# Patient Record
Sex: Female | Born: 1985 | Hispanic: Yes | Marital: Married | Smoking: Never smoker
Health system: Southern US, Community
[De-identification: ages and names within clinical notes are randomized; demographics above are authoritative.]

## PROBLEM LIST (undated history)

## (undated) DIAGNOSIS — R011 Cardiac murmur, unspecified: Secondary | ICD-10-CM

## (undated) DIAGNOSIS — N159 Renal tubulo-interstitial disease, unspecified: Secondary | ICD-10-CM

## (undated) DIAGNOSIS — A048 Other specified bacterial intestinal infections: Secondary | ICD-10-CM

## (undated) DIAGNOSIS — I10 Essential (primary) hypertension: Secondary | ICD-10-CM

## (undated) HISTORY — DX: Other specified bacterial intestinal infections: A04.8

## (undated) HISTORY — PX: APPENDECTOMY: SHX54

---

## 2013-06-04 ENCOUNTER — Emergency Department (HOSPITAL_COMMUNITY)
Admission: EM | Admit: 2013-06-04 | Discharge: 2013-06-04 | Disposition: A | Discharge status: Home / Self Care | Payer: Self-pay | Attending: Emergency Medicine | Admitting: Emergency Medicine

## 2013-06-04 ENCOUNTER — Encounter (HOSPITAL_COMMUNITY): Payer: Self-pay | Admitting: Emergency Medicine

## 2013-06-04 DIAGNOSIS — R51 Headache: Secondary | ICD-10-CM | POA: Insufficient documentation

## 2013-06-04 DIAGNOSIS — R197 Diarrhea, unspecified: Secondary | ICD-10-CM | POA: Insufficient documentation

## 2013-06-04 DIAGNOSIS — Z8742 Personal history of other diseases of the female genital tract: Secondary | ICD-10-CM | POA: Insufficient documentation

## 2013-06-04 DIAGNOSIS — R5383 Other fatigue: Secondary | ICD-10-CM

## 2013-06-04 DIAGNOSIS — Z880 Allergy status to penicillin: Secondary | ICD-10-CM | POA: Insufficient documentation

## 2013-06-04 DIAGNOSIS — IMO0001 Reserved for inherently not codable concepts without codable children: Secondary | ICD-10-CM | POA: Insufficient documentation

## 2013-06-04 DIAGNOSIS — R509 Fever, unspecified: Secondary | ICD-10-CM | POA: Insufficient documentation

## 2013-06-04 DIAGNOSIS — R5381 Other malaise: Secondary | ICD-10-CM | POA: Insufficient documentation

## 2013-06-04 DIAGNOSIS — R011 Cardiac murmur, unspecified: Secondary | ICD-10-CM | POA: Insufficient documentation

## 2013-06-04 DIAGNOSIS — J069 Acute upper respiratory infection, unspecified: Secondary | ICD-10-CM | POA: Insufficient documentation

## 2013-06-04 HISTORY — DX: Renal tubulo-interstitial disease, unspecified: N15.9

## 2013-06-04 HISTORY — DX: Cardiac murmur, unspecified: R01.1

## 2013-06-04 MED ORDER — LORATADINE-PSEUDOEPHEDRINE ER 5-120 MG PO TB12: 1.0000 | ORAL_TABLET | Freq: Two times a day (BID) | ORAL | Status: DC

## 2013-06-04 MED ORDER — HYDROCOD POLST-CHLORPHEN POLST 10-8 MG/5ML PO LQCR
5.0000 mL | Freq: Once | ORAL | Status: AC
Start: 2013-06-04 — End: 2013-06-04
  Administered 2013-06-04: 5 mL via ORAL
  Filled 2013-06-04: qty 5

## 2013-06-04 MED ORDER — IBUPROFEN 800 MG PO TABS
800.0000 mg | ORAL_TABLET | Freq: Once | ORAL | Status: AC
  Administered 2013-06-04: 800 mg via ORAL
  Filled 2013-06-04: qty 1

## 2013-06-04 MED ORDER — PROMETHAZINE-CODEINE 6.25-10 MG/5ML PO SYRP: 5.0000 mL | ORAL_SOLUTION | ORAL | Status: DC | PRN

## 2013-06-04 NOTE — ED Notes (Signed)
Flu like sx x 3 days with fever, cough, body aches, sore throat.

## 2013-06-04 NOTE — ED Provider Notes (Signed)
CSN: [id number]     Arrival date & time 06/04/13  1001 History   First MD Initiated Contact with Patient 06/04/13 1107     Chief Complaint  Patient presents with  . flu like sx    (Consider location/radiation/quality/duration/timing/severity/associated sxs/prior Treatment) Patient is a 28 y.o. female presenting with flu symptoms. The history is provided by the patient.  Influenza Presenting symptoms: cough, diarrhea, fatigue, fever, headache, myalgias and sore throat   Presenting symptoms: no shortness of breath   Severity:  Moderate Onset quality:  Gradual Duration:  3 days Progression:  Worsening Chronicity:  New Relieved by:  Nothing Ineffective treatments:  OTC medications Associated symptoms: chills, decreased appetite and nasal congestion   Risk factors: sick contacts     Past Medical History  Diagnosis Date  . Kidney infection   . Heart murmur     childhood   Past Surgical History  Procedure Laterality Date  . Appendectomy     No family history on file. History  Substance Use Topics  . Smoking status: Never Smoker   . Smokeless tobacco: Not on file  . Alcohol Use: No   OB History   Grav Para Term Preterm Abortions TAB SAB Ect Mult Living                 Review of Systems  Constitutional: Positive for fever, chills, fatigue and decreased appetite. Negative for activity change.       All ROS Neg except as noted in HPI  HENT: Positive for congestion and sore throat. Negative for nosebleeds.   Eyes: Negative for photophobia and discharge.  Respiratory: Positive for cough. Negative for shortness of breath and wheezing.   Cardiovascular: Negative for chest pain and palpitations.  Gastrointestinal: Positive for diarrhea. Negative for abdominal pain and blood in stool.  Genitourinary: Negative for dysuria, frequency and hematuria.  Musculoskeletal: Positive for myalgias. Negative for arthralgias, back pain and neck pain.  Skin: Negative.   Neurological: Positive  for headaches. Negative for dizziness, seizures and speech difficulty.  Psychiatric/Behavioral: Negative for hallucinations and confusion.    Allergies  Penicillins  Home Medications   Current Outpatient Rx  Name  Route  Sig  Dispense  Refill  . Pseudoeph-Doxylamine-DM-APAP (NYQUIL PO)   Oral   Take by mouth at bedtime as needed (cold/fever).          BP 120/57  Pulse 105  Temp(Src) 98 F (36.7 C) (Oral)  Resp 16  SpO2 100%  LMP 05/09/2013 Physical Exam  Nursing note and vitals reviewed. Constitutional: She is oriented to person, place, and time. She appears well-developed and well-nourished.  Non-toxic appearance.  HENT:  Head: Normocephalic.  Right Ear: Tympanic membrane and external ear normal.  Left Ear: Tympanic membrane and external ear normal.  Nasal congestion present  Uvula slightly enlarged. Airway patent  Eyes: EOM and lids are normal. Pupils are equal, round, and reactive to light.  Neck: Normal range of motion. Neck supple. Carotid bruit is not present.  Cardiovascular: Normal rate, regular rhythm, normal heart sounds, intact distal pulses and normal pulses.   Pulmonary/Chest: Breath sounds normal. No respiratory distress. She has no wheezes. She has no rales.  cpurse breath sounds. Pt speaks in complete sentences.  Abdominal: Soft. Bowel sounds are normal. There is no tenderness. There is no guarding.  Musculoskeletal: Normal range of motion.  Lymphadenopathy:       Head (right side): No submandibular adenopathy present.       Head (left side):  No submandibular adenopathy present.    She has no cervical adenopathy.  Neurological: She is alert and oriented to person, place, and time. She has normal strength. No cranial nerve deficit or sensory deficit.  Skin: Skin is warm and dry.  Psychiatric: She has a normal mood and affect. Her speech is normal.    ED Course  Procedures (including critical care time) Labs Review Labs Reviewed - No data to  display Imaging Review No results found.  EKG Interpretation   None       MDM  No diagnosis found. *I have reviewed nursing notes, vital signs, and all appropriate lab and imaging results for this patient.**  Vital signs stable. Suspect URI.Rx for claritin D and promethazine cough medication given. Pt will use tylenol or ibuprofen for fever and aching. She is to return if any changes or problem.  Kathie Dike, PA-C 06/06/13 1517

## 2013-06-04 NOTE — Discharge Instructions (Signed)
Infección de las vías aéreas superiores en los adultos  (Upper Respiratory Infection, Adult)   La infección respiratoria de las vías aéreas superiores se conoce también como resfrío común. Las vías aéreas superiores incluyen los senos nasales, la garganta, la tráquea, y los bronquios. Los bronquios son las vías aéreas que conducen el aire a los pulmones. La mayor parte de las personas mejora luego de una semana, pero los síntomas pueden durar hasta dos semanas. La tos residual puede durar más.  CAUSAS  Varios tipos de virus pueden causar la infección de los tejidos que cubren las vías aéreas superiores. Los tejidos se irritan y se inflaman y se originan secreciones. También es frecuente la producción de moco. El resfrío es contagioso. El virus se disemina fácilmente a otras personas por contacto oral. Aquí se incluyen los besos, el compartir un vaso y el toser o estornudar. También puede diseminarse tocándose la boca o la nariz y luego tocando una superficie que luego tocan otras personas.   SÍNTOMAS  Los síntomas se desarrollan entre uno y tres días luego de entrar en contacto con el virus. Pueden variar de una persona a otra. Incluyen:  · Secreción nasal.  · Estornudos  · Congestión nasal.  · Irritación de los senos nasales.  · Dolor de garganta.  · Pérdida de la voz (laringitis).  · Tos.  · Fatiga.  · Dolores musculares.  · Pérdida del apetito.  · Dolor de cabeza.  · Fiebre no muy elevada.  DIAGNÓSTICO  Puede diagnosticarse a sí mismo la infección respiratoria, según los síntomas habituales, ya que la mayor parte de las personas se resfría dos o tres veces al año. El profesional puede confirmarlo basándose en el examen físico. Lo más importante es que el profesional verifique que los síntomas no se deben a otra enfermedad como anginas, sinusitis, neumonía, asma o epiglotitis. Para diagnosticar el resfrio común, no es necesario que haga análisis de sangre, pruebas en la garganta o radiografías, pero en algunos  casos puede ser de utilidad para excluir otros problemas más graves. El médico decidirá si necesita otras pruebas.  RIESGOS Y COMPLICACIONES  Tendrá mayor riesgo de sufrir un resfrío grave si consume cigarrillos, sufre una enfermedad cardíaca (como insuficiencia cardíaca) o pulmonar crónica (como asma) o si tiene un debilitamiento del sistema inmunológico. Las personas muy jóvenes o muy mayores tienen riesgo de sufrir infecciones más graves. La sinusitis bacteriana, las infecciones del oído medio y la neumonía bacteriana pueden complicar el resfrío común. El resfrío puede exacerbar el asma y la enfermedad pulmonar obstructiva crónica. En algunos casos estas complicaciones requieren la atención en un servicio de emergencias y pueden poner en peligro la vida.  PREVENCIÓN  La mejor manera de protegerse para no contraer un resfrío es mantener una buena higiene. Evite el contacto bucal o de las manos con personas con síntomas de resfrío. Si se produce el contacto, lávese las manos con frecuencia. No hay pruebas firmes que indiquen que la vitamina C, la vitamina E, la equinácea o la actividad física reduzcan las posibilidades de tener una infección. Sin embargo, siempre se recomienda descansar mucho y tener una buena nutrición.  TRATAMIENTO  El tratamiento está dirigido a aliviar los síntomas. Esta enfermedad no tiene cura. Los antibióticos no son eficaces, ya que esta infección la causa un virus y no una bacteria. El tratamiento incluye:  · Aumente la ingesta de líquidos. Consumo de bebidas deportivas, que proporcionan electrolitos,azúcares e hidratación.  · Inhale vapor caliente (de un vaporizador o de   la ducha).  · Tomar sopa de pollo u otros líquidos claros, y mantener una buena nutrición.  · Descanse lo suficiente.  · Haga gárgaras o coma pastillas para aliviar las molestias.  · Control de la fiebre con ibuprofeno o acetaminofen, según las indicaciones del médico.  · Aumento del uso del inhalador, si sufre asma.  Las  pastillas y los geles de zinc durante las primeras 24 horas de iniciado el resfrío común, pueden disminuir la duración y aliviar la gravedad de los síntomas. Los medicamentos para el dolor pueden disminuir la fiebre, aliviar los dolores musculares y el dolor de garganta. Se dispone de una gran variedad de medicamentos de venta libre para tratar la congestión y la secreción nasal. El profesional podrá recomendarle inhalantes para los otros síntomas.  INSTRUCCIONES PARA EL CUIDADO DOMICILIARIO  · Utilice los medicamentos de venta libre o de prescripción para el dolor, el malestar o la fiebre, según se lo indique el profesional que lo asiste.  · Utilice un vaporizador caliente o inhale vapor, haciendo salir agua de la ducha para aumentar la humedad ambiente. Esto mantendrá las secreciones húmedas y le resultará más fácil respirar.  · Beba gran cantidad de líquido para mantener la orina de tono claro o color amarillo pálido.  · Descanse todo lo que pueda.  · Regrese a su trabajo cuando la temperatura se haya normalizado, o cuando el profesional que lo asiste se lo indique. Quizás sea necesario que permanezca en su casa durante un tiempo más prolongado para evitar infectar a otras personas. También puede utilizar un barbijo y ser cuidadoso con el lavado de manos para evitar la diseminación del virus.  SOLICITE ATENCIÓN MÉDICA SI:  · Luego de los primeros días siente que empeora en vez de mejorar.  · Necesita que el profesional le brinde más información relacionada con los medicamentos para controlar los síntomas.  · Siente escalofríos, le falta el aire o escupe moco de color marrón o rojo. Estos pueden ser síntomas de neumonía.  · Tiene una secreción nasal de color amarillo o marrón, o siente dolor en el rostro, especialmente cuando se inclina hacia adelante. Estos pueden ser síntomas de sinusitis.  · Tiene fiebre, siente el cuello hinchado, tiene dolor al tragar u observa manchas blancas en el fondo de la garganta.  Estos pueden ser síntomas de angina por estreptococo.  SOLICITE ATENCIÓN MÉDICA DE INMEDIATO SI:  · Tiene fiebre.  · Comienza a sentir un dolor de cabeza intenso o persistente, dolor de oídos, en el seno nasal o en el pecho.  · Tiene tos y esta se prolonga demasiado, tose y escupe sangre, la mucosidad habitual se modifica (si tiene una enfermedad pulmonar crónica) o respira con dificultad.  · Siente rigidez en el cuello o dolor de cabeza intenso.  Document Released: 02/02/2005 Document Revised: 07/18/2011  ExitCare® Patient Information ©2014 [employer].

## 2013-06-06 NOTE — ED Provider Notes (Signed)
Medical screening examination/treatment/procedure(s) were performed by non-physician practitioner and as supervising physician I was immediately available for consultation/collaboration.  EKG Interpretation   None         Laray AngerKathleen M Zuri Bradway, DO 06/06/13 2132

## 2014-12-20 ENCOUNTER — Inpatient Hospital Stay (HOSPITAL_COMMUNITY)
Admission: AD | Admit: 2014-12-20 | Discharge: 2014-12-20 | Disposition: A | Discharge status: Home / Self Care | Payer: Self-pay | Source: Ambulatory Visit | Attending: Obstetrics & Gynecology | Admitting: Obstetrics & Gynecology

## 2014-12-20 DIAGNOSIS — G43009 Migraine without aura, not intractable, without status migrainosus: Secondary | ICD-10-CM

## 2014-12-20 DIAGNOSIS — Z880 Allergy status to penicillin: Secondary | ICD-10-CM | POA: Insufficient documentation

## 2014-12-20 LAB — URINALYSIS, ROUTINE W REFLEX MICROSCOPIC
BILIRUBIN URINE: NEGATIVE
GLUCOSE, UA: NEGATIVE mg/dL
HGB URINE DIPSTICK: NEGATIVE
Ketones, ur: NEGATIVE mg/dL
NITRITE: NEGATIVE
Protein, ur: NEGATIVE mg/dL
UROBILINOGEN UA: 0.2 mg/dL (ref 0.0–1.0)
pH: 6 (ref 5.0–8.0)

## 2014-12-20 LAB — URINE MICROSCOPIC-ADD ON

## 2014-12-20 LAB — CBC
HEMATOCRIT: 38.9 % (ref 36.0–46.0)
HEMOGLOBIN: 13.1 g/dL (ref 12.0–15.0)
MCH: 32.4 pg (ref 26.0–34.0)
MCHC: 33.7 g/dL (ref 30.0–36.0)
MCV: 96.3 fL (ref 78.0–100.0)
Platelets: 250 10*3/uL (ref 150–400)
RBC: 4.04 MIL/uL (ref 3.87–5.11)
RDW: 12.9 % (ref 11.5–15.5)
WBC: 6.6 10*3/uL (ref 4.0–10.5)

## 2014-12-20 LAB — POCT PREGNANCY, URINE: PREG TEST UR: NEGATIVE

## 2014-12-20 MED ORDER — IBUPROFEN 600 MG PO TABS: 600.0000 mg | ORAL_TABLET | Freq: Four times a day (QID) | ORAL | Status: DC | PRN

## 2014-12-20 MED ORDER — PROMETHAZINE HCL 25 MG/ML IJ SOLN
25.0000 mg | INTRAMUSCULAR | Status: AC
  Administered 2014-12-20: 25 mg via INTRAMUSCULAR
  Filled 2014-12-20: qty 1

## 2014-12-20 MED ORDER — KETOROLAC TROMETHAMINE 60 MG/2ML IM SOLN
60.0000 mg | Freq: Once | INTRAMUSCULAR | Status: AC
  Administered 2014-12-20: 60 mg via INTRAMUSCULAR
  Filled 2014-12-20: qty 2

## 2014-12-20 NOTE — MAU Note (Signed)
Pt has had a headache since yesterday, fainted once yesterday and fell twice.  Headache still present and feels faint and dizzy.  Rates pain 8/10.  Denies and vaginal complaints and states she does not think she is pregnant.  Does not have a history of headaches.

## 2014-12-20 NOTE — Discharge Instructions (Signed)
Cefalea migrañosa °(Migraine Headache) °Una cefalea migrañosa es un dolor muy intenso y punzante en uno o ambos lados de la cabeza. Hable con su médico sobre los factores que pueden causar (desencadenar) las cefaleas migrañosas. °CUIDADOS EN EL HOGAR °· Tome solo los medicamentos según le haya indicado el médico. °· Cuando tenga la migraña, acuéstese en un cuarto oscuro y tranquilo °· Lleve un registro diario para averiguar si hay ciertas cosas que le provocan la cefalea migrañosa. Por ejemplo, escriba: °¨ Lo que usted come y bebe. °¨ Cuánto tiempo duerme. °¨ Algún cambio en su dieta o en los medicamentos. °· Beba menos alcohol. °· Si fuma, deje de hacerlo. °· Duerma lo suficiente. °· Disminuya todo tipo de estrés de la vida diaria. °· Mantenga las luces tenues si le molestan las luces brillantes o hacen que la migraña empeore. °SOLICITE AYUDA DE INMEDIATO SI:  °· La migraña empeora. °· Tiene fiebre. °· Presenta rigidez en el cuello. °· Tiene dificultad para ver. °· Sus músculos están débiles, o pierde el control muscular. °· Pierde el equilibrio o tiene problemas para caminar. °· Siente que se desvanece (debilidad) o se desmaya. °· Tiene malos síntomas que son diferentes a los primeros síntomas. °ASEGÚRESE DE QUE:  °· Comprende estas instrucciones. °· Controlará su afección. °· Recibirá ayuda de inmediato si no mejora o si empeora. °Document Released: 07/22/2008 Document Revised: 04/30/2013 °ExitCare® Patient Information ©2015 [employer]. This information is not intended to replace advice given to you by your health care provider. Make sure you discuss any questions you have with your health care provider. ° °

## 2014-12-20 NOTE — MAU Provider Note (Signed)
  History     CSN: [id number]  Arrival date and time: 12/20/14 0865   First Provider Initiated Contact with Patient 12/20/14 1012      Chief Complaint  Patient presents with  . Headache   HPI Alicia Gilbert 29 y.o. presents to MAU with headache and passing out.  The HA has been ongoing x 3 days.  Pain is bilat back of head, severe, with pulsations, worse with movement, associated with photophobia and phonophobia.    She passed out one time yesterday and stumbled to fall 2 times since.  She is unsure how long she was unconscious.  Someone caught her so she did not hurt herself.   She was feeling dizzy and with blurry vision prior to passing out and very severe pain in head.   She has been eating.  She has nausea and vomiting.  She is drinking well.   OB History    No data available      Past Medical History  Diagnosis Date  . Kidney infection   . Heart murmur     childhood    Past Surgical History  Procedure Laterality Date  . Appendectomy      No family history on file.  Social History  Substance Use Topics  . Smoking status: Never Smoker   . Smokeless tobacco: Not on file  . Alcohol Use: No    Allergies:  Allergies  Allergen Reactions  . Penicillins Hives and Rash    Leg pains    Prescriptions prior to admission  Medication Sig Dispense Refill Last Dose  . loratadine-pseudoephedrine (CLARITIN-D 12 HOUR) 5-120 MG per tablet Take 1 tablet by mouth 2 (two) times daily. (Patient not taking: Reported on 12/20/2014) 20 tablet 0   . promethazine-codeine (PHENERGAN WITH CODEINE) 6.25-10 MG/5ML syrup Take 5 mLs by mouth every 4 (four) hours as needed for cough. (Patient not taking: Reported on 12/20/2014) 150 mL 0     ROS Pertinent ROS in HPI.  All other systems are negative.   Physical Exam   Blood pressure 111/62, pulse 65, temperature 97.8 F (36.6 C), temperature source Oral, resp. rate 18.  Physical Exam  Constitutional: She is oriented to person, place,  and time. She appears well-developed and well-nourished. No distress.  HENT:  Head: Normocephalic and atraumatic.  Eyes: EOM are normal.  Neck: Normal range of motion.  Cardiovascular: Normal rate and normal heart sounds.   Respiratory: Effort normal and breath sounds normal. No respiratory distress.  Musculoskeletal: Normal range of motion.  Neurological: She is alert and oriented to person, place, and time. No cranial nerve deficit. She exhibits normal muscle tone. Coordination normal.  Skin: Skin is warm and dry.  Psychiatric: She has a normal mood and affect.    MAU Course  Procedures  MDM Description is classic for migraine.  Will treat with IM phenergan and IM Toradol.   Pt indicates 99% improvement in symptoms and requests discharge.  EKG essentially normal.    Assessment and Plan  A: 1. Migraine without aura and without status migrainosus, not intractable    P: Discharge to home Encourage good po hydration Ibuprofen for HA See PCP Patient may return to MAU as needed or if her condition were to change or worsen   Bertram Denver 12/20/2014, 10:13 AM

## 2015-04-16 ENCOUNTER — Encounter: Payer: Self-pay | Admitting: Family Medicine

## 2015-04-16 ENCOUNTER — Ambulatory Visit: Payer: Self-pay | Admitting: Family Medicine

## 2015-04-16 VITALS — BP 115/81 | HR 66 | Temp 96.9°F

## 2015-04-16 DIAGNOSIS — R55 Syncope and collapse: Secondary | ICD-10-CM

## 2015-04-16 DIAGNOSIS — H7293 Unspecified perforation of tympanic membrane, bilateral: Secondary | ICD-10-CM

## 2015-04-16 DIAGNOSIS — H663X3 Other chronic suppurative otitis media, bilateral: Secondary | ICD-10-CM

## 2015-04-16 LAB — GLUCOSE, POCT (MANUAL RESULT ENTRY): POC Glucose: 105 mg/dl — AB (ref 70–99)

## 2015-04-16 LAB — POCT HEMOGLOBIN: HEMOGLOBIN: 12.7 g/dL (ref 12.2–16.2)

## 2015-04-16 MED ORDER — OFLOXACIN 0.3 % OP SOLN: OPHTHALMIC | Status: DC

## 2015-04-16 MED ORDER — AZITHROMYCIN 250 MG PO TABS: ORAL_TABLET | ORAL | Status: DC

## 2015-04-16 NOTE — Progress Notes (Signed)
BP 115/81 mmHg  Pulse 66  Temp(Src) 96.9 F (36.1 C) (Oral)   Subjective:    Patient ID: Alicia Gilbert, female    DOB: 03/11/1986, 29 y.o.   MRN: [id number]  HPI: Alicia ConferCristina Deyarmin is a 29 y.o. female presenting on 04/16/2015 for Loss of Consciousness   HPI Syncope/headaches/ear pain Patient has been having daily headaches that last about an hour and happen multiple times a day. She also had left ear pain associated with it. 3 times including today in our office over the past year the headaches have gotten so bad that she gets weak and passes out from them. She denies any fevers or chills. She does admit to having ear pain that's worse on the left than the right. She thinks she may have had ear infections or ear aches about a year ago and that happened before all of this started. She denies any chest pain or shortness of breath. She does think she may of had some ear drainage or pus coming out of her ear about a year ago. She does not have insurance so she never saw Dr. then or now until today. She still has some small amounts of ear drainage but not as much as she had before. The headaches are bilateral temporal and feel like a sharp stabbing and then pulsating. She denies any vision issues or numbness or weakness on one side of her body more than the other.  Relevant past medical, surgical, family and social history reviewed and updated as indicated. Interim medical history since our last visit reviewed. Allergies and medications reviewed and updated.  Review of Systems  Constitutional: Negative for fever and chills.  HENT: Positive for ear discharge and ear pain. Negative for congestion, postnasal drip, rhinorrhea and sinus pressure.   Eyes: Negative for redness and visual disturbance.  Respiratory: Negative for chest tightness and shortness of breath.   Cardiovascular: Negative for chest pain and leg swelling.  Genitourinary: Negative for dysuria and difficulty urinating.    Musculoskeletal: Negative for back pain and gait problem.  Skin: Negative for rash.  Neurological: Positive for dizziness, syncope, weakness (Gen. weakness occurs occasionally but does not persist, no focal weakness) and headaches. Negative for speech difficulty, light-headedness and numbness.  Psychiatric/Behavioral: Negative for behavioral problems and agitation.  All other systems reviewed and are negative.   Per HPI unless specifically indicated above     Medication List       This list is accurate as of: 04/16/15  1:02 PM.  Always use your most recent med list.               azithromycin 250 MG tablet  Commonly known as:  ZITHROMAX  Take 2 the first day and then one each day after.     ibuprofen 600 MG tablet  Commonly known as:  ADVIL,MOTRIN  Take 1 tablet (600 mg total) by mouth every 6 (six) hours as needed.     ofloxacin 0.3 % ophthalmic solution  Commonly known as:  OCUFLOX  1 gota en cada oido 4 veces cada dia para 7 dias     paliperidone 3 MG 24 hr tablet  Commonly known as:  INVEGA  Take 3 mg by mouth daily.           Objective:    BP 115/81 mmHg  Pulse 66  Temp(Src) 96.9 F (36.1 C) (Oral)  Wt Readings from Last 3 Encounters:  [facility] for [facility]    Physical Exam  Constitutional: She is oriented to person, place, and time. She appears well-developed and well-nourished. No distress.  HENT:  Right Ear: Ear canal normal. There is drainage and tenderness. Tympanic membrane is injected, scarred, perforated and erythematous. A middle ear effusion is present. No decreased hearing is noted.  Left Ear: Ear canal normal. There is drainage and tenderness. Tympanic membrane is injected, scarred, perforated and erythematous. A middle ear effusion is present. No decreased hearing is noted.  Nose: Nose normal. Right sinus exhibits no maxillary sinus tenderness and no frontal sinus tenderness. Left sinus exhibits no maxillary sinus tenderness and no frontal sinus  tenderness.  Mouth/Throat: Uvula is midline, oropharynx is clear and moist and mucous membranes are normal.  Eyes: Conjunctivae and EOM are normal. Pupils are equal, round, and reactive to light.  Neck: Neck supple. No thyromegaly present.  Cardiovascular: Normal rate, regular rhythm, normal heart sounds and intact distal pulses.   No murmur heard. Pulmonary/Chest: Effort normal and breath sounds normal. No respiratory distress. She has no wheezes. She has no rales.  Musculoskeletal: Normal range of motion. She exhibits no edema or tenderness.  Lymphadenopathy:    She has no cervical adenopathy.  Neurological: She is alert and oriented to person, place, and time. She has normal strength. No cranial nerve deficit or sensory deficit. Coordination normal.  Skin: Skin is warm and dry. No rash noted. She is not diaphoretic.  Psychiatric: She has a normal mood and affect. Her behavior is normal.  Nursing note and vitals reviewed.   Results for orders placed or performed in visit on 04/16/15  POCT hemoglobin  Result Value Ref Range   Hemoglobin 12.7 12.2 - 16.2 g/dL  POCT glucose (manual entry)  Result Value Ref Range   POC Glucose 105 (A) 70 - 99 mg/dl      Assessment & Plan:   Problem List Items Addressed This Visit    None    Visit Diagnoses    Syncope and collapse    -  Primary    She gets really bad headaches that cause her to feel weak and lightheaded and then she passes out. 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 Treat with antibiotics and drops, return if not  improved    Relevant Medications    ofloxacin (OCUFLOX) 0.3 % ophthalmic solution    azithromycin (ZITHROMAX) 250 MG tablet        Follow up plan: Return if symptoms worsen or fail to improve.  Counseling provided for all of the vaccine components Orders Placed This Encounter  Procedures  . POCT hemoglobin  . POCT glucose (manual entry)    Arville Care, MD [facility] 04/16/2015, 1:02 PM

## 2015-04-20 ENCOUNTER — Telehealth: Payer: Self-pay | Admitting: Family Medicine

## 2015-04-20 NOTE — Telephone Encounter (Signed)
Contacted [facility], they stated they had not called us, [facility] [address] [facility] [place], phone number [phone].  [facility]ed [facility] [place] and they state they no longer need us, patient took the hard copy of the script and did not fill it there.

## 2015-07-09 ENCOUNTER — Ambulatory Visit: Payer: Self-pay | Admitting: Family Medicine

## 2015-07-10 ENCOUNTER — Encounter: Payer: Self-pay | Admitting: Family Medicine

## 2015-07-17 ENCOUNTER — Other Ambulatory Visit: Payer: Self-pay | Admitting: Family

## 2016-03-15 ENCOUNTER — Telehealth: Payer: Self-pay | Admitting: Family Medicine

## 2016-03-15 DIAGNOSIS — H663X3 Other chronic suppurative otitis media, bilateral: Secondary | ICD-10-CM

## 2016-03-15 DIAGNOSIS — H7293 Unspecified perforation of tympanic membrane, bilateral: Secondary | ICD-10-CM

## 2016-03-15 MED ORDER — AZITHROMYCIN 250 MG PO TABS: ORAL_TABLET | ORAL | 0 refills | Status: DC

## 2016-03-15 NOTE — Telephone Encounter (Signed)
Reading for strep pharyngitis.  Her husband is in clinic currently, her 2 children have strep pharyngitis. Her husband is being treated with amoxicillin. She has penicillin allergy.  Patient tolerated azithromycin once previously, there is interaction for QT prolongation with paliperidone.  Murtis SinkSam Bradshaw, MD [facility] 03/15/2016, 1:24 PM

## 2017-02-17 ENCOUNTER — Ambulatory Visit (INDEPENDENT_AMBULATORY_CARE_PROVIDER_SITE_OTHER): Payer: Medicaid Other | Admitting: Family Medicine

## 2017-02-17 ENCOUNTER — Encounter: Payer: Self-pay | Admitting: Family Medicine

## 2017-02-17 VITALS — BP 97/60 | HR 74 | Temp 97.6°F | Ht 63.0 in | Wt 173.6 lb

## 2017-02-17 DIAGNOSIS — H7292 Unspecified perforation of tympanic membrane, left ear: Secondary | ICD-10-CM

## 2017-02-17 DIAGNOSIS — H66001 Acute suppurative otitis media without spontaneous rupture of ear drum, right ear: Secondary | ICD-10-CM

## 2017-02-17 MED ORDER — AZITHROMYCIN 250 MG PO TABS: ORAL_TABLET | ORAL | 0 refills | Status: DC

## 2017-02-17 NOTE — Patient Instructions (Signed)
Great to see you!   Otitis media - Adultos (Otitis Media, Adult) La otitis media es la irritacin, dolor e inflamacin (hinchazn) en el espacio que se encuentra detrs del tmpano (odo medio). La causa puede ser Vella Raring o una infeccin. Generalmente aparece junto con un resfro. CUIDADOS EN EL HOGAR  Tome los medicamentos segn las indicaciones. Finalice la prescripcin completa, aunque se sienta mejor.  Solo tome medicamentos de venta libre o recetados para Chief Technology Officer, Dentist o fiebre, como le indique el mdico.  [place] a las consultas de control con el mdico, segn las indicaciones.  SOLICITE AYUDA SI:  Tiene otitis media slo en un odo o sangra por la nariz, o ambas cosas.  Advierte un bulto en el cuello.  No mejora luego de 3-5 das.  Empeora en lugar de mejorar.  SOLICITE AYUDA DE INMEDIATO SI:  Siente un dolor intenso y no lo [employer].  Tiene irritacin, hinchazn o dolor en el odo.  Presenta rigidez en el cuello.  No puede mover una parte de su rostro (parlisis).  Nota que el hueso que se encuentra detrs de su oreja le duele al tocarlo.  ASEGRESE DE QUE:  Comprende estas instrucciones.  Controlar su afeccin.  Recibir ayuda de inmediato si no mejora o si empeora.  Esta informacin no tiene Theme park manager el consejo del mdico. Asegrese de hacerle al mdico cualquier pregunta que tenga. Document Released: 05/28/2010 Document Revised: 05/16/2014 Document Reviewed: 11/20/2012 Elsevier Interactive Patient Education  2017 [employer].

## 2017-02-17 NOTE — Progress Notes (Signed)
   HPI  Patient presents today with right ear pain.  Pt was offered translator but declined, speaks broken english, her children helped   Patient states that she does have chronic intermittent ear pain. She has documentation of previously chronically ruptured TMs, her right TM appears intact to me today. She would like to see an ENT for further treatment of her left TM.  She describes 2 days of right ear pain, left ear itching. She has had some fever subjectively, also chills. She is 4 months pregnant.  She has a penicillin allergy.    PMH: Smoking status noted ROS: Per HPI  Objective: BP 97/60   Pulse 74   Temp 97.6 F (36.4 C) (Oral)   Ht  (1.6 m)   Wt 173 lb 9.6 oz (78.7 kg)   LMP  (LMP Unknown) Comment: Patient states she is 4 months  BMI 30.75 kg/m  Gen: NAD, alert, cooperative with exam HEENT: NCAT, ruptured left TM, right TM erythematous with loss of landmarks, no clear rupture CV: RRR, good S1/S2, no murmur Resp: CTABL, no wheezes, non-labored Ext: No edema, warm Neuro: Alert and oriented, No gross deficits  Assessment and plan:  # Acute suppurative otitis media, right-sided Treat with azithromycin Penicillin allergy. 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 Referral placed.     Orders Placed This Encounter  Procedures  . Ambulatory referral to ENT    Referral Priority:   Routine    Referral Type:   Consultation    Referral Reason:   Specialty Services Required    Referred to Provider:   Newman Pies, MD    Requested Specialty:   Otolaryngology    Number of Visits Requested:   1    Meds ordered this encounter  Medications  . azithromycin (ZITHROMAX) 250 MG tablet    Sig: Take 2 the first day and then one each day after.    Dispense:  6 tablet    Refill:  0    Murtis Sink, MD [place] [facility] 02/17/2017, 1:44 PM

## 2017-03-09 ENCOUNTER — Ambulatory Visit (INDEPENDENT_AMBULATORY_CARE_PROVIDER_SITE_OTHER): Payer: Self-pay | Admitting: Otolaryngology

## 2017-03-12 ENCOUNTER — Encounter (HOSPITAL_COMMUNITY): Payer: Self-pay | Admitting: *Deleted

## 2017-03-12 ENCOUNTER — Emergency Department (HOSPITAL_COMMUNITY)
Admission: EM | Admit: 2017-03-12 | Discharge: 2017-03-12 | Disposition: A | Discharge status: Home / Self Care | Payer: Medicaid Other | Attending: Emergency Medicine | Admitting: Emergency Medicine

## 2017-03-12 DIAGNOSIS — Z79899 Other long term (current) drug therapy: Secondary | ICD-10-CM | POA: Diagnosis not present

## 2017-03-12 DIAGNOSIS — O26899 Other specified pregnancy related conditions, unspecified trimester: Secondary | ICD-10-CM | POA: Diagnosis present

## 2017-03-12 DIAGNOSIS — Z3A21 21 weeks gestation of pregnancy: Secondary | ICD-10-CM | POA: Diagnosis not present

## 2017-03-12 DIAGNOSIS — H6691 Otitis media, unspecified, right ear: Secondary | ICD-10-CM | POA: Insufficient documentation

## 2017-03-12 DIAGNOSIS — O2342 Unspecified infection of urinary tract in pregnancy, second trimester: Secondary | ICD-10-CM | POA: Insufficient documentation

## 2017-03-12 DIAGNOSIS — N390 Urinary tract infection, site not specified: Secondary | ICD-10-CM

## 2017-03-12 DIAGNOSIS — R109 Unspecified abdominal pain: Secondary | ICD-10-CM | POA: Diagnosis not present

## 2017-03-12 DIAGNOSIS — Z3492 Encounter for supervision of normal pregnancy, unspecified, second trimester: Secondary | ICD-10-CM

## 2017-03-12 LAB — URINALYSIS, ROUTINE W REFLEX MICROSCOPIC
BILIRUBIN URINE: NEGATIVE
Glucose, UA: NEGATIVE mg/dL
HGB URINE DIPSTICK: NEGATIVE
KETONES UR: 5 mg/dL — AB
Nitrite: POSITIVE — AB
Protein, ur: 30 mg/dL — AB
SPECIFIC GRAVITY, URINE: 1.015 (ref 1.005–1.030)
pH: 5 (ref 5.0–8.0)

## 2017-03-12 MED ORDER — CEPHALEXIN 500 MG PO CAPS: 500.0000 mg | ORAL_CAPSULE | Freq: Four times a day (QID) | ORAL | 0 refills | Status: DC

## 2017-03-12 MED ORDER — CEPHALEXIN 500 MG PO CAPS
500.0000 mg | ORAL_CAPSULE | Freq: Once | ORAL | Status: AC
  Administered 2017-03-12: 500 mg via ORAL

## 2017-03-12 MED ORDER — CEPHALEXIN 500 MG PO CAPS
ORAL_CAPSULE | ORAL | Status: AC
  Filled 2017-03-12: qty 1

## 2017-03-12 MED ORDER — CIPROFLOXACIN HCL 250 MG PO TABS
ORAL_TABLET | ORAL | Status: AC
  Filled 2017-03-12: qty 2

## 2017-03-12 NOTE — ED Triage Notes (Signed)
Pt c/o right flank pain since yesterday and right ear pain; pt has been running a fever and took motrin last at 2130; pt has been on an antibiotic; pt has some urinary frequency; pt states she is [redacted] weeks pregnant and has some leaking of fluid when she vomits or coughs

## 2017-03-12 NOTE — ED Provider Notes (Addendum)
[facility] EMERGENCY DEPARTMENT Provider Note   CSN: [id number] Arrival date & time: 03/12/17  0108     History   Chief Complaint Chief Complaint  Patient presents with  . Flank Pain    HPI Alicia Gilbert is a 31 y.o. female.  The history is provided by the patient. A language interpreter was used.  Flank Pain   She complains of subjective fever and chills since this afternoon.  She has noted some urinary frequency and some stress incontinence.  If she coughs or sneezes, she wets herself a little bit.  There has been some nausea but no vomiting.  She denies dysuria.  She has also complaining of pain in her right ear.  She had been on antibiotics for an ear infection 2 weeks ago and it had been better until today.  Of note, she is pregnant at [redacted] weeks and is receiving prenatal care.  She denies any sick contacts.  Past Medical History:  Diagnosis Date  . Heart murmur    childhood  . Kidney infection     There are no active problems to display for this patient.   Past Surgical History:  Procedure Laterality Date  . APPENDECTOMY      OB History    Gravida Para Term Preterm AB Living   1             SAB TAB Ectopic Multiple Live Births                   Home Medications    Prior to Admission medications   Medication Sig Start Date End Date Taking? Authorizing Provider  azithromycin (ZITHROMAX) 250 MG tablet Take 2 the first day and then one each day after. 02/17/17   Elenora Gamma, MD  ibuprofen (ADVIL,MOTRIN) 600 MG tablet Take 1 tablet (600 mg total) by mouth every 6 (six) hours as needed. 12/20/14   Bertram Denver, PA-C    Family History History reviewed. No pertinent family history.  Social History Social History   Tobacco Use  . Smoking status: Never Smoker  . Smokeless tobacco: Never Used  Substance Use Topics  . Alcohol use: No  . Drug use: No     Allergies   Penicillins   Review of Systems Review of Systems    Genitourinary: Positive for flank pain.  All other systems reviewed and are negative.    Physical Exam Updated Vital Signs BP 116/64 (BP Location: Right Arm)   Pulse (!) 119   Temp 99.7 F (37.6 C) (Oral)   Resp 20   Ht 5\' 5"  (1.651 m)   Wt 77.6 kg (171 lb)   LMP  (LMP Unknown) Comment: Patient states she is 4 months  SpO2 97%   BMI 28.46 kg/m   Physical Exam  Nursing note and vitals reviewed.  32 year old female, resting comfortably and in no acute distress. Vital signs are significant for mild tachycardia which is actually normal for this stage of pregnancy. Oxygen saturation is 97%, which is normal. Head is normocephalic and atraumatic. PERRLA, EOMI. Oropharynx is clear.  Left tympanic membrane is normal.  Right tympanic membrane is faintly erythematous with slight bulging. Neck is nontender and supple without adenopathy or JVD. Back is nontender in the midline.  There is questionable right CVA tenderness. Lungs are clear without rales, wheezes, or rhonchi. Chest is nontender. Heart has regular rate and rhythm without murmur. Abdomen is gravid uterus with fundus at the umbilicus.  This is consistent with her known stage of pregnancy.  There are no other masses or hepatosplenomegaly and peristalsis is normoactive. Extremities have no cyanosis or edema, full range of motion is present. Skin is warm and dry without rash. Neurologic: Mental status is normal, cranial nerves are intact, there are no motor or sensory deficits.  ED Treatments / Results  Labs (all labs ordered are listed, but only abnormal results are displayed) Labs Reviewed  URINALYSIS, ROUTINE W REFLEX MICROSCOPIC - Abnormal; Notable for the following components:      Result Value   APPearance CLOUDY (*)    Ketones, ur 5 (*)    Protein, ur 30 (*)    Nitrite POSITIVE (*)    Leukocytes, UA LARGE (*)    Bacteria, UA FEW (*)    Squamous Epithelial / LPF 6-30 (*)    All other components within normal limits   URINE CULTURE    Procedures Procedures (including critical care time)  Medications Ordered in ED Medications  cephALEXin (KEFLEX) capsule 500 mg (500 mg Oral Given 03/12/17 0300)     Initial Impression / Assessment and Plan / ED Course  I have reviewed the triage vital signs and the nursing notes.  Pertinent lab results that were available during my care of the patient were reviewed by me and considered in my medical decision making (see chart for details).  Subjective fever without any fever actually being documented at triage.  Patient is nontoxic-appearing.  Mild tachycardia is present but within an acceptable range for second trimester pregnancy.  Urinalysis is obtained and does show positive nitrite.  Specimen is sent for culture and patient is started on cephalexin.  Also, she seems to have recurrence or inadequate treatment of otitis media.  She is referred to ENT for follow-up of her persistent otitis.  She is discharged with prescription for cephalexin.  Follow-up with her OB/GYN in 3 days, make an appointment for follow-up with ENT.  Final Clinical Impressions(s) / ED Diagnoses   Final diagnoses:  Urinary tract infection without hematuria, site unspecified  Right otitis media, unspecified otitis media type  Second trimester pregnancy    New Prescriptions This SmartLink is deprecated. Use AVSMEDLIST instead to display the medication list for a patient.   Dione BoozeGlick, Hollis Oh, MD 03/12/17 [id number]    Dione BoozeGlick, Linette Gunderson, MD 03/12/17 [id number]

## 2017-03-12 NOTE — ED Notes (Signed)
Patient was discharge during downtime, signed paper discharge summary.

## 2017-03-14 LAB — URINE CULTURE: Culture: >=100000 — AB

## 2017-03-15 ENCOUNTER — Telehealth: Payer: Self-pay | Admitting: *Deleted

## 2017-03-15 NOTE — Telephone Encounter (Signed)
Post ED Visit - Positive Culture Follow-up  Culture report reviewed by antimicrobial stewardship pharmacist:  []  Alicia Gilbert, Pharm.D. []  Alicia Gilbert, Pharm.D., BCPS AQ-ID []  Alicia Gilbert, Pharm.D., BCPS []  Alicia Gilbert, Pharm.D., BCPS []  Alicia Gilbert, [address] BoulevardPharm.D., BCPS, AAHIVP []  Alicia Gilbert, Pharm.D., BCPS, AAHIVP []  Alicia Gilbert, PharmD, BCPS []  Alicia Gilbert, PharmD, BCPS []  Alicia Gilbert, PharmD, BCPS Alicia Gilbert, PharmD  Positive urine culture Treated with Cephalexin, organism sensitive to the same and no further patient follow-up is required at this time.  Alicia Gilbert, Alicia Gilbert7/2018, 10:22 AM

## 2017-04-03 ENCOUNTER — Ambulatory Visit (INDEPENDENT_AMBULATORY_CARE_PROVIDER_SITE_OTHER): Payer: Medicaid Other | Admitting: Otolaryngology

## 2017-05-16 ENCOUNTER — Ambulatory Visit (INDEPENDENT_AMBULATORY_CARE_PROVIDER_SITE_OTHER): Payer: Medicaid Other | Admitting: *Deleted

## 2017-05-16 DIAGNOSIS — Z23 Encounter for immunization: Secondary | ICD-10-CM

## 2017-11-03 ENCOUNTER — Encounter: Payer: Self-pay | Admitting: Nurse Practitioner

## 2017-11-03 ENCOUNTER — Ambulatory Visit (INDEPENDENT_AMBULATORY_CARE_PROVIDER_SITE_OTHER): Payer: Self-pay | Admitting: Nurse Practitioner

## 2017-11-03 VITALS — BP 120/73 | HR 55 | Temp 97.2°F | Ht 65.0 in | Wt 182.0 lb

## 2017-11-03 DIAGNOSIS — W57XXXA Bitten or stung by nonvenomous insect and other nonvenomous arthropods, initial encounter: Secondary | ICD-10-CM

## 2017-11-03 DIAGNOSIS — S30861A Insect bite (nonvenomous) of abdominal wall, initial encounter: Secondary | ICD-10-CM

## 2017-11-03 MED ORDER — DOXYCYCLINE HYCLATE 100 MG PO TABS: 100.0000 mg | ORAL_TABLET | Freq: Two times a day (BID) | ORAL | 0 refills | Status: DC

## 2017-11-03 NOTE — Patient Instructions (Signed)
Informacin sobre la picadura de Civil engineer, contracting, en adultos Tick Bite Information, Adult Las garrapatas son insectos que Engineer, manufacturing. La mayora vive en arbustos y en zonas de pastos. Se trepan a las personas y [employer] pasan por all. Luego, pican. Algunas garrapatas portan grmenes que pueden enfermarlo. Cmo puedo evitar las picaduras de garrapatas?  Use un repelente de insectos que contenga un 20 % o ms de los ingredientes DEET, picaridina o [id number]. Aplique este repelente de insectos en: ? La piel. ? La parte superior de sus botas. ? Las piernas de sus [place]. ? Los extremos de [employer].  Si [place] un repelente de insectos que contiene el ingrediente permetrina, asegrese de seguir las instrucciones que se encuentran en el frasco. Aplquelo en: ? Vestimenta. ? Suministros. ? Botas. ? Tiendas de campaa o toldos.  Use mangas largas, pantalones largos y colores claros.  Coloque las piernas de sus pantalones dentro de los calcetines.  Permanezca en el medio del sendero.  Trate de no caminar por pastos altos.  Antes de entrar en su casa, verifique si tiene garrapatas en la ropa, el cabello y la piel. Asegrese de [employer] cabeza, el cuello, las [place], la cintura, la ingle y las articulaciones.  Verifique la presencia de [employer].  Una vez adentro: ? Lave la ropa de inmediato. ? Dchese de inmediato. ? Seque la ropa en un secador a alta temperatura durante 60 minutos o ms. Cul es el modo correcto de retirar una garrapata? Retire las garrapatas de la piel lo ms pronto posible.  Para retirar Neomia Dear garrapata que est trepndose por la piel: ? Leda Roys y retire la garrapata con un cepillo. ? Use una cinta adhesiva o un rodillo quita pelusas.  Para retirar una garrapata que est picando: ? Lvese las manos. ? Si tiene guantes de ltex, pngaselos. ? Use una pinza de cejas, una pinza curva o una herramienta para retirar garrapatas a fin de Best boy. Sujete la garrapata lo ms cerca posible de la piel y lo ms cerca posible de la cabeza de la garrapata. ? Jale suavemente hasta que la garrapata se desprenda.  Trate de [employer] cabeza de la garrapata unida al cuerpo.  No la retuerza ni la sacuda.  No la apriete ni la aplaste.  No intente quitar una garrapata con calor, alcohol, vaselina ni esmalte de uas. Cmo me deshago de una garrapata? A continuacin se detallan algunas maneras de deshacerse de una garrapata viva:  Coloque la garrapata en alcohol isoproplico.  Coloque la garrapata en una bolsa o un recipiente que pueda cerrar hermticamente.  Envuelva la garrapata en una cinta adhesiva bien apretada.  Tire la garrapata por el inodoro.  Comunquese con un mdico si:  Tiene sntomas de una enfermedad, por ejemplo: ? Dolor muscular, articular u seo. ? Tiene dificultad para caminar o mover las piernas. ? Presenta entumecimiento en las piernas. ? Incapacidad para moverse (parlisis). ? Presenta una erupcin cutnea roja que forma un crculo (erupcin ojo de buey). ? Presenta enrojecimiento e hinchazn en el lugar donde lo pic la garrapata. ? Fiebre. ? Devuelve (vomita) una y Laverda Page. ? Diarrea. ? Prdida de peso. ? Ganglios linfticos inflamados y sensibles al tacto. ? Falta de aire. ? Tos. ? Dolor en el vientre (dolor abdominal). ? Dolor de [place]. ? Presenta un cansancio fsico mayor de lo habitual. ? Un cambio en cun alerta (consciente) est. ? Confusin. Solicite ayuda de inmediato si:  No puede  retirar Neomia Dearuna garrapata.  Parte de [place] garrapata se rompe y [place]queda atascada en la piel.  Se siente peor. Resumen  Es posible que las garrapatas porten grmenes que pueden enfermarlo.  Para evitar picaduras de garrapatas, use mangas largas, pantalones largos y colores claros. Use repelente para insectos. Siga las instrucciones que se encuentran en el frasco.  Si la garrapata lo est picando, no  intente retirarla con calor, alcohol, vaselina ni esmalte de uas.  Use una pinza de cejas, una pinza curva o una herramienta para retirar garrapatas a fin de Risk managersujetar la garrapata. Jale suavemente hasta que la garrapata se desprenda. No la retuerza ni la sacuda. No la apriete ni la aplaste.  Si tiene sntomas, comunquese con un mdico. Esta informacin no tiene Theme park managercomo fin reemplazar el consejo del mdico. Asegrese de hacerle al mdico cualquier pregunta que tenga. Document Released: 01/18/2012 Document Revised: 09/07/2016 Document Reviewed: 09/07/2016 Elsevier Interactive Patient Education  [employer]2018 Elsevier Inc.

## 2017-11-03 NOTE — Progress Notes (Signed)
   Subjective:    Patient ID: Alicia Gilbert, female    DOB: 02/09/1986, 32 y.o.   MRN: [id number]   Chief Complaint: Tick Removal (itching all over, HA, bones ache)   HPI Patient comes in saying that she has had a tick on her side since Sunday. Se cannot get itt out. Area is red and sore. Patient is c/o body aches and headache    Review of Systems  Constitutional: Positive for fatigue and fever.  Respiratory: Negative.   Genitourinary: Negative.   Skin:       Tick bite right flank  Neurological: Negative.   Psychiatric/Behavioral: Negative.   All other systems reviewed and are negative.      Objective:   Physical Exam  Constitutional: She is oriented to person, place, and time. She appears well-developed and well-nourished. No distress.  Cardiovascular: Normal rate.  Pulmonary/Chest: Effort normal.  Neurological: She is alert and oriented to person, place, and time.  Skin: Skin is warm.  6cm annular area surrounding tick bite Head of tick removed  Psychiatric: She has a normal mood and affect. Her behavior is normal. Thought content normal.   BP 120/73 (BP Location: Left Arm)   Pulse (!) 55   Temp (!) 97.2 F (36.2 C) (Oral)   Ht 5\' 5"  (1.651 m)   Wt 182 lb (82.6 kg)   LMP  (LMP Unknown) Comment: Patient states she is 4 months  Breastfeeding? Unknown   BMI 30.29 kg/m         Assessment & Plan:  .

## 2018-10-21 ENCOUNTER — Emergency Department (HOSPITAL_COMMUNITY)
Admission: EM | Admit: 2018-10-21 | Discharge: 2018-10-21 | Disposition: A | Discharge status: Home / Self Care | Payer: Medicaid Other | Attending: Emergency Medicine | Admitting: Emergency Medicine

## 2018-10-21 ENCOUNTER — Encounter (HOSPITAL_COMMUNITY): Payer: Self-pay | Admitting: Emergency Medicine

## 2018-10-21 ENCOUNTER — Other Ambulatory Visit: Payer: Self-pay

## 2018-10-21 ENCOUNTER — Emergency Department (HOSPITAL_COMMUNITY): Payer: Medicaid Other

## 2018-10-21 DIAGNOSIS — N390 Urinary tract infection, site not specified: Secondary | ICD-10-CM | POA: Insufficient documentation

## 2018-10-21 DIAGNOSIS — I10 Essential (primary) hypertension: Secondary | ICD-10-CM | POA: Insufficient documentation

## 2018-10-21 HISTORY — DX: Essential (primary) hypertension: I10

## 2018-10-21 LAB — CBC WITH DIFFERENTIAL/PLATELET
Abs Immature Granulocytes: 0.01 10*3/uL (ref 0.00–0.07)
Basophils Absolute: 0 10*3/uL (ref 0.0–0.1)
Basophils Relative: 1 %
Eosinophils Absolute: 0.1 10*3/uL (ref 0.0–0.5)
Eosinophils Relative: 2 %
HCT: 39.8 % (ref 36.0–46.0)
Hemoglobin: 13.1 g/dL (ref 12.0–15.0)
Immature Granulocytes: 0 %
Lymphocytes Relative: 41 %
Lymphs Abs: 2.1 10*3/uL (ref 0.7–4.0)
MCH: 31.6 pg (ref 26.0–34.0)
MCHC: 32.9 g/dL (ref 30.0–36.0)
MCV: 96.1 fL (ref 80.0–100.0)
Monocytes Absolute: 0.3 10*3/uL (ref 0.1–1.0)
Monocytes Relative: 6 %
Neutro Abs: 2.7 10*3/uL (ref 1.7–7.7)
Neutrophils Relative %: 50 %
Platelets: 280 10*3/uL (ref 150–400)
RBC: 4.14 MIL/uL (ref 3.87–5.11)
RDW: 12.3 % (ref 11.5–15.5)
WBC: 5.3 10*3/uL (ref 4.0–10.5)
nRBC: 0 % (ref 0.0–0.2)

## 2018-10-21 LAB — URINALYSIS, ROUTINE W REFLEX MICROSCOPIC
Bilirubin Urine: NEGATIVE
Glucose, UA: NEGATIVE mg/dL
Ketones, ur: NEGATIVE mg/dL
Nitrite: NEGATIVE
Protein, ur: NEGATIVE mg/dL
Specific Gravity, Urine: 1.018 (ref 1.005–1.030)
pH: 6 (ref 5.0–8.0)

## 2018-10-21 LAB — COMPREHENSIVE METABOLIC PANEL
ALT: 21 U/L (ref 0–44)
AST: 20 U/L (ref 15–41)
Albumin: 3.9 g/dL (ref 3.5–5.0)
Alkaline Phosphatase: 50 U/L (ref 38–126)
Anion gap: 10 (ref 5–15)
BUN: 17 mg/dL (ref 6–20)
CO2: 25 mmol/L (ref 22–32)
Calcium: 8.7 mg/dL — ABNORMAL LOW (ref 8.9–10.3)
Chloride: 103 mmol/L (ref 98–111)
Creatinine, Ser: 0.59 mg/dL (ref 0.44–1.00)
GFR calc Af Amer: >60 mL/min (ref >60)
GFR calc non Af Amer: >60 mL/min (ref >60)
Glucose, Bld: 115 mg/dL — ABNORMAL HIGH (ref 70–99)
Potassium: 3.5 mmol/L (ref 3.5–5.1)
Sodium: 138 mmol/L (ref 135–145)
Total Bilirubin: 0.5 mg/dL (ref 0.3–1.2)
Total Protein: 7.4 g/dL (ref 6.5–8.1)

## 2018-10-21 LAB — POC URINE PREG, ED: Preg Test, Ur: NEGATIVE

## 2018-10-21 LAB — TROPONIN I: Troponin I: <0.03 ng/mL (ref <0.03)

## 2018-10-21 MED ORDER — SULFAMETHOXAZOLE-TRIMETHOPRIM 800-160 MG PO TABS: 1.0000 | ORAL_TABLET | Freq: Two times a day (BID) | ORAL | 0 refills | Status: AC

## 2018-10-21 MED ORDER — SULFAMETHOXAZOLE-TRIMETHOPRIM 800-160 MG PO TABS
1.0000 | ORAL_TABLET | Freq: Once | ORAL | Status: AC
  Administered 2018-10-21: 1 via ORAL
  Filled 2018-10-21: qty 1

## 2018-10-21 MED ORDER — METOCLOPRAMIDE HCL 5 MG/ML IJ SOLN
10.0000 mg | Freq: Once | INTRAMUSCULAR | Status: AC
  Administered 2018-10-21: 19:00:00 10 mg via INTRAMUSCULAR
  Filled 2018-10-21: qty 2

## 2018-10-21 MED ORDER — SODIUM CHLORIDE 0.9 % IV BOLUS
1000.0000 mL | Freq: Once | INTRAVENOUS | Status: AC
  Administered 2018-10-21: 1000 mL via INTRAVENOUS

## 2018-10-21 MED ORDER — KETOROLAC TROMETHAMINE 30 MG/ML IJ SOLN
30.0000 mg | Freq: Once | INTRAMUSCULAR | Status: AC
  Administered 2018-10-21: 30 mg via INTRAVENOUS
  Filled 2018-10-21: qty 1

## 2018-10-21 MED ORDER — DIPHENHYDRAMINE HCL 25 MG PO CAPS
25.0000 mg | ORAL_CAPSULE | Freq: Once | ORAL | Status: AC
  Administered 2018-10-21: 25 mg via ORAL
  Filled 2018-10-21: qty 1

## 2018-10-21 NOTE — ED Provider Notes (Signed)
[facility] EMERGENCY DEPARTMENT Provider Note   CSN: [id number] Arrival date & time: 10/21/18  1625     History   Chief Complaint Chief Complaint  Patient presents with   Headache    HPI Alicia Gilbert is a 33 y.o. female.     The history is provided by the patient. The history is limited by a language barrier. A language interpreter was used.  Headache Associated symptoms: dizziness, nausea, neck pain and vomiting   Associated symptoms: no abdominal pain, no cough, no diarrhea, no eye pain, no fever, no neck stiffness, no numbness, no photophobia, no sore throat and no weakness      Alicia Gilbert is a 33 y.o. female who presents to the Emergency Department complaining of right side and left frontal headache, intermittent for 3 days.  She describes the headache as throbbing and "comes and goes" and it is associated with positional dizziness, nausea, fatigue, and pain to her neck and across the top of her shoulders.  Vision is somewhat blurred while headache is present.  She endorses vomiting today and one episode of squeezing chest pain of short duration.  No shortness of breath.  She admits to hx of headaches, but states this headache has felt "different" that previous.  She has not taken any medication for her symptoms, she denies neck stiffness, syncope and abdominal pain.  No prescribed medications or drug use.      Past Medical History:  Diagnosis Date   Heart murmur    childhood   Hypertension    Kidney infection     There are no active problems to display for this patient.   Past Surgical History:  Procedure Laterality Date   APPENDECTOMY       OB History    Gravida  4   Para  3   Term  3   Preterm      AB  1   Living  3     SAB  1   TAB      Ectopic      Multiple      Live Births               Home Medications    Prior to Admission medications   Medication Sig Start Date End Date Taking? Authorizing Provider    doxycycline (VIBRA-TABS) 100 MG tablet Take 1 tablet (100 mg total) by mouth 2 (two) times daily. 1 po bid 11/03/17   Chevis Pretty, FNP    Family History History reviewed. No pertinent family history.  Social History Social History   Tobacco Use   Smoking status: Never Smoker   Smokeless tobacco: Never Used  Substance Use Topics   Alcohol use: No   Drug use: No     Allergies   Penicillins   Review of Systems Review of Systems  Constitutional: Negative for activity change, appetite change and fever.  HENT: Negative for facial swelling, sore throat and trouble swallowing.   Eyes: Positive for visual disturbance. Negative for photophobia and pain.  Respiratory: Negative for cough, chest tightness, shortness of breath and wheezing.   Cardiovascular: Positive for chest pain.  Gastrointestinal: Positive for nausea and vomiting. Negative for abdominal pain and diarrhea.  Endocrine: Negative for polydipsia and polyuria.  Genitourinary: Negative for difficulty urinating, dysuria and frequency.  Musculoskeletal: Positive for neck pain. Negative for gait problem and neck stiffness.  Skin: Negative for rash and wound.  Neurological: Positive for dizziness and headaches. Negative for syncope, facial  asymmetry, speech difficulty, weakness and numbness.  Psychiatric/Behavioral: Negative for confusion and decreased concentration. The patient is not nervous/anxious.      Physical Exam Updated Vital Signs BP 122/79 (BP Location: Right Arm)    Pulse 61    Temp 98.4 F (36.9 C) (Oral)    Resp 14    Ht 5\' 6"  (1.676 m)    Wt 79.4 kg    LMP 10/21/2018    SpO2 100%    BMI 28.26 kg/m   Physical Exam Vitals signs and nursing note reviewed.  Constitutional:      General: She is not in acute distress.    Appearance: She is well-developed. She is not ill-appearing.  HENT:     Head: Normocephalic.     Right Ear: Tympanic membrane and ear canal normal.     Left Ear: Tympanic  membrane and ear canal normal.     Mouth/Throat:     Mouth: Mucous membranes are moist.  Eyes:     Extraocular Movements: Extraocular movements intact.     Conjunctiva/sclera: Conjunctivae normal.     Pupils: Pupils are equal, round, and reactive to light.  Neck:     Musculoskeletal: Normal range of motion and neck supple. No neck rigidity, spinous process tenderness or muscular tenderness.     Trachea: Phonation normal.     Meningeal: Kernig's sign absent.  Cardiovascular:     Rate and Rhythm: Normal rate and regular rhythm.     Pulses: Normal pulses.  Pulmonary:     Effort: Pulmonary effort is normal. No respiratory distress.     Breath sounds: Normal breath sounds.  Abdominal:     General: There is no distension.     Palpations: Abdomen is soft.     Tenderness: There is no abdominal tenderness.  Musculoskeletal: Normal range of motion.  Skin:    General: Skin is warm.     Capillary Refill: Capillary refill takes less than 2 seconds.     Findings: No rash.  Neurological:     General: No focal deficit present.     Mental Status: She is alert.     GCS: GCS eye subscore is 4. GCS verbal subscore is 5. GCS motor subscore is 6.     Cranial Nerves: No cranial nerve deficit.     Sensory: Sensation is intact. No sensory deficit.     Motor: Motor function is intact. No weakness, abnormal muscle tone or pronator drift.     Coordination: Coordination is intact. Coordination normal.     Gait: Gait normal.     Deep Tendon Reflexes:     Reflex Scores:      Tricep reflexes are 2+ on the right side and 2+ on the left side.      Bicep reflexes are 2+ on the right side and 2+ on the left side.    Comments: CN III-XII  Intact.  Speech clear. nml finger nose and heel-shin testing  Psychiatric:        Thought Content: Thought content normal.      ED Treatments / Results  Labs (all labs ordered are listed, but only abnormal results are displayed) Labs Reviewed  COMPREHENSIVE METABOLIC  PANEL - Abnormal; Notable for the following components:      Result Value   Glucose, Bld 115 (*)    Calcium 8.7 (*)    All other components within normal limits  URINALYSIS, ROUTINE W REFLEX MICROSCOPIC - Abnormal; Notable for the following components:   APPearance  CLOUDY (*)    Hgb urine dipstick LARGE (*)    Leukocytes,Ua TRACE (*)    Bacteria, UA MANY (*)    All other components within normal limits  URINE CULTURE  CBC WITH DIFFERENTIAL/PLATELET  TROPONIN I  POC URINE PREG, ED    EKG    Radiology Dg Chest 2 View  Result Date: 10/21/2018 CLINICAL DATA:  Per ED note. Patient c/o headache x3 days. Per patient she developed redness right eye, dizziness, neck pain, with nausea and vomiting. Denies any sensitivity to light or sound. Denies any slurred speech, weakness on a certain side, or facial drooping. Patient states came today because she was so dizzy that she was unable to get out of bed. Per patient dizziness has improved but states generalized weakness. Hx of HTN EXAM: CHEST - 2 VIEW COMPARISON:  None. FINDINGS: The heart size and mediastinal contours are within normal limits. Both lungs are clear. No pleural effusion or pneumothorax. The visualized skeletal structures are unremarkable. IMPRESSION: Normal chest radiographs. Electronically Signed   By: Amie Portlandavid  Ormond M.D.   On: 10/21/2018 19:10   Ct Head Wo Contrast  Result Date: 10/21/2018 CLINICAL DATA:  Per ED note. Patient c/o headache x3 days. Per patient she developed redness right eye, dizziness, neck pain, with nausea and vomiting. Denies any sensitivity to light or sound. Denies any slurred speech, weakness on a certain side, or facial drooping. Patient states came today because she was so dizzy that she was unable to get out of bed. Per patient dizziness has improved but states generalized weakness. Hx of HTN EXAM: CT HEAD WITHOUT CONTRAST TECHNIQUE: Contiguous axial images were obtained from the base of the skull through the  vertex without intravenous contrast. COMPARISON:  None. FINDINGS: Brain: No evidence of acute infarction, hemorrhage, hydrocephalus, extra-axial collection or mass lesion/mass effect. Vascular: No hyperdense vessel or unexpected calcification. Skull: Normal. Negative for fracture or focal lesion. Sinuses/Orbits: Visualized globes and orbits are unremarkable. The visualized sinuses and mastoid air cells are clear. Other: None. IMPRESSION: Normal unenhanced CT scan of the brain. Electronically Signed   By: Amie Portlandavid  Ormond M.D.   On: 10/21/2018 19:09    Procedures Procedures (including critical care time)  Medications Ordered in ED Medications  sodium chloride 0.9 % bolus 1,000 mL (has no administration in time range)     Initial Impression / Assessment and Plan / ED Course  I have reviewed the triage vital signs and the nursing notes.  Pertinent labs & imaging results that were available during my care of the patient were reviewed by me and considered in my medical decision making (see chart for details).        Pt with multiple complaints including intermittent headache, vertigo, and nausea.  On further hx, she does admits to some urinary frequency and U/A shows likely cystitis.  Remaining work up reassuring.  No nuchal rigidity.  She reports feeling better after medications and vertiginous sx's resolved after IVF's.    I will tx with abx for UTI.  Urine culture is pending.  Pt requesting d/c home.  rx written for keflex.  Results, tx plan and return precautions discussed through spanish interpreter.      Final Clinical Impressions(s) / ED Diagnoses   Final diagnoses:  Urinary tract infection in female    ED Discharge Orders    None       Rosey Bathriplett, Randell Detter, PA-C 10/21/18 2029    Raeford RazorKohut, Stephen, MD 10/22/18 1614

## 2018-10-21 NOTE — Discharge Instructions (Addendum)
Take the antibiotic as directed until its finished.  Drink plenty of water.  Follow-up with your primary doctor for recheck.

## 2018-10-21 NOTE — ED Provider Notes (Signed)
EKG:  Rhythm: normal sinus Rate: 65 PR: 139 ms QRS: 98 ms QTc: 425 ms ST segments: normal    Alicia Manifold, MD 10/21/18 2030

## 2018-10-21 NOTE — ED Triage Notes (Addendum)
Patient c/o headache x3 days. Per patient she developed redness right eye, dizziness, neck pain, with nausea and vomiting. Denies any sensitivity to light or sound. Denies any slurred speech, weakness on a certain side, or facial drooping. Patient states came today because she was so dizzy that she was unable to get out of bed. Per patient dizziness has improved but states generalized weakness. Hx of HTN   Patient Hispanic, does not speak [place], Optometrist used. Verdis Frederickson [phone])

## 2018-10-24 LAB — URINE CULTURE: Culture: >=100000 — AB

## 2018-10-25 ENCOUNTER — Telehealth: Payer: Self-pay | Admitting: Emergency Medicine

## 2018-10-25 NOTE — Progress Notes (Signed)
ED Antimicrobial Stewardship Positive Culture Follow Up   Alicia Gilbert is an 33 y.o. female who presented to [facility] on 10/21/2018 with a chief complaint of HA x3d and urinary symptoms.   Chief Complaint  Patient presents with  . Headache    Recent Results (from the past 720 hour(s))  Urine culture     Status: Abnormal   Collection Time: 10/21/18  7:14 PM   Specimen: Urine, Clean Catch  Result Value Ref Range Status   Specimen Description   Final    URINE, CLEAN CATCH Performed at [facility], [address] [place], [place] [address]    Special Requests   Final    NONE Performed at [facility], [address] [place], [place] [address]    Culture (A)  Final    >=100,000 COLONIES/mL ESCHERICHIA COLI 50,000 COLONIES/mL GROUP B STREP(S.AGALACTIAE)ISOLATED TESTING AGAINST S. AGALACTIAE NOT ROUTINELY PERFORMED DUE TO PREDICTABILITY OF AMP/PEN/VAN SUSCEPTIBILITY. Performed at [facility], [place] [address], [place], [place] [address]    Report Status 10/24/2018 FINAL  Final   Organism ID, Bacteria ESCHERICHIA COLI (A)  Final      Susceptibility   Escherichia coli - MIC*    AMPICILLIN >=32 RESISTANT Resistant     CEFAZOLIN <=4 SENSITIVE Sensitive     CEFTRIAXONE <=1 SENSITIVE Sensitive     CIPROFLOXACIN <=0.25 SENSITIVE Sensitive     GENTAMICIN <=1 SENSITIVE Sensitive     IMIPENEM <=0.25 SENSITIVE Sensitive     NITROFURANTOIN <=16 SENSITIVE Sensitive     TRIMETH/SULFA >=320 RESISTANT Resistant     AMPICILLIN/SULBACTAM >=32 RESISTANT Resistant     PIP/TAZO <=4 SENSITIVE Sensitive     Extended ESBL NEGATIVE Sensitive     * >=100,000 COLONIES/mL ESCHERICHIA COLI    [x]  Treated with Bactrim, organism resistant to prescribed antimicrobial  New antibiotic prescription: Keflex 500mg  PO TID x 5d  ED Provider: Eugenia Mcalpine 10/25/2018, 9:30 AM Clinical Pharmacist Monday - Friday phone -  [phone] Saturday - Sunday phone -  [phone]

## 2018-10-25 NOTE — Telephone Encounter (Signed)
Post ED Visit - Positive Culture Follow-up: Successful Patient Follow-Up  Culture assessed and recommendations reviewed by:  []  Elenor Quinones, Pharm.D. []  Heide Guile, Pharm.D., BCPS AQ-ID []  Parks Neptune, Pharm.D., BCPS []  Alycia Rossetti, Pharm.D., BCPS []  [place], Pharm.D., BCPS, AAHIVP []  Legrand Como, Pharm.D., BCPS, AAHIVP []  Salome Arnt, PharmD, BCPS []  Johnnette Gourd, PharmD, BCPS []  Hughes Better, PharmD, BCPS []  Leeroy Cha, PharmD Bertis Ruddy PharmD  Positive urine culture  []  Patient discharged without antimicrobial prescription and treatment is now indicated [x]  Organism is resistant to prescribed ED discharge antimicrobial []  Patient with positive blood cultures  Changes discussed with ED provider: Fara Boros PA New antibiotic prescription d/c bactrim , Keflex 500mg  po tid x 5 days  Attempting to contact patient    Hazle Nordmann 10/25/2018, 12:31 PM

## 2019-01-09 ENCOUNTER — Encounter (HOSPITAL_COMMUNITY): Payer: Self-pay | Admitting: Emergency Medicine

## 2019-01-09 ENCOUNTER — Emergency Department (HOSPITAL_COMMUNITY)
Admission: EM | Admit: 2019-01-09 | Discharge: 2019-01-09 | Disposition: A | Discharge status: Home / Self Care | Payer: Self-pay | Attending: Emergency Medicine | Admitting: Emergency Medicine

## 2019-01-09 ENCOUNTER — Emergency Department (HOSPITAL_COMMUNITY): Payer: Self-pay

## 2019-01-09 DIAGNOSIS — R112 Nausea with vomiting, unspecified: Secondary | ICD-10-CM | POA: Insufficient documentation

## 2019-01-09 DIAGNOSIS — I10 Essential (primary) hypertension: Secondary | ICD-10-CM | POA: Insufficient documentation

## 2019-01-09 DIAGNOSIS — R1011 Right upper quadrant pain: Secondary | ICD-10-CM

## 2019-01-09 DIAGNOSIS — N3090 Cystitis, unspecified without hematuria: Secondary | ICD-10-CM | POA: Insufficient documentation

## 2019-01-09 LAB — URINALYSIS, ROUTINE W REFLEX MICROSCOPIC
Bilirubin Urine: NEGATIVE
Glucose, UA: NEGATIVE mg/dL
Hgb urine dipstick: NEGATIVE
Ketones, ur: NEGATIVE mg/dL
Nitrite: POSITIVE — AB
Protein, ur: NEGATIVE mg/dL
Specific Gravity, Urine: 1.014 (ref 1.005–1.030)
pH: 6 (ref 5.0–8.0)

## 2019-01-09 LAB — COMPREHENSIVE METABOLIC PANEL
ALT: 25 U/L (ref 0–44)
AST: 26 U/L (ref 15–41)
Albumin: 3.5 g/dL (ref 3.5–5.0)
Alkaline Phosphatase: 54 U/L (ref 38–126)
Anion gap: 12 (ref 5–15)
BUN: 8 mg/dL (ref 6–20)
CO2: 22 mmol/L (ref 22–32)
Calcium: 8.9 mg/dL (ref 8.9–10.3)
Chloride: 104 mmol/L (ref 98–111)
Creatinine, Ser: 0.75 mg/dL (ref 0.44–1.00)
GFR calc Af Amer: >60 mL/min (ref >60)
GFR calc non Af Amer: >60 mL/min (ref >60)
Glucose, Bld: 113 mg/dL — ABNORMAL HIGH (ref 70–99)
Potassium: 3.6 mmol/L (ref 3.5–5.1)
Sodium: 138 mmol/L (ref 135–145)
Total Bilirubin: 0.2 mg/dL — ABNORMAL LOW (ref 0.3–1.2)
Total Protein: 7 g/dL (ref 6.5–8.1)

## 2019-01-09 LAB — CBC
HCT: 36.6 % (ref 36.0–46.0)
Hemoglobin: 12.2 g/dL (ref 12.0–15.0)
MCH: 31.9 pg (ref 26.0–34.0)
MCHC: 33.3 g/dL (ref 30.0–36.0)
MCV: 95.6 fL (ref 80.0–100.0)
Platelets: 215 10*3/uL (ref 150–400)
RBC: 3.83 MIL/uL — ABNORMAL LOW (ref 3.87–5.11)
RDW: 13 % (ref 11.5–15.5)
WBC: 3.3 10*3/uL — ABNORMAL LOW (ref 4.0–10.5)
nRBC: 0 % (ref 0.0–0.2)

## 2019-01-09 LAB — I-STAT BETA HCG BLOOD, ED (MC, WL, AP ONLY): I-stat hCG, quantitative: <5 m[IU]/mL (ref <5)

## 2019-01-09 LAB — LIPASE, BLOOD: Lipase: 24 U/L (ref 11–51)

## 2019-01-09 MED ORDER — IBUPROFEN 600 MG PO TABS: 600.0000 mg | ORAL_TABLET | Freq: Four times a day (QID) | ORAL | 0 refills | Status: DC | PRN

## 2019-01-09 MED ORDER — SULFAMETHOXAZOLE-TRIMETHOPRIM 800-160 MG PO TABS
1.0000 | ORAL_TABLET | Freq: Once | ORAL | Status: AC
  Administered 2019-01-09: 11:00:00 1 via ORAL
  Filled 2019-01-09: qty 1

## 2019-01-09 MED ORDER — SODIUM CHLORIDE 0.9% FLUSH
3.0000 mL | Freq: Once | INTRAVENOUS | Status: AC
  Administered 2019-01-09: 3 mL via INTRAVENOUS

## 2019-01-09 MED ORDER — KETOROLAC TROMETHAMINE 15 MG/ML IJ SOLN
15.0000 mg | Freq: Once | INTRAMUSCULAR | Status: AC
  Administered 2019-01-09: 10:00:00 15 mg via INTRAVENOUS
  Filled 2019-01-09: qty 1

## 2019-01-09 MED ORDER — SULFAMETHOXAZOLE-TRIMETHOPRIM 800-160 MG PO TABS: 1.0000 | ORAL_TABLET | Freq: Two times a day (BID) | ORAL | 0 refills | Status: AC

## 2019-01-09 NOTE — ED Notes (Signed)
Pt went to [facility]  

## 2019-01-09 NOTE — ED Provider Notes (Signed)
[facility]ENC[facility] Provider Note   CSN: [id number] Arrival date & time: 01/09/19  0825     History   Chief Complaint Chief Complaint  Patient presents with  . Abdominal Pain   Spanish translator was used for this conversation.  HPI Alicia Gilbert is a 33 y.o. female with no significant past medical history presenting to the emergency department with right upper quadrant abdominal pain.  She reports acute onset of pain approximately 3 days ago.  She describes it as sharp and radiating towards her right flank.  She has never had pain like this before.  She is the pain has been constant but at times intensifies, but never goes away.  She reports a few episodes of vomiting but is able to tolerate p.o. fluids.  She reports she took milk of magnesium 2 days ago because she thought her pain was due to constipation, and now is had several episodes of diarrhea, which are nonbloody.  Nothing has improved her pain.  She denies fevers or chills.  She denies any history of kidney stones.  She denies any hematuria or dysuria.  She denies any vaginal discharge or new sexual partners.  She reports a family history of cholecystitis and cholecystectomy and her mother and father.  She reports allergies to penicillins.  She reports one prior UTI for which she was treated with doxycycline in June of this year.  She does not feel like she is having the same urinary symptoms.  She reports a surgical history of appendectomy only.    HPI  Past Medical History:  Diagnosis Date  . Heart murmur    childhood  . Hypertension   . Kidney infection     There are no active problems to display for this patient.   Past Surgical History:  Procedure Laterality Date  . APPENDECTOMY       OB History    Gravida  4   Para  3   Term  3   Preterm      AB  1   Living  3     SAB  1   TAB      Ectopic      Multiple      Live Births                Home Medications    Prior to Admission medications   Medication Sig Start Date End Date Taking? Authorizing Provider  doxycycline (VIBRA-TABS) 100 MG tablet Take 1 tablet (100 mg total) by mouth 2 (two) times daily. 1 po bid 11/03/17   Daphine DeutscherMartin, Mary-Margaret, FNP  ibuprofen (ADVIL) 600 MG tablet Take 1 tablet (600 mg total) by mouth every 6 (six) hours as needed. Take with food 01/09/19   Terald Sleeperrifan, Oneta Sigman J, MD  sulfamethoxazole-trimethoprim (BACTRIM DS) 800-160 MG tablet Take 1 tablet by mouth 2 (two) times daily for 3 days. 01/09/19 01/12/19  Terald Sleeperrifan, Jayant Kriz J, MD    Family History No family history on file.  Social History Social History   Tobacco Use  . Smoking status: Never Smoker  . Smokeless tobacco: Never Used  Substance Use Topics  . Alcohol use: No  . Drug use: No     Allergies   Penicillins   Review of Systems Review of Systems  Constitutional: Negative for chills and fever.  Eyes: Negative for pain and visual disturbance.  Respiratory: Negative for cough and shortness of breath.   Cardiovascular: Negative for chest pain and palpitations.  Gastrointestinal: Positive for abdominal pain, diarrhea, nausea and vomiting. Negative for abdominal distention, blood in stool and constipation.  Genitourinary: Positive for flank pain. Negative for difficulty urinating, dysuria, frequency, hematuria, vaginal bleeding, vaginal discharge and vaginal pain.  Musculoskeletal: Negative for arthralgias and myalgias.  Skin: Negative for color change and rash.  Neurological: Negative for seizures and syncope.  Psychiatric/Behavioral: Negative for agitation and confusion.  All other systems reviewed and are negative.    Physical Exam Updated Vital Signs BP 117/81   Pulse (!) 47   Temp 98 F (36.7 C) (Oral)   Resp 18   SpO2 100%   Physical Exam Vitals signs and nursing note reviewed.  Constitutional:      General: She is not in acute distress.    Appearance: She is well-developed.   HENT:     Head: Normocephalic and atraumatic.  Eyes:     Conjunctiva/sclera: Conjunctivae normal.  Neck:     Musculoskeletal: Neck supple.  Cardiovascular:     Rate and Rhythm: Normal rate and regular rhythm.     Heart sounds: No murmur.  Pulmonary:     Effort: Pulmonary effort is normal. No respiratory distress.     Breath sounds: Normal breath sounds.  Abdominal:     Palpations: Abdomen is soft.     Tenderness: There is abdominal tenderness. There is no right CVA tenderness, left CVA tenderness, guarding or rebound. Positive signs include Murphy's sign. Negative signs include McBurney's sign.  Skin:    General: Skin is warm and dry.  Neurological:     Mental Status: She is alert.  Psychiatric:        Mood and Affect: Mood normal.        Behavior: Behavior normal.      ED Treatments / Results  Labs (all labs ordered are listed, but only abnormal results are displayed) Labs Reviewed  COMPREHENSIVE METABOLIC PANEL - Abnormal; Notable for the following components:      Result Value   Glucose, Bld 113 (*)    Total Bilirubin 0.2 (*)    All other components within normal limits  CBC - Abnormal; Notable for the following components:   WBC 3.3 (*)    RBC 3.83 (*)    All other components within normal limits  URINALYSIS, ROUTINE W REFLEX MICROSCOPIC - Abnormal; Notable for the following components:   APPearance HAZY (*)    Nitrite POSITIVE (*)    Leukocytes,Ua TRACE (*)    Bacteria, UA MANY (*)    All other components within normal limits  LIPASE, BLOOD  I-STAT BETA HCG BLOOD, ED ([facility], [facility], [facility] ONLY)    EKG None  Radiology US Abdomen Limited Ruq  Result Date: 01/09/2019 CLINICAL DATA:  33 year old female with right upper quadrant abdominal pain x3 days. EXAM: ULTRASOUND ABDOMEN LIMITED RIGHT UPPER QUADRANT COMPARISON:  None. FINDINGS: Gallbladder: No gallstones or wall thickening visualized. No sonographic Murphy sign noted by sonographer. Common bile duct: Diameter: 5 mm  Liver: No focal lesion identified. Within normal limits in parenchymal echogenicity. Portal vein is patent on color Doppler imaging with normal direction of blood flow towards the liver. Other: None. IMPRESSION: Unremarkable right upper quadrant ultrasound. Electronically Signed   By: Anner Crete M.D.   On: 01/09/2019 10:22    Procedures Procedures (including critical care time)  Medications Ordered in ED Medications  sodium chloride flush (NS) 0.9 % injection 3 mL (3 mLs Intravenous Given 01/09/19 1031)  ketorolac (TORADOL) 15 MG/ML injection 15 mg (15  mg Intravenous Given 01/09/19 1028)  sulfamethoxazole-trimethoprim (BACTRIM DS) 800-160 MG per tablet 1 tablet (1 tablet Oral Given 01/09/19 1125)     Initial Impression / Assessment and Plan / ED Course  I have reviewed the triage vital signs and the nursing notes.  Pertinent labs & imaging results that were available during my care of the patient were reviewed by me and considered in my medical decision making (see chart for details).   Clinical Course as of Jan 09 1731  Wed Jan 09, 2019  0906 Nitrite(!): POSITIVE [MT]  1027 I spoke to the patient's wife Rayfield CitizenCaroline who confirms that the patient has been chronically SOB, she believes he is putting on fluid as he has put on some weight recently.  She agrees with a plan to work him up for infection and potential discharge home on PO antibiotics. 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   [MT]  1102 Unremarkable U/S, pain improved after toradol   [MT]  1118 Performed GU exam with chaperone  Exam performed with chaperone present. External: Normal external female genitalia. No lesions, rashes, drainage, or suspicious lymph nodes. Internal: No CMT. Cervix closed and without erythema. Minimal physiologic lochia. No adnexal tenderness, swelling, or masses. No lacerations.    [MT]  1127 With benign  exam and improvement of symptoms after toradol, okay to discharge home with tx for cystitis at this time.  Clinically she is well appearing and does not show signs of pyelonephritis or other intra-abdominal infection.  Will tx with bactrim x 3 days for simple cystitis.  I advised the patient (with spanish interpreter) to return to the ED if she begins having fevers, intractable vomiting, or worsening flank/abdominal pain, as these may be signs that her treatment isn't working.  She verbalized understanding.   [MT]    Clinical Course User Index [MT] Symphany Fleissner, Kermit BaloMatthew J, MD   33 year old female presenting to the emergency department with right upper quadrant abdominal pain for 3 days.  She has a strong family history of biliary disease and demonstrates a positive Murphy sign on exam.  I suspect her symptoms are related to her gallbladder.  I will order an ultrasound.  Her intake labs are still pending.  We also check a UA for signs of blood or infection.  She does not have any significant flank tenderness or fever to suggest pyelonephritis. She does not appear septic at this time.  She has no lower abdominal pain and no vaginal discharge or reported new sexual partners.  I have a very low suspicion for PID or ovarian torsion.  Differential also includes reflux versus pancreatitis versus kidney stone versus other  I have low suspicion for sepsis or bowel perforation or acute intra-abdominal surgical emergency at this time.  She appears quite comfortable in the exam.  However I will reevaluate her after her test come back.     Final Clinical Impressions(s) / ED Diagnoses   Final diagnoses:  Right upper quadrant abdominal pain  Cystitis    ED Discharge Orders         Ordered    ibuprofen (ADVIL) 600 MG tablet  Every 6 hours PRN     01/09/19 1124    sulfamethoxazole-trimethoprim (BACTRIM DS) 800-160 MG tablet  2 times daily     01/09/19 1124           Jaedah Lords,  Kermit Balo, MD 01/09/19 [phone]

## 2019-01-09 NOTE — ED Triage Notes (Signed)
Pt arrives to ED with c/o of right lower abd pain radiates into left flank. LMP June. Also N/v

## 2019-04-19 ENCOUNTER — Emergency Department (HOSPITAL_COMMUNITY): Payer: Self-pay

## 2019-04-19 ENCOUNTER — Other Ambulatory Visit: Payer: Self-pay

## 2019-04-19 ENCOUNTER — Emergency Department (HOSPITAL_COMMUNITY)
Admission: EM | Admit: 2019-04-19 | Discharge: 2019-04-19 | Disposition: A | Discharge status: Home / Self Care | Payer: Self-pay | Attending: Emergency Medicine | Admitting: Emergency Medicine

## 2019-04-19 ENCOUNTER — Encounter (HOSPITAL_COMMUNITY): Payer: Self-pay | Admitting: Emergency Medicine

## 2019-04-19 DIAGNOSIS — O10011 Pre-existing essential hypertension complicating pregnancy, first trimester: Secondary | ICD-10-CM | POA: Insufficient documentation

## 2019-04-19 DIAGNOSIS — O418X11 Other specified disorders of amniotic fluid and membranes, first trimester, fetus 1: Secondary | ICD-10-CM | POA: Insufficient documentation

## 2019-04-19 DIAGNOSIS — O209 Hemorrhage in early pregnancy, unspecified: Secondary | ICD-10-CM | POA: Insufficient documentation

## 2019-04-19 DIAGNOSIS — Z3A01 Less than 8 weeks gestation of pregnancy: Secondary | ICD-10-CM | POA: Insufficient documentation

## 2019-04-19 DIAGNOSIS — O2341 Unspecified infection of urinary tract in pregnancy, first trimester: Secondary | ICD-10-CM | POA: Insufficient documentation

## 2019-04-19 DIAGNOSIS — O458X1 Other premature separation of placenta, first trimester: Secondary | ICD-10-CM | POA: Insufficient documentation

## 2019-04-19 DIAGNOSIS — O418X10 Other specified disorders of amniotic fluid and membranes, first trimester, not applicable or unspecified: Secondary | ICD-10-CM

## 2019-04-19 DIAGNOSIS — Z3491 Encounter for supervision of normal pregnancy, unspecified, first trimester: Secondary | ICD-10-CM

## 2019-04-19 DIAGNOSIS — N3000 Acute cystitis without hematuria: Secondary | ICD-10-CM

## 2019-04-19 LAB — COMPREHENSIVE METABOLIC PANEL
ALT: 21 U/L (ref 0–44)
AST: 18 U/L (ref 15–41)
Albumin: 3.7 g/dL (ref 3.5–5.0)
Alkaline Phosphatase: 46 U/L (ref 38–126)
Anion gap: 7 (ref 5–15)
BUN: 9 mg/dL (ref 6–20)
CO2: 24 mmol/L (ref 22–32)
Calcium: 8.7 mg/dL — ABNORMAL LOW (ref 8.9–10.3)
Chloride: 105 mmol/L (ref 98–111)
Creatinine, Ser: 0.56 mg/dL (ref 0.44–1.00)
GFR calc Af Amer: >60 mL/min (ref >60)
GFR calc non Af Amer: >60 mL/min (ref >60)
Glucose, Bld: 106 mg/dL — ABNORMAL HIGH (ref 70–99)
Potassium: 3.9 mmol/L (ref 3.5–5.1)
Sodium: 136 mmol/L (ref 135–145)
Total Bilirubin: 0.6 mg/dL (ref 0.3–1.2)
Total Protein: 6.8 g/dL (ref 6.5–8.1)

## 2019-04-19 LAB — URINALYSIS, ROUTINE W REFLEX MICROSCOPIC
Bilirubin Urine: NEGATIVE
Glucose, UA: NEGATIVE mg/dL
Hgb urine dipstick: NEGATIVE
Ketones, ur: NEGATIVE mg/dL
Nitrite: POSITIVE — AB
Protein, ur: NEGATIVE mg/dL
Specific Gravity, Urine: 1.015 (ref 1.005–1.030)
pH: 7 (ref 5.0–8.0)

## 2019-04-19 LAB — WET PREP, GENITAL
Clue Cells Wet Prep HPF POC: NONE SEEN
Sperm: NONE SEEN
Trich, Wet Prep: NONE SEEN
WBC, Wet Prep HPF POC: NONE SEEN
Yeast Wet Prep HPF POC: NONE SEEN

## 2019-04-19 LAB — CBC
HCT: 37 % (ref 36.0–46.0)
Hemoglobin: 12.3 g/dL (ref 12.0–15.0)
MCH: 32.3 pg (ref 26.0–34.0)
MCHC: 33.2 g/dL (ref 30.0–36.0)
MCV: 97.1 fL (ref 80.0–100.0)
Platelets: 231 10*3/uL (ref 150–400)
RBC: 3.81 MIL/uL — ABNORMAL LOW (ref 3.87–5.11)
RDW: 12.2 % (ref 11.5–15.5)
WBC: 6.9 10*3/uL (ref 4.0–10.5)
nRBC: 0 % (ref 0.0–0.2)

## 2019-04-19 LAB — HIV ANTIBODY (ROUTINE TESTING W REFLEX): HIV Screen 4th Generation wRfx: NONREACTIVE

## 2019-04-19 LAB — I-STAT BETA HCG BLOOD, ED (MC, WL, AP ONLY): I-stat hCG, quantitative: >2000 m[IU]/mL — ABNORMAL HIGH (ref <5)

## 2019-04-19 LAB — HCG, QUANTITATIVE, PREGNANCY: hCG, Beta Chain, Quant, S: 88179 m[IU]/mL — ABNORMAL HIGH (ref <5)

## 2019-04-19 LAB — LIPASE, BLOOD: Lipase: 26 U/L (ref 11–51)

## 2019-04-19 MED ORDER — AZITHROMYCIN 250 MG PO TABS
1000.0000 mg | ORAL_TABLET | Freq: Once | ORAL | Status: AC
  Administered 2019-04-19: 16:00:00 1000 mg via ORAL
  Filled 2019-04-19: qty 4

## 2019-04-19 MED ORDER — ACETAMINOPHEN 325 MG PO TABS
650.0000 mg | ORAL_TABLET | Freq: Once | ORAL | Status: AC
  Administered 2019-04-19: 650 mg via ORAL
  Filled 2019-04-19: qty 2

## 2019-04-19 MED ORDER — NITROFURANTOIN MONOHYD MACRO 100 MG PO CAPS: 100.0000 mg | ORAL_CAPSULE | Freq: Two times a day (BID) | ORAL | 0 refills | Status: AC

## 2019-04-19 MED ORDER — STERILE WATER FOR INJECTION IJ SOLN
INTRAMUSCULAR | Status: AC
  Administered 2019-04-19: 10 mL
  Filled 2019-04-19: qty 10

## 2019-04-19 MED ORDER — CEFTRIAXONE SODIUM 250 MG IJ SOLR
250.0000 mg | Freq: Once | INTRAMUSCULAR | Status: AC
  Administered 2019-04-19: 16:00:00 250 mg via INTRAMUSCULAR
  Filled 2019-04-19: qty 250

## 2019-04-19 NOTE — Discharge Instructions (Addendum)
You have been diagnosed today with vaginal bleeding in the first trimester, urinary tract infection, small subchorionic hemorrhage.  At this time there does not appear to be the presence of an emergent medical condition, however there is always the potential for conditions to change. Please read and follow the below instructions.  Please return to the Emergency Department immediately for any new or worsening symptoms. Please be sure to follow up with your Primary Care Provider within one week regarding your visit today; please call their office to schedule an appointment even if you are feeling better for a follow-up visit. Please call your OB/GYN's office today to schedule a follow-up appointment as soon as possible to discuss your pregnancy.  A subchorionic hemorrhage was noted, your OB/GYN needs to be aware of this so be sure to discuss it with them at your follow-up visit. It appears you are 6 weeks and 1 day pregnant as of today.  Inform both your OB/GYN and your primary care provider today of this. Your urine shows evidence of infection today, you have been sent a prescription called Macrobid to your pharmacy, take this as prescribed. Drink plenty of water and get plenty of rest. Avoid NSAID medications such as ibuprofen with pregnancy.  Get help right away if: You have very bad cramps in your back or belly (abdomen). You pass large clots or a lot of tissue from your vagina. Your bleeding gets worse. You feel light-headed. You feel weak. You pass out (faint). You have chills. Have a fever. Have nausea and vomiting. Have back or side pain. Feel contractions in your uterus. Have lower belly pain. Have blood in your urine. You are leaking fluid from your vagina. You have a gush of fluid from your vagina. You have any new/concerning or worsening of symptoms  Please read the additional information packets attached to your discharge summary.  Do not take your medicine if  develop an  itchy rash, swelling in your mouth or lips, or difficulty breathing; call 911 and seek immediate emergency medical attention if this occurs.  Note: Portions of this text may have been transcribed using voice recognition software. Every effort was made to ensure accuracy; however, inadvertent computerized transcription errors may still be present. ============ The following has been translated using Google translate.  Errors may be present.  Interpret with caution.   Lo siguiente se ha traducido con Microbiologist. Puede haber errores. Interprete con precaucin. ============ Hoy le han diagnosticado sangrado vaginal en el primer trimestre, infeccin del tracto urinario, pequea hemorragia subcorinica.  En este momento no parece haber la presencia de una afeccin mdica emergente, sin embargo, siempre existe la posibilidad de que las afecciones [place]. [place] y [place] instrucciones a continuacin.  1. Regrese al Departamento de Emergencias de inmediato si tiene sntomas nuevos o que [place]. 2. Asegrese de hacer un seguimiento con su Proveedor de atencin primaria dentro de una semana con respecto a su visita de hoy; por favor llame a su oficina para programar una cita incluso si se siente mejor para una visita de seguimiento. 3. Llame al consultorio de su obstetra / gineclogo hoy para programar una cita de seguimiento lo antes posible para hablar Theatre stage manager. Se not una hemorragia subcorinica, su obstetra / gineclogo debe estar al tanto de esto, as que asegrese de discutirlo con ellos en su visita de seguimiento. 4. Parece que tiene 6 semanas y [place] a Proofreader de [employer]. Informe a su obstetra / gineclogo  y a su proveedor de atencin primaria hoy mismo. 5. Su orina muestra evidencia de infeccin hoy, le han enviado una receta llamada Macrobid a su farmacia, tmela segn lo prescrito. 6. Birdie Hopes agua y descanse lo suficiente. 7. Evite los medicamentos AINE como el  ibuprofeno durante el [place].  Obtenga ayuda de inmediato si: ? Tiene calambres muy fuertes en la espalda o el abdomen (abdomen). ? Elimina grandes cogulos o mucho tejido de la vagina. ? Su sangrado empeora. ? Se siente mareado. ? Te sientes dbil. ? Te desmayas (desmayo). ? Tienes escalofros. ? Tiene fiebre. ? Tiene nuseas y vmitos. ? Tiene dolor de espalda o de costado. ? Sienta las contracciones en su tero. ? Tiene dolor en la parte baja del abdomen. ? Tiene sangre en la orina. ? Est perdiendo lquido por la vagina. ? Tiene un chorro de lquido de la vagina. ? Tiene sntomas nuevos / preocupantes o que empeoran  Lea los paquetes de informacin adicional adjuntos a su resumen de alta.  No tome su medicamento si desarrolla un sarpullido con picazn, hinchazn en su boca o labios, o dificultad para respirar; Llame al 911 y busque atencin mdica de emergencia inmediata si esto ocurre.  Nota: Es posible que algunas partes de este texto se hayan transcrito con un software de reconocimiento de voz. Se hizo todo lo posible para garantizar la precisin; sin embargo, an pueden estar presentes errores de transcripcin computarizados inadvertidos.

## 2019-04-19 NOTE — ED Provider Notes (Signed)
[facility]-EMERGENCY DEPT Provider Note   CSN: [id number] Arrival date & time: 04/19/19  1229     History Chief Complaint  Patient presents with  . Abdominal Pain    Alicia Gilbert is a 33 y.o. female with past medical history significant for hypertension presents to emergency department today with chief complaint of abdominal pain and headache x2 days.  She states pain is located in suprapubic area  She describes it as an aching sensation, she rates it 7 out of 10 in severity.  Pain does not radiate.  She is also reporting urinary frequency and vaginal bleeding.  She describes vaginal bleeding as seeing blood on the toilet tissue when wiping.  She states it has been minimal amount, denies passing any clots.. Her last LMP was in mid November.   She describes her headache as located on left side of her head. It fees like headaches she has had in the past. She states headache has progressively worsened since onset, denies sudden onset. She rates pain 2/10 in severity. She has not taken any medications for her symptoms prior to arrival.   She denies any pelvic pain, vaginal discharge, fever, syncope, head trauma, photophobia, phonophobia, UL throbbing, N/V, visual changes, stiff neck, neck pain, rash, or "thunderclap" onset, flank pain. She is not concerned for STIs.   Due to language barrier, a video interpreter was present during the history-taking and subsequent discussion (and for part of the physical exam) with this patient.  Past Medical History:  Diagnosis Date  . Heart murmur    childhood  . Hypertension   . Kidney infection     There are no problems to display for this patient.   Past Surgical History:  Procedure Laterality Date  . APPENDECTOMY       OB History    Gravida  4   Para  3   Term  3   Preterm      AB  1   Living  3     SAB  1   TAB      Ectopic      Multiple      Live Births              No family  history on file.  Social History   Tobacco Use  . Smoking status: Never Smoker  . Smokeless tobacco: Never Used  Substance Use Topics  . Alcohol use: No  . Drug use: No    Home Medications Prior to Admission medications   Medication Sig Start Date End Date Taking? Authorizing Provider  doxycycline (VIBRA-TABS) 100 MG tablet Take 1 tablet (100 mg total) by mouth 2 (two) times daily. 1 po bid 11/03/17   Daphine DeutscherMartin, Mary-Margaret, FNP  ibuprofen (ADVIL) 600 MG tablet Take 1 tablet (600 mg total) by mouth every 6 (six) hours as needed. Take with food 01/09/19   Terald Sleeperrifan, Matthew J, MD    Allergies    Penicillins  Review of Systems   Review of Systems All other systems are reviewed and are negative for acute change except as noted in the HPI.  Physical Exam Updated Vital Signs BP 123/69 (BP Location: Left Arm)   Pulse 74   Temp 98 F (36.7 C) (Oral)   Resp 18   LMP 03/29/2019   SpO2 100%   Physical Exam Vitals and nursing note reviewed.  Constitutional:      General: She is not in acute distress.    Appearance: She is  not ill-appearing.  HENT:     Head: Normocephalic and atraumatic.     Comments: No sinus or temporal tenderness.    Right Ear: Tympanic membrane and external ear normal.     Left Ear: Tympanic membrane and external ear normal.     Nose: Nose normal.     Mouth/Throat:     Mouth: Mucous membranes are moist.     Pharynx: Oropharynx is clear.  Eyes:     General: No scleral icterus.       Right eye: No discharge.        Left eye: No discharge.     Extraocular Movements: Extraocular movements intact.     Conjunctiva/sclera: Conjunctivae normal.     Pupils: Pupils are equal, round, and reactive to light.  Neck:     Vascular: No JVD.     Comments: No meningeal signs Cardiovascular:     Rate and Rhythm: Normal rate and regular rhythm.     Pulses: Normal pulses.          Radial pulses are 2+ on the right side and 2+ on the left side.     Heart sounds: Normal  heart sounds.  Pulmonary:     Comments: Lungs clear to auscultation in all fields. Symmetric chest rise. No wheezing, rales, or rhonchi. Abdominal:     Tenderness: There is no right CVA tenderness or left CVA tenderness.     Comments: Abdomen is soft, non-distended.  Suprapubic tenderness on exam.  No rigidity, no guarding. No peritoneal signs.  Genitourinary:    Comments: Normal external genitalia. No pain with speculum insertion. Closed cervical os with normal appearance - no rash or lesions. No significant discharge or bleeding noted from cervix or in vaginal vault. On bimanual examination no adnexal tenderness or cervical motion tenderness. Chaperone Melody present during exam.  Musculoskeletal:        General: Normal range of motion.     Cervical back: Normal range of motion.  Skin:    General: Skin is warm and dry.     Capillary Refill: Capillary refill takes less than 2 seconds.     Findings: No rash.  Neurological:     Mental Status: She is oriented to person, place, and time.     GCS: GCS eye subscore is 4. GCS verbal subscore is 5. GCS motor subscore is 6.     Comments: Speech is clear and goal oriented, follows commands CN III-XII intact, no facial droop Normal strength in upper and lower extremities bilaterally including dorsiflexion and plantar flexion, strong and equal grip strength Sensation normal to light and sharp touch Moves extremities without ataxia, coordination intact Normal finger to nose and rapid alternating movements Normal gait and balance   Psychiatric:        Behavior: Behavior normal.     ED Results / Procedures / Treatments   Labs (all labs ordered are listed, but only abnormal results are displayed) Labs Reviewed  COMPREHENSIVE METABOLIC PANEL - Abnormal; Notable for the following components:      Result Value   Glucose, Bld 106 (*)    Calcium 8.7 (*)    All other components within normal limits  CBC - Abnormal; Notable for the following  components:   RBC 3.81 (*)    All other components within normal limits  URINALYSIS, ROUTINE W REFLEX MICROSCOPIC - Abnormal; Notable for the following components:   APPearance HAZY (*)    Nitrite POSITIVE (*)    Leukocytes,Ua TRACE (*)  Bacteria, UA RARE (*)    All other components within normal limits  HCG, QUANTITATIVE, PREGNANCY - Abnormal; Notable for the following components:   hCG, Beta Chain, Quant, S 88,179 (*)    All other components within normal limits  I-STAT BETA HCG BLOOD, ED (MC, [facility], AP ONLY) - Abnormal; Notable for the following components:   I-stat hCG, quantitative >2,000.0 (*)    All other components within normal limits  WET PREP, GENITAL  URINE CULTURE  LIPASE, BLOOD  RPR  HIV ANTIBODY (ROUTINE TESTING W REFLEX)  ABO/RH  GC/CHLAMYDIA PROBE AMP ([place]) NOT AT [facility]    EKG None  Radiology No results found.  Procedures Procedures (including critical care time)  Medications Ordered in ED Medications - No data to display  ED Course  I have reviewed the triage vital signs and the nursing notes.  Pertinent labs & imaging results that were available during my care of the patient were reviewed by me and considered in my medical decision making (see chart for details).    MDM Rules/Calculators/A&P     CHA2DS2/VAS Stroke Risk Points      N/A >= 2 Points: High Risk  1 - 1.99 Points: Medium Risk  0 Points: Low Risk    A final score could not be computed because of missing components.: Last  Change: N/A   This score determines the patient's risk of having a stroke if the patient has atrial fibrillation. This score is not applicable to this patient. Components are not calculated.   Patient seen and examined.  She is afebrile with normal vital signs, no tachycardia, no hypoxia.  On my exam she has abdominal tenderness to suprapubic area without peritoneal signs.  No CVA tenderness.  Neuro exam normal without focal deficit.  No sinus tenderness as well.   Patient's labs ordered in triage.  I viewed these results.  Beta-hCG patient is pregnant.  UA with signs of infection including positive nitrites and trace leukocytes.  Urine culture sent.  No leukocytosis, no anemia, no severe electrolyte derangement, no renal insufficiency.  Lipase is within normal range.  hCG quant ordered and is 88,179. ABO/Rh pending.  Pelvic exam performed and there is no bleeding seen.  Cervical os is closed.  No cervical motion or adnexal tenderness.  GC culture sent as well as HIV and RPR blood test collected.  Wet prep is negative.  OB ultrasound ordered given pregnancy and vaginal bleeding to rule out ectopic.  Tylenol given for headache.  Discussed with pharmacist Larkin Ina who recommends macrobid for UTI and GC to be treated with ceftriaxone and azithromycin.  Patient has an allergy to penicillins with documentation of hives and rash.  Chart review shows she has had azithromycin and Keflex in the past.   Patient care transferred to B. Morelli PA-C at the end of my shift to follow up on labs and [place]. Patient presentation, ED course, and plan of care discussed with review of all pertinent labs and imaging. Please see his note for further details regarding further ED course and disposition.    Final Clinical Impression(s) / ED Diagnoses Final diagnoses:  Vaginal bleeding affecting early pregnancy    Rx / DC Orders ED Discharge Orders    None       Cherre Robins, PA-C 04/19/19 1607    Ezequiel Essex, MD 04/19/19 1726

## 2019-04-19 NOTE — ED Triage Notes (Addendum)
Patient reports abdominal pain with N/V/D and headache x2 days. Reports Covid diagnosis x2 months ago. Patient is spanish speaking.

## 2019-04-19 NOTE — ED Provider Notes (Signed)
Care handoff received from [facility] PA-C at shift change please see her note for full details.  In short 33 year old female presents today with abdominal pain and headache x2 days.  Reports urinary frequency and vaginal bleeding.  Patient found to be pregnant, also with urinary tract infection.  Pelvic exam performed by previous provider shows closed cervical os without cervical motion or adnexal tenderness, wet prep negative.  Patient was treated with Tylenol for headache.  She was treated with azithromycin and Rocephin for concern of STI.  She was given dose of Rocephin for UTI, pharmacist was consulted and plan of care was to treat patient with Macrobid for her UTI.  Plan of care at shift change is to await ultrasound and ABO/Rh results then discharge with antibiotic prescribed by previous team. Physical Exam  BP 123/69 (BP Location: Left Arm)   Pulse 74   Temp 98 F (36.7 C) (Oral)   Resp 18   LMP 03/29/2019   SpO2 100%   Physical Exam Constitutional:      General: She is not in acute distress.    Appearance: Normal appearance. She is well-developed. She is not ill-appearing or diaphoretic.  HENT:     Head: Normocephalic and atraumatic.     Right Ear: External ear normal.     Left Ear: External ear normal.     Nose: Nose normal.  Eyes:     General: Vision grossly intact. Gaze aligned appropriately.     Pupils: Pupils are equal, round, and reactive to light.  Neck:     Trachea: Trachea and phonation normal. No tracheal deviation.  Pulmonary:     Effort: Pulmonary effort is normal. No respiratory distress.  Abdominal:     General: There is no distension.     Palpations: Abdomen is soft.     Tenderness: There is no abdominal tenderness. There is no guarding or rebound.  Musculoskeletal:     Cervical back: Normal range of motion.  Skin:    General: Skin is warm and dry.  Neurological:     Mental Status: She is alert.     GCS: GCS eye subscore is 4. GCS verbal subscore  is 5. GCS motor subscore is 6.     Comments: Speech is clear and goal oriented, follows commands Major Cranial nerves without deficit, no facial droop Moves extremities without ataxia, coordination intact  Psychiatric:        Behavior: Behavior normal.     ED Course/Procedures     Procedures  MDM  ABO Rh: A+ Wet prep negative GC chlamydia and HIV/RPR pending Urine culture pending Quantitative hCG 88,179 Urinalysis consistent with UTI CMP nonacute Lipase within normal limit CBC nonacute Ultrasound OB:  IMPRESSION:  Single live intrauterine gestation of 6 weeks 1 day. Small  subchorionic hemorrhage is noted.   -------- Patient is Rh+ so RhoGam is not indicated.  She has been treated with Rocephin and azithromycin for coverage of STI, per previous providers exam no concern for PID at this time.  Quantitative hCG is consistent with gestational age on ultrasound.  Patient has an OB/GYN already that she can follow-up with.  Patient made aware of above findings today including subchorionic hemorrhage and that she needs to call her OB/GYN today to schedule a follow-up appointment.  She is aware that prescription has been sent to her pharmacy by previous team and to take as prescribed.  Advised to increase water intake and get rest.  On reassessment she is resting comfortably no  acute distress reports she is feeling well has no further questions or concerns.  At this time there does not appear to be any evidence of an acute emergency medical condition and the patient appears stable for discharge with appropriate outpatient follow up. Diagnosis was discussed with patient who verbalizes understanding of care plan and is agreeable to discharge. I have discussed return precautions with patient who verbalizes understanding of return precautions. Patient encouraged to follow-up with their PCP and OBGYN. All questions answered. Patient has been discharged in good condition.  Patient's case  discussed with Dr. Vivia Ewing who agrees with plan to discharge with follow-up.   [employer] video interpreter used throughout visit.  Note: Portions of this report may have been transcribed using voice recognition software. Every effort was made to ensure accuracy; however, inadvertent computerized transcription errors may still be present.   Elizabeth Palau 04/19/19 1742    Derwood Kaplan, MD 04/19/19 2012

## 2019-04-20 LAB — RPR: RPR Ser Ql: NONREACTIVE

## 2019-04-20 LAB — ABO/RH: ABO/RH(D): A POS

## 2019-04-21 LAB — URINE CULTURE: Culture: >=100000 — AB

## 2019-04-22 ENCOUNTER — Telehealth: Payer: Self-pay | Admitting: Emergency Medicine

## 2019-04-22 NOTE — Telephone Encounter (Signed)
Post ED Visit - Positive Culture Follow-up  Culture report reviewed by antimicrobial stewardship pharmacist: [place] Team []  Elenor Quinones, Pharm.D. []  Heide Guile, Pharm.D., BCPS AQ-ID []  Parks Neptune, Pharm.D., BCPS []  Alycia Rossetti, Pharm.D., BCPS []  [place], Pharm.D., BCPS, AAHIVP []  Legrand Como, Pharm.D., BCPS, AAHIVP []  Salome Arnt, PharmD, BCPS []  Johnnette Gourd, PharmD, BCPS []  Hughes Better, PharmD, BCPS []  Leeroy Cha, PharmD []  Laqueta Linden, PharmD, BCPS []  Albertina Parr, PharmD  [place] Team []  Leodis Sias, PharmD []  Lindell Spar, PharmD [x]  Royetta Asal, PharmD []  Graylin Shiver, Rph []  Rema Fendt) [place] Mac, PharmD []  Arlyn Dunning, PharmD []  Netta Cedars, PharmD []  Dia Sitter, PharmD []  Leone [place], PharmD []  Gretta Arab, PharmD []  Theodis Shove, PharmD []  Peggyann Juba, PharmD []  Reuel Boom, PharmD   Positive urine culture Treated with nitrofurantoin, organism sensitive to the same and no further patient follow-up is required at this time.  Hazle Nordmann 04/22/2019, 2:21 PM

## 2019-04-23 LAB — GC/CHLAMYDIA PROBE AMP (~~LOC~~) NOT AT ARMC
Chlamydia: NEGATIVE
Neisseria Gonorrhea: NEGATIVE

## 2019-05-27 ENCOUNTER — Inpatient Hospital Stay (HOSPITAL_COMMUNITY)
Admission: AD | Admit: 2019-05-27 | Discharge: 2019-05-27 | Disposition: A | Discharge status: Home / Self Care | Payer: Self-pay | Attending: Obstetrics and Gynecology | Admitting: Obstetrics and Gynecology

## 2019-05-27 ENCOUNTER — Encounter (HOSPITAL_COMMUNITY): Payer: Self-pay | Admitting: *Deleted

## 2019-05-27 DIAGNOSIS — O26899 Other specified pregnancy related conditions, unspecified trimester: Secondary | ICD-10-CM | POA: Diagnosis present

## 2019-05-27 DIAGNOSIS — Z880 Allergy status to penicillin: Secondary | ICD-10-CM | POA: Insufficient documentation

## 2019-05-27 DIAGNOSIS — Z3A13 13 weeks gestation of pregnancy: Secondary | ICD-10-CM

## 2019-05-27 DIAGNOSIS — O26891 Other specified pregnancy related conditions, first trimester: Secondary | ICD-10-CM

## 2019-05-27 DIAGNOSIS — R1030 Lower abdominal pain, unspecified: Secondary | ICD-10-CM | POA: Insufficient documentation

## 2019-05-27 DIAGNOSIS — Z3A00 Weeks of gestation of pregnancy not specified: Secondary | ICD-10-CM | POA: Insufficient documentation

## 2019-05-27 DIAGNOSIS — R109 Unspecified abdominal pain: Secondary | ICD-10-CM

## 2019-05-27 LAB — WET PREP, GENITAL
Clue Cells Wet Prep HPF POC: NONE SEEN
Sperm: NONE SEEN
Trich, Wet Prep: NONE SEEN
Yeast Wet Prep HPF POC: NONE SEEN

## 2019-05-27 LAB — URINALYSIS, ROUTINE W REFLEX MICROSCOPIC
Bilirubin Urine: NEGATIVE
Glucose, UA: NEGATIVE mg/dL
Hgb urine dipstick: NEGATIVE
Ketones, ur: NEGATIVE mg/dL
Leukocytes,Ua: NEGATIVE
Nitrite: NEGATIVE
Protein, ur: NEGATIVE mg/dL
Specific Gravity, Urine: 1.011 (ref 1.005–1.030)
pH: 6 (ref 5.0–8.0)

## 2019-05-27 MED ORDER — CYCLOBENZAPRINE HCL 10 MG PO TABS
10.0000 mg | ORAL_TABLET | Freq: Once | ORAL | Status: AC
  Administered 2019-05-27: 10 mg via ORAL
  Filled 2019-05-27: qty 1

## 2019-05-27 MED ORDER — ACETAMINOPHEN 500 MG PO TABS
1000.0000 mg | ORAL_TABLET | Freq: Once | ORAL | Status: AC
  Administered 2019-05-27: 1000 mg via ORAL
  Filled 2019-05-27: qty 2

## 2019-05-27 NOTE — Discharge Instructions (Signed)
Las medicinas seguras para tomar Academic librarian  Safe Medications in Pregnancy  Acn:  Benzoyl Peroxide (Perxido de benzolo)  Salicylic Acid (cido saliclico)  Dolor de espalda/Dolor de cabeza:  Tylenol: 2 pastillas de concentracin regular cada 4 horas O 2 pastillas de concentracin fuerte cada 6 horas  Resfriados/Tos/Alergias:  Benadryl (sin alcohol) 25 mg cada 6 horas segn lo necesite Breath Right strips (Tiras para respirar correctamente)  Claritin  Cepacol (pastillas de chupar para la garganta)  Chloraseptic (aerosol para la garganta)  Cold-Eeze- hasta tres veces por da  Cough drops (pastillas de chupar para la tos, sin alcohol)  Flonase (con receta mdica solamente)  Guaifenesin  Mucinex  Robitussin DM (simple solamente, sin alcohol)  Saline nasal spray/drops (Aerosol nasal salino/gotas) Sudafed (pseudoephedrine) y  Actifed * utilizar slo despus de 12 semanas de gestacin y si no tiene la presin arterial alta.  Tylenol Vicks  VapoRub  Zinc lozenges (pastillas para la garganta)  Zyrtec  Estreimiento:  Colace  Ducolax (supositorios)  Fleet enema (lavado intestinal rectal)  Glycerin (supositorios)  Metamucil  Milk of magnesia (leche de magnesia)  Miralax  Senokot  Smooth Move (t)  Diarrea:  Kaopectate Imodium A-D  *NO tome Pepto-Bismol  Hemorroides:  Anusol  Anusol HC  Preparation H  Tucks  Indigestin:  Tums  Maalox  Mylanta  Zantac  Pepcid  Insomnia:  Benadryl (sin alcohol) 25mg  cada 6 horas segn lo necesite  Tylenol PM  Unisom, no Gelcaps  Calambres en las piernas:  Tums  MagGel Nuseas/Vmitos:  Bonine  Dramamine  Emetrol  Ginger (extracto)  Sea-Bands  Meclizine  Medicina para las nuseas que puede tomar durante el embarazo: Unisom (doxylamine succinate, pastillas de 25 mg) Tome una pastilla al da al [place]. Si los sntomas no estn adecuadamente controlados, la dosis puede aumentarse hasta una dosis mxima recomendada de [place] al da (1/2 pastilla por la [place], 1/2 pastilla a media tarde y [place] pastilla al [place]). Pastillas de Vitamina B6 de 100mg . Tome [place] veces al da (hasta 200 mg por da).  Erupciones en la piel:  Productos de Aveeno  Benadryl cream (crema o una dosis de 25mg  cada 6 horas segn lo necesite)  Calamine Lotion (locin)  1% cortisone cream (crema de cortisona de 1%)  nfeccin vaginal por hongos (candidiasis):  Gyne-lotrimin 7  Monistat 7   **Si est tomando varias medicinas, por favor revise las etiquetas para los mismos ingredientes [place]. **Tome la medicina segn lo indicado en la etiqueta. **No tome ms de 400 mg de Tylenol en 24 horas. **No tome medicinas que contengan aspirina o ibuprofeno.     Center for [facility] Healthcare Prenatal Care Providers          Center for [facility] Healthcare locations:  Hours may vary. Please call for an appointment  Center for [facility] Healthcare @ [place]  [address] [place]  [phone]  Center for [facility] Healthcare @ [facility]   [address]  [phone]  Center For [facility] Healthcare @ [facility]       [address] [phone]            Center for [facility] Healthcare @ [place]     [phone]          Center for [facility] Healthcare @ [facility]   [address] [phone]  Center for [facility] Healthcare @ [place]  [address] [phone]  [phone]     Center for [place] @ [facility])  [place]   [phone]

## 2019-05-27 NOTE — MAU Note (Signed)
Pt states she started having lower abdominal pain that started 2 days ago and today got worse. She states it is constant but it was come and go up until now. She reports vaginal bleeding that started 2 days ago but has not seen it since then. She saw it only when wiping after using the bathroom. Also reports a watery discharge that does not have an odor and denies itching or burning. Last intercourse was 3 days ago.

## 2019-05-27 NOTE — MAU Provider Note (Addendum)
History     CSN: [id number]  Arrival date and time: 05/27/19 1447   First Provider Initiated Contact with Patient 05/27/19 1538      Chief Complaint  Patient presents with   Abdominal Pain   HPI  Patient is a 34 yo G5P3013 that presents with complaint of abdominal pain for 2 days. The pain would come and go but it is now constant. Locates the pain to the lower abdomen. Denies dysuria, discharge, constipation, diarrhea. Admits to vaginal bleeding two days ago, but denies bleeding since.   Patient speaks Spanish; utilized Runner, broadcasting/film/video to communicate.   OB History     Gravida  5   Para  3   Term  3   Preterm      AB  1   Living  3      SAB  1   TAB      Ectopic      Multiple      Live Births              Past Medical History:  Diagnosis Date   Heart murmur    childhood   Hypertension    Kidney infection     Past Surgical History:  Procedure Laterality Date   APPENDECTOMY      History reviewed. No pertinent family history.  Social History   Tobacco Use   Smoking status: Never Smoker   Smokeless tobacco: Never Used  Substance Use Topics   Alcohol use: No   Drug use: No    Allergies:  Allergies  Allergen Reactions   Penicillins Hives and Rash    Leg pains    Medications Prior to Admission  Medication Sig Dispense Refill Last Dose   doxycycline (VIBRA-TABS) 100 MG tablet Take 1 tablet (100 mg total) by mouth 2 (two) times daily. 1 po bid 56 tablet 0    ibuprofen (ADVIL) 600 MG tablet Take 1 tablet (600 mg total) by mouth every 6 (six) hours as needed. Take with food 24 tablet 0     Review of Systems  Gastrointestinal: Positive for abdominal pain. Negative for constipation, diarrhea and vomiting.  Genitourinary: Negative for difficulty urinating, dysuria and vaginal discharge.   Physical Exam   Blood pressure (!) 112/56, pulse 63, last menstrual period 03/29/2019, SpO2 100 %, unknown if currently breastfeeding.  Physical  Exam  Constitutional: She appears well-developed and well-nourished.  HENT:  Head: Normocephalic and atraumatic.  Eyes: Conjunctivae and EOM are normal.  Cardiovascular: Normal rate, regular rhythm and normal heart sounds.  Respiratory: Breath sounds normal. No respiratory distress.  GI: Soft. There is abdominal tenderness in the suprapubic area. There is no rebound and no guarding.  Mild diffuse tenderness in LLQ and RLQ.   Genitourinary: Uterus is tender. Cervix exhibits no motion tenderness.    Vaginal discharge (moderate, white) and tenderness present.  There is tenderness in the vagina.    Genitourinary Comments: Significant discomfort during speculum exam. Gc/ct and wet prep swabs collected. During bimanual examination, significant tenderness in the suprapubic region with diffuse tenderness across RLQ and LLQ.    Musculoskeletal:        General: Normal range of motion.     Cervical back: Normal range of motion.  Neurological: She is alert.  Skin: Skin is warm and dry.  Psychiatric: She has a normal mood and affect. Her behavior is normal.    MAU Course  Procedures  .Marland Kitchen Results for orders placed or performed during the hospital  encounter of 05/27/19 (from the past 24 hour(s))  Wet prep, genital     Status: Abnormal   Collection Time: 05/27/19  3:49 PM  Result Value Ref Range   Yeast Wet Prep HPF POC NONE SEEN NONE SEEN   Trich, Wet Prep NONE SEEN NONE SEEN   Clue Cells Wet Prep HPF POC NONE SEEN NONE SEEN   WBC, Wet Prep HPF POC MANY (A) NONE SEEN   Sperm NONE SEEN   Urinalysis, Routine w reflex microscopic     Status: Abnormal   Collection Time: 05/27/19  5:22 PM  Result Value Ref Range   Color, Urine YELLOW YELLOW   APPearance CLOUDY (A) CLEAR   Specific Gravity, Urine 1.011 1.005 - 1.030   pH 6.0 5.0 - 8.0   Glucose, UA NEGATIVE NEGATIVE mg/dL   Hgb urine dipstick NEGATIVE NEGATIVE   Bilirubin Urine NEGATIVE NEGATIVE   Ketones, ur NEGATIVE NEGATIVE mg/dL   Protein,  ur NEGATIVE NEGATIVE mg/dL   Nitrite NEGATIVE NEGATIVE   Leukocytes,Ua NEGATIVE NEGATIVE    MDM Patient is stable, in no acute distress. Physical exam indicated significant abdominal tenderness.  Urinalysis and wet prep (-)  Gc/ct results pending.  Bedside U/s indicated fetal movement within gestational sac.  Pain rating decreased with Flexeril and tylenol administration.   Assessment and Plan   1. Abdominal pain in first trimester - Tylenol prn for pain alleviation. Avoid use of NSAIDs.  - Resources to establish prenatal care given to patient.   Follow up for initial prenatal care.   Avelina Laine 05/27/2019, 6:18 PM   I confirm that I have verified the information documented in the physician assistant student's note and that I have also personally reperformed the history, physical exam and all medical decision making activities of this service and have verified that all service and findings are accurately documented in this student's note.   Additional HPI:  The patient presents to MAU reporting intermittent, lower abdominal pain for the past 3 days. She reports that this pain increased and is more constant today. She rates the pain a 7/10. She reports she had VB 2 days ago when she wiped in the BR, but none since then. She reports last SI was 3 days ago. She reports a watery vaginal d/c with no odor, itching or irritation. She travelled to [place] on 04/22/2019. She had "heavy" vaginal bleeding while there. She saw a MD who performed an ultrasound then and again the 2nd week she was there. She states he told her she had a "large blood clot there". She returned from [place] on 05/22/2019.   Procedure performed in MAU: Patient informed that the ultrasound is considered a limited OB ultrasound and is not intended to be a complete ultrasound exam.  Patient also informed that the ultrasound is not being completed with the intent of assessing for fetal or placental anomalies or any pelvic  abnormalities.  Explained that the purpose of today's ultrasound is to assess for viability.  Baby was found to be very active with a audible HR of  ~160 bpm. Picture was printed and given to patient. Reassurance that baby appears to be doing well. Patient expressed feeling better seeing that baby is ok. Patient acknowledges the purpose of the exam and the limitations of the study.  Prenatal OB Provider list given to patient and advised to start [facility] ASAP.   [employer] interpreter used for history taking & exam was Trinna Post [phone]. Interpreter for ultrasound & discharge  instructions was [place]ia [id number].      Laury Deep, CNM 05/27/2019 7:57 PM

## 2019-05-28 LAB — GC/CHLAMYDIA PROBE AMP (~~LOC~~) NOT AT ARMC
Chlamydia: NEGATIVE
Comment: NEGATIVE
Comment: NORMAL
Neisseria Gonorrhea: NEGATIVE

## 2019-06-05 ENCOUNTER — Encounter: Payer: Self-pay | Admitting: *Deleted

## 2019-06-07 ENCOUNTER — Ambulatory Visit (INDEPENDENT_AMBULATORY_CARE_PROVIDER_SITE_OTHER): Payer: Self-pay | Admitting: Certified Nurse Midwife

## 2019-06-07 ENCOUNTER — Encounter: Payer: Self-pay | Admitting: Certified Nurse Midwife

## 2019-06-07 ENCOUNTER — Other Ambulatory Visit: Payer: Self-pay

## 2019-06-07 ENCOUNTER — Encounter (HOSPITAL_COMMUNITY): Payer: Self-pay | Admitting: *Deleted

## 2019-06-07 VITALS — BP 93/58 | HR 66 | Temp 98.3°F | Wt 180.0 lb

## 2019-06-07 DIAGNOSIS — Z113 Encounter for screening for infections with a predominantly sexual mode of transmission: Secondary | ICD-10-CM

## 2019-06-07 DIAGNOSIS — Z3A13 13 weeks gestation of pregnancy: Secondary | ICD-10-CM

## 2019-06-07 DIAGNOSIS — O26891 Other specified pregnancy related conditions, first trimester: Secondary | ICD-10-CM

## 2019-06-07 DIAGNOSIS — Z3480 Encounter for supervision of other normal pregnancy, unspecified trimester: Secondary | ICD-10-CM | POA: Insufficient documentation

## 2019-06-07 DIAGNOSIS — B373 Candidiasis of vulva and vagina: Secondary | ICD-10-CM

## 2019-06-07 DIAGNOSIS — Z789 Other specified health status: Secondary | ICD-10-CM

## 2019-06-07 DIAGNOSIS — N898 Other specified noninflammatory disorders of vagina: Secondary | ICD-10-CM | POA: Insufficient documentation

## 2019-06-07 DIAGNOSIS — Z603 Acculturation difficulty: Secondary | ICD-10-CM

## 2019-06-07 DIAGNOSIS — Z1151 Encounter for screening for human papillomavirus (HPV): Secondary | ICD-10-CM

## 2019-06-07 DIAGNOSIS — Z124 Encounter for screening for malignant neoplasm of cervix: Secondary | ICD-10-CM

## 2019-06-07 NOTE — Progress Notes (Signed)
Pt had bleeding 2-3 weeks ago. Was seen at a hospital but, doesn't know name of hospital.

## 2019-06-07 NOTE — Patient Instructions (Signed)
Segundo trimestre de embarazo Second Trimester of Pregnancy  El segundo trimestre va desde la semana14 hasta la 27 (desde el mes 4 hasta el 6). Este suele ser el momento en el que mejor se siente. En general, las nuseas matutinas han disminuido o han desaparecido completamente. Tendr ms energa y podr aumentarle el apetito. El beb en gestacin se desarrolla rpidamente. Hacia el final del sexto mes, el beb mide aproximadamente 9 pulgadas (23 cm) y pesa alrededor de 1 libras (700 g). Es probable que sienta al beb moverse entre las 18 y 20 semanas del embarazo. Siga estas indicaciones en su casa: Medicamentos  Tome los medicamentos de venta libre y los recetados solamente como se lo haya indicado el mdico. Algunos medicamentos son seguros para tomar durante el embarazo y otros no lo son.  Tome vitaminas prenatales que contengan por lo menos 600microgramos (?g) de cido flico.  Si tiene dificultad para mover el intestino (estreimiento), tome un medicamento para ablandar las heces (laxante) si su mdico se lo autoriza. Comida y bebida   Ingiera alimentos saludables de manera regular.  No coma carne cruda ni quesos sin cocinar.  Si obtiene poca cantidad de calcio de los alimentos que ingiere, consulte a su mdico sobre la posibilidad de tomar un suplemento diario de calcio.  Evite el consumo de alimentos ricos en grasas y azcares, como los alimentos fritos y los dulces.  Si tiene malestar estomacal (nuseas) o devuelve (vomita): ? Ingiera 4 o 5comidas pequeas por da en lugar de 3abundantes. ? Intente comer algunas galletitas saladas. ? Beba lquidos entre las comidas, en lugar de hacerlo durante estas.  Para evitar el estreimiento: ? Consuma alimentos ricos en fibra, como frutas y verduras frescas, cereales integrales y frijoles. ? Beba suficiente lquido para mantener el pis (orina) claro o de color amarillo plido. Actividad  Haga ejercicios solamente como se lo haya  indicado el mdico. Interrumpa la actividad fsica si comienza a tener calambres.  No haga ejercicio si hace demasiado calor, hay demasiada humedad o se encuentra en un lugar de mucha altura (altitud alta).  Evite levantar pesos excesivos.  Use zapatos con tacones bajos. Mantenga una buena postura al sentarse y pararse.  Puede continuar teniendo relaciones sexuales, a menos que el mdico le indique lo contrario. Alivio del dolor y del malestar  Use un sostn que le brinde buen soporte si sus mamas estn sensibles.  Dese baos de asiento con agua tibia para aliviar el dolor o las molestias causadas por las hemorroides. Use una crema para las hemorroides si el mdico la autoriza.  Descanse con las piernas elevadas si tiene calambres o dolor de cintura.  Si desarrolla venas hinchadas y abultadas (vrices) en las piernas: ? Use medias de compresin o medias de descanso como se lo haya indicado el mdico. ? Levante (eleve) los pies durante 15minutos, 3 o 4veces por da. ? Limite el consumo de sal en sus alimentos. Cuidado prenatal  Escriba sus preguntas. Llvelas cuando concurra a las visitas prenatales.  Concurra a todas las visitas prenatales como se lo haya indicado el mdico. Esto es importante. Seguridad  Colquese el cinturn de seguridad cuando conduzca.  Haga una lista de los nmeros de telfono de emergencia, que incluya los nmeros de telfono de familiares, amigos, el hospital, as como los departamentos de polica y bomberos. Instrucciones generales  Consulte a su mdico sobre los alimentos que debe comer o pdale que la ayude a encontrar a quien pueda aconsejarla si necesita ese servicio.    Consulte a su mdico acerca de dnde se dictan clases prenatales cerca de donde vive. Comience las clases antes del mes 6 de embarazo.  No se d baos de inmersin en agua caliente, baos turcos ni saunas.  No se haga duchas vaginales ni use tampones o toallas higinicas perfumadas.   No mantenga las piernas cruzadas durante mucho tiempo.  Vaya al dentista si an no lo hizo. Use un cepillo de cerdas suaves para cepillarse los dientes. Psese el hilo dental suavemente.  No fume, no consuma hierbas ni beba alcohol. No tome frmacos que el mdico no haya autorizado.  No consuma ningn producto que contenga nicotina o tabaco, como cigarrillos y cigarrillos electrnicos. Si necesita ayuda para dejar de fumar, consulte al mdico.  Evite el contacto con las bandejas sanitarias de los gatos y la tierra que estos animales usan. Estos elementos contienen bacterias que pueden causar defectos congnitos al beb y la posible prdida del beb (aborto espontneo) o la muerte fetal. Comunquese con un mdico si:  Tiene clicos leves o siente presin en la parte baja del vientre.  Tiene dolor al hacer pis (orinar).  Advierte un lquido con olor ftido que proviene de la vagina.  Tiene malestar estomacal (nuseas), devuelve (vomita) o tiene deposiciones acuosas (diarrea).  Sufre un dolor persistente en el abdomen.  Siente mareos. Solicite ayuda de inmediato si:  Tiene fiebre.  Tiene una prdida de lquido por la vagina.  Tiene sangrado o pequeas prdidas vaginales.  Siente dolor intenso o clicos en el abdomen.  Sube o baja de peso rpidamente.  Tiene dificultades para recuperar el aliento y siente dolor en el pecho.  Sbitamente se le hinchan mucho el rostro, las manos, los tobillos, los pies o las piernas.  No ha sentido los movimientos del beb durante una hora.  Siente un dolor de cabeza intenso que no se alivia al tomar medicamentos.  Tiene dificultad para ver. Resumen  El segundo trimestre va desde la semana14 hasta la 27, desde el mes 4 hasta el 6. Este suele ser el momento en el que mejor se siente.  Para cuidarse y cuidar a su beb en gestacin, debe comer alimentos saludables, tomar medicamentos solamente si su mdico le indica que lo haga y hacer  actividades que sean seguras para usted y su beb.  Llame al mdico si se enferma o si nota algo inusual acerca de su embarazo. Tambin llame al mdico si necesita ayuda para saber qu alimentos debe comer o si quiere saber qu actividades puede realizar de forma segura. Esta informacin no tiene como fin reemplazar el consejo del mdico. Asegrese de hacerle al mdico cualquier pregunta que tenga. Document Revised: 01/18/2017 Document Reviewed: 01/18/2017 Elsevier Patient Education  2020 Elsevier Inc.  

## 2019-06-08 ENCOUNTER — Encounter: Payer: Self-pay | Admitting: Certified Nurse Midwife

## 2019-06-08 MED ORDER — PREPLUS 27-1 MG PO TABS: 1.0000 | ORAL_TABLET | Freq: Every day | ORAL | 13 refills | Status: DC

## 2019-06-08 NOTE — Progress Notes (Signed)
History:   Alicia Gilbert is a 34 y.o. G5P3010 at [redacted]w[redacted]d by early ultrasound being seen today for her first obstetrical visit.  Patient does intend to breast feed. Pregnancy history fully reviewed.  Patient reports vaginal discharge.     HISTORY: OB History  Gravida Para Term Preterm AB Living  5 3 3  0 1 0  SAB TAB Ectopic Multiple Live Births  1 0 0 0 0    # Outcome Date GA Lbr Len/2nd Weight Sex Delivery Anes PTL Lv  5 Current           4 SAB           3 Term           2 Term           1 Term              Past Medical History:  Diagnosis Date  . Heart murmur    childhood  . Hypertension   . Kidney infection    Past Surgical History:  Procedure Laterality Date  . APPENDECTOMY     Family History  Problem Relation Age of Onset  . Heart disease Mother   . Heart disease Father    Social History   Tobacco Use  . Smoking status: Never Smoker  . Smokeless tobacco: Never Used  Substance Use Topics  . Alcohol use: Never  . Drug use: Never   Allergies  Allergen Reactions  . Penicillins   . Penicillins Hives and Rash    Leg pains   Current Outpatient Medications on File Prior to Visit  Medication Sig Dispense Refill  . acetaminophen (TYLENOL) 325 MG tablet Take 650 mg by mouth every 6 (six) hours as needed.     No current facility-administered medications on file prior to visit.    Review of Systems Pertinent items noted in HPI and remainder of comprehensive ROS otherwise negative. Physical Exam:   Vitals:   06/07/19 0909  BP: (!) 93/58  Pulse: 66  Temp: 98.3 F (36.8 C)  Weight: 180 lb (81.6 kg)   Fetal Heart Rate (bpm): 141 Uterus:   13 week uterus palpated   Pelvic Exam: Perineum: no hemorrhoids, normal perineum   Vulva: normal external genitalia, no lesions   Vagina:  normal mucosa, normal discharge   Cervix: no lesions and normal, pap smear done.    Adnexa: normal adnexa and no mass, fullness, tenderness   Bony Pelvis: average    System: General: well-developed, well-nourished female in no acute distress   Breasts:  normal appearance, no masses or tenderness bilaterally   Skin: normal coloration and turgor, no rashes   Neurologic: oriented, normal, negative, normal mood   Extremities: normal strength, tone, and muscle mass, ROM of all joints is normal   HEENT PERRLA, extraocular movement intact and sclera clear   Mouth/Teeth mucous membranes moist, pharynx normal without lesions and dental hygiene good   Neck supple and no masses   Cardiovascular: regular rate and rhythm   Respiratory:  no respiratory distress, normal breath sounds   Abdomen: soft, non-tender; bowel sounds normal; no masses,  no organomegaly     Assessment:    Pregnancy: [id number] Patient Active Problem List   Diagnosis Date Noted  . Supervision of other normal pregnancy, antepartum 06/07/2019  . Vaginal discharge during pregnancy, antepartum 06/07/2019  . Language barrier 06/07/2019  . Abdominal pain affecting pregnancy 05/27/2019     Plan:    1. Supervision of  other normal pregnancy, antepartum - Welcomed to practice and introduced self to patient  - Anticipatory guidance on upcoming appointments  - Reviewed safety, visitor policy, reassurance about COVID-19 for pregnancy at this time. Discussed possible changes to visits, including televisits, that may occur due to COVID-19.  The office remains open if pt needs to be seen and MAU is open 24 hours/day for OB emergencies. - Dating changed based on [place] on 04/19/19 - Obstetric panel - HIV antibody (with reflex) - Genetic Screening - Culture, OB Urine - Cytology - PAP( [place]) - HgB A1c - [place] MFM OB COMP + 14 WK; Future  2. Vaginal discharge during pregnancy, antepartum - Patient reports thin white discharge, denies irritation, itching or odor  - Cervicovaginal ancillary only( [place])  3. Language barrier - Spanish interpreter at bedside    Initial labs drawn. Continue  prenatal vitamins.- new Rx sent to pharmacy of choice  Genetic Screening discussed, NIPS: ordered. Ultrasound discussed; fetal anatomic survey: ordered. Problem list reviewed and updated. The nature of  - [facility] with multiple MDs and other Advanced Practice Providers was explained to patient; also emphasized that residents, students are part of our team. Routine obstetric precautions reviewed. Return in about 4 weeks (around 07/05/2019) for ROB- in person/genetic screening.     Sharyon Cable, CNM [facility] [employer], [facility]

## 2019-06-09 LAB — URINE CULTURE, OB REFLEX

## 2019-06-09 LAB — CULTURE, OB URINE

## 2019-06-10 LAB — OBSTETRIC PANEL
Absolute Monocytes: 341 cells/uL (ref 200–950)
Antibody Screen: NOT DETECTED
Basophils Absolute: 31 cells/uL (ref 0–200)
Basophils Relative: 0.5 %
Eosinophils Absolute: 50 cells/uL (ref 15–500)
Eosinophils Relative: 0.8 %
HCT: 33.7 % — ABNORMAL LOW (ref 35.0–45.0)
Hemoglobin: 11.4 g/dL — ABNORMAL LOW (ref 11.7–15.5)
Hepatitis B Surface Ag: NONREACTIVE
Lymphs Abs: 1798 cells/uL (ref 850–3900)
MCH: 32.4 pg (ref 27.0–33.0)
MCHC: 33.8 g/dL (ref 32.0–36.0)
MCV: 95.7 fL (ref 80.0–100.0)
MPV: 9.7 fL (ref 7.5–12.5)
Monocytes Relative: 5.5 %
Neutro Abs: 3980 cells/uL (ref 1500–7800)
Neutrophils Relative %: 64.2 %
Platelets: 258 10*3/uL (ref 140–400)
RBC: 3.52 10*6/uL — ABNORMAL LOW (ref 3.80–5.10)
RDW: 12.4 % (ref 11.0–15.0)
RPR Ser Ql: NONREACTIVE
Rubella: 4.89 Index
Total Lymphocyte: 29 %
WBC: 6.2 10*3/uL (ref 3.8–10.8)

## 2019-06-10 LAB — CERVICOVAGINAL ANCILLARY ONLY
Bacterial Vaginitis (gardnerella): NEGATIVE
Candida Glabrata: NEGATIVE
Candida Vaginitis: POSITIVE — AB
Chlamydia: NEGATIVE
Comment: NEGATIVE
Comment: NEGATIVE
Comment: NEGATIVE
Comment: NEGATIVE
Comment: NEGATIVE
Comment: NORMAL
Neisseria Gonorrhea: NEGATIVE
Trichomonas: NEGATIVE

## 2019-06-10 LAB — CYTOLOGY - PAP
Comment: NEGATIVE
Diagnosis: NEGATIVE
High risk HPV: POSITIVE — AB

## 2019-06-10 LAB — HEMOGLOBIN A1C
Hgb A1c MFr Bld: 5.3 % of total Hgb (ref <5.7)
Mean Plasma Glucose: 105 (calc)
eAG (mmol/L): 5.8 (calc)

## 2019-06-10 LAB — HIV ANTIBODY (ROUTINE TESTING W REFLEX): HIV 1&2 Ab, 4th Generation: NONREACTIVE

## 2019-06-12 ENCOUNTER — Telehealth: Payer: Self-pay

## 2019-06-12 MED ORDER — TERCONAZOLE 0.4 % VA CREA: 1.0000 | TOPICAL_CREAM | Freq: Every day | VAGINAL | 0 refills | Status: DC

## 2019-06-12 NOTE — Addendum Note (Signed)
Addended by: Sharyon Cable on: 06/12/2019 08:24 AM   Modules accepted: Orders

## 2019-06-12 NOTE — Telephone Encounter (Signed)
Spoke with pt and her daughter who interprets for her and she is aware she has a yeast infection and that medication has been sent to her pharmacy.

## 2019-06-17 ENCOUNTER — Encounter: Payer: Self-pay | Admitting: *Deleted

## 2019-06-28 ENCOUNTER — Encounter (HOSPITAL_BASED_OUTPATIENT_CLINIC_OR_DEPARTMENT_OTHER): Payer: Self-pay | Admitting: *Deleted

## 2019-06-28 ENCOUNTER — Emergency Department (HOSPITAL_BASED_OUTPATIENT_CLINIC_OR_DEPARTMENT_OTHER)
Admission: EM | Admit: 2019-06-28 | Discharge: 2019-06-29 | Disposition: A | Discharge status: Home / Self Care | Payer: Medicaid Other | Attending: Emergency Medicine | Admitting: Emergency Medicine

## 2019-06-28 ENCOUNTER — Other Ambulatory Visit: Payer: Self-pay

## 2019-06-28 DIAGNOSIS — Z3A18 18 weeks gestation of pregnancy: Secondary | ICD-10-CM | POA: Diagnosis not present

## 2019-06-28 DIAGNOSIS — O218 Other vomiting complicating pregnancy: Secondary | ICD-10-CM | POA: Diagnosis not present

## 2019-06-28 DIAGNOSIS — Z79899 Other long term (current) drug therapy: Secondary | ICD-10-CM | POA: Diagnosis not present

## 2019-06-28 DIAGNOSIS — R1013 Epigastric pain: Secondary | ICD-10-CM | POA: Diagnosis not present

## 2019-06-28 DIAGNOSIS — O219 Vomiting of pregnancy, unspecified: Secondary | ICD-10-CM

## 2019-06-28 DIAGNOSIS — O10012 Pre-existing essential hypertension complicating pregnancy, second trimester: Secondary | ICD-10-CM | POA: Diagnosis not present

## 2019-06-28 DIAGNOSIS — Z880 Allergy status to penicillin: Secondary | ICD-10-CM | POA: Diagnosis not present

## 2019-06-28 DIAGNOSIS — E876 Hypokalemia: Secondary | ICD-10-CM | POA: Diagnosis not present

## 2019-06-28 DIAGNOSIS — O2332 Infections of other parts of urinary tract in pregnancy, second trimester: Secondary | ICD-10-CM | POA: Diagnosis not present

## 2019-06-28 DIAGNOSIS — N3000 Acute cystitis without hematuria: Secondary | ICD-10-CM

## 2019-06-28 DIAGNOSIS — O99891 Other specified diseases and conditions complicating pregnancy: Secondary | ICD-10-CM | POA: Diagnosis present

## 2019-06-28 LAB — URINALYSIS, ROUTINE W REFLEX MICROSCOPIC
Bilirubin Urine: NEGATIVE
Glucose, UA: NEGATIVE mg/dL
Hgb urine dipstick: NEGATIVE
Ketones, ur: NEGATIVE mg/dL
Nitrite: NEGATIVE
Protein, ur: NEGATIVE mg/dL
Specific Gravity, Urine: 1.01 (ref 1.005–1.030)
pH: 6.5 (ref 5.0–8.0)

## 2019-06-28 LAB — CBC WITH DIFFERENTIAL/PLATELET
Abs Immature Granulocytes: 0.03 10*3/uL (ref 0.00–0.07)
Basophils Absolute: 0 10*3/uL (ref 0.0–0.1)
Basophils Relative: 0 %
Eosinophils Absolute: 0.1 10*3/uL (ref 0.0–0.5)
Eosinophils Relative: 1 %
HCT: 33.7 % — ABNORMAL LOW (ref 36.0–46.0)
Hemoglobin: 11.3 g/dL — ABNORMAL LOW (ref 12.0–15.0)
Immature Granulocytes: 1 %
Lymphocytes Relative: 34 %
Lymphs Abs: 2.2 10*3/uL (ref 0.7–4.0)
MCH: 32.9 pg (ref 26.0–34.0)
MCHC: 33.5 g/dL (ref 30.0–36.0)
MCV: 98.3 fL (ref 80.0–100.0)
Monocytes Absolute: 0.5 10*3/uL (ref 0.1–1.0)
Monocytes Relative: 7 %
Neutro Abs: 3.8 10*3/uL (ref 1.7–7.7)
Neutrophils Relative %: 57 %
Platelets: 243 10*3/uL (ref 150–400)
RBC: 3.43 MIL/uL — ABNORMAL LOW (ref 3.87–5.11)
RDW: 13.5 % (ref 11.5–15.5)
WBC: 6.6 10*3/uL (ref 4.0–10.5)
nRBC: 0 % (ref 0.0–0.2)

## 2019-06-28 LAB — URINALYSIS, MICROSCOPIC (REFLEX)

## 2019-06-28 MED ORDER — ONDANSETRON HCL 4 MG/2ML IJ SOLN
4.0000 mg | Freq: Once | INTRAMUSCULAR | Status: AC
  Administered 2019-06-28: 4 mg via INTRAVENOUS
  Filled 2019-06-28: qty 2

## 2019-06-28 MED ORDER — SODIUM CHLORIDE 0.9 % IV SOLN
1.0000 g | Freq: Once | INTRAVENOUS | Status: AC
  Administered 2019-06-28: 1 g via INTRAVENOUS
  Filled 2019-06-28: qty 10

## 2019-06-28 MED ORDER — SODIUM CHLORIDE 0.9 % IV BOLUS
500.0000 mL | Freq: Once | INTRAVENOUS | Status: AC
  Administered 2019-06-28: 500 mL via INTRAVENOUS

## 2019-06-28 NOTE — ED Triage Notes (Signed)
[redacted] weeks pregnant. Epigastric pain with vomiting. Headache.

## 2019-06-28 NOTE — ED Notes (Signed)
  Fetal heart tones assessed and HR was consistently between 145-150 bpm.  Patient tolerated well.

## 2019-06-28 NOTE — ED Notes (Signed)
  Patient states she is [redacted] weeks pregnant and her feet/hands are swelling.  Patient states she also had placental abruption with 2 of her previous children but no bleeding at this time.    Patient has epigastric pain and has had several episodes of emesis since arrival.

## 2019-06-29 ENCOUNTER — Encounter (HOSPITAL_BASED_OUTPATIENT_CLINIC_OR_DEPARTMENT_OTHER): Payer: Self-pay | Admitting: Emergency Medicine

## 2019-06-29 LAB — BASIC METABOLIC PANEL
Anion gap: 8 (ref 5–15)
BUN: 8 mg/dL (ref 6–20)
CO2: 23 mmol/L (ref 22–32)
Calcium: 8.9 mg/dL (ref 8.9–10.3)
Chloride: 105 mmol/L (ref 98–111)
Creatinine, Ser: 0.44 mg/dL (ref 0.44–1.00)
GFR calc Af Amer: >60 mL/min (ref >60)
GFR calc non Af Amer: >60 mL/min (ref >60)
Glucose, Bld: 90 mg/dL (ref 70–99)
Potassium: 3.1 mmol/L — ABNORMAL LOW (ref 3.5–5.1)
Sodium: 136 mmol/L (ref 135–145)

## 2019-06-29 MED ORDER — ACETAMINOPHEN 500 MG PO TABS
1000.0000 mg | ORAL_TABLET | Freq: Once | ORAL | Status: AC
  Administered 2019-06-29: 02:00:00 1000 mg via ORAL
  Filled 2019-06-29: qty 2

## 2019-06-29 MED ORDER — POTASSIUM CHLORIDE CRYS ER 20 MEQ PO TBCR: 20.0000 meq | EXTENDED_RELEASE_TABLET | Freq: Every day | ORAL | 0 refills | Status: DC

## 2019-06-29 MED ORDER — NITROFURANTOIN MONOHYD MACRO 100 MG PO CAPS: 100.0000 mg | ORAL_CAPSULE | Freq: Two times a day (BID) | ORAL | 0 refills | Status: DC

## 2019-06-29 NOTE — ED Provider Notes (Signed)
[facility] EMERGENCY DEPARTMENT Provider Note   CSN: [id number] Arrival date & time: 06/28/19  2215     History Chief Complaint  Patient presents with  . Abdominal Pain    Alicia Gilbert is a 34 y.o. female.  The history is provided by the patient. The history is limited by a language barrier. A language interpreter was used.  Abdominal Pain Pain location:  Epigastric Pain quality: dull   Pain radiates to:  Does not radiate Pain severity:  Moderate Onset quality:  Gradual Duration:  1 day Timing:  Constant Progression:  Unchanged Chronicity:  Recurrent Context: not alcohol use and not eating   Relieved by:  Nothing Worsened by:  Nothing Ineffective treatments:  None tried Associated symptoms: nausea and vomiting   Associated symptoms: no anorexia, no belching, no chest pain, no chills, no constipation, no cough, no diarrhea, no dysuria, no fatigue, no fever, no flatus, no hematemesis, no hematochezia, no hematuria, no melena, no shortness of breath, no sore throat, no vaginal bleeding and no vaginal discharge   Risk factors: pregnancy   Patient is G3 P2 at 18 weeks with IUP.  With epigastric pain and vomiting x 5 today.  States she feels like she is swollen.  Her doctor told her she was ok but if vomiting continued to come to the ED.  Symptoms are not related to meals.  No f/cr.  No urinary symptoms no bleeding, no trauma.  No discharge.       Past Medical History:  Diagnosis Date  . Heart murmur    childhood  . Hypertension   . Kidney infection     Patient Active Problem List   Diagnosis Date Noted  . Supervision of other normal pregnancy, antepartum 06/07/2019  . Vaginal discharge during pregnancy, antepartum 06/07/2019  . Language barrier 06/07/2019  . Abdominal pain affecting pregnancy 05/27/2019    Past Surgical History:  Procedure Laterality Date  . APPENDECTOMY       OB History    Gravida  5   Para  3   Term  3   Preterm  0     AB  1   Living        SAB  1   TAB  0   Ectopic  0   Multiple      Live Births              Family History  Problem Relation Age of Onset  . Heart disease Mother   . Heart disease Father     Social History   Tobacco Use  . Smoking status: Never Smoker  . Smokeless tobacco: Never Used  Substance Use Topics  . Alcohol use: Never  . Drug use: Never    Home Medications Prior to Admission medications   Medication Sig Start Date End Date Taking? Authorizing Provider  acetaminophen (TYLENOL) 325 MG tablet Take 650 mg by mouth every 6 (six) hours as needed.   Yes [provider]  Prenatal Vit-Fe Fumarate-FA (PREPLUS) 27-1 MG TABS Take 1 tablet by mouth daily. 06/08/19  Yes Sharyon Cable, CNM  nitrofurantoin, macrocrystal-monohydrate, (MACROBID) 100 MG capsule Take 1 capsule (100 mg total) by mouth 2 (two) times daily. X 7 days 06/29/19   Devondre Guzzetta, MD  potassium chloride SA (KLOR-CON) 20 MEQ tablet Take 1 tablet (20 mEq total) by mouth daily. 06/29/19   Ryli Standlee, MD  terconazole (TERAZOL 7) 0.4 % vaginal cream Place 1 applicator vaginally at  bedtime. Use for seven days 06/12/19   Sharyon Cable, CNM    Allergies    Penicillins and Penicillins  Review of Systems   Review of Systems  Constitutional: Negative for chills, fatigue and fever.  HENT: Negative for sore throat.   Eyes: Negative for visual disturbance.  Respiratory: Negative for cough and shortness of breath.   Cardiovascular: Negative for chest pain.  Gastrointestinal: Positive for abdominal pain, nausea and vomiting. Negative for anorexia, constipation, diarrhea, flatus, hematemesis, hematochezia and melena.  Genitourinary: Negative for dysuria, flank pain, hematuria, vaginal bleeding and vaginal discharge.  Musculoskeletal: Negative for arthralgias.  Skin: Negative for color change.  Neurological: Negative for dizziness.  Psychiatric/Behavioral: Negative for agitation.  All  other systems reviewed and are negative.   Physical Exam Updated Vital Signs BP 92/60   Pulse 70   Temp 98.4 F (36.9 C) (Oral)   Resp 17   Ht 5\' 4"  (1.626 m)   LMP 02/21/2019   SpO2 100%   BMI 30.90 kg/m   Physical Exam Vitals and nursing note reviewed.  Constitutional:      Appearance: She is well-developed.  HENT:     Head: Normocephalic and atraumatic.     Mouth/Throat:     Mouth: Mucous membranes are moist.     Pharynx: Oropharynx is clear.  Eyes:     Extraocular Movements: Extraocular movements intact.     Pupils: Pupils are equal, round, and reactive to light.  Cardiovascular:     Rate and Rhythm: Normal rate and regular rhythm.  Pulmonary:     Effort: Pulmonary effort is normal.     Breath sounds: Normal breath sounds.  Abdominal:     General: Abdomen is flat. Bowel sounds are normal.     Tenderness: There is no abdominal tenderness. There is no guarding or rebound.  Musculoskeletal:        General: Normal range of motion.     Cervical back: Normal range of motion and neck supple.     Right lower leg: No edema.     Left lower leg: No edema.     Comments: No edema of the hands or feet   Skin:    General: Skin is warm and dry.     Capillary Refill: Capillary refill takes less than 2 seconds.  Neurological:     General: No focal deficit present.     Mental Status: She is alert and oriented to person, place, and time.     Deep Tendon Reflexes: Reflexes normal.  Psychiatric:        Mood and Affect: Mood normal.        Behavior: Behavior normal.     ED Results / Procedures / Treatments   Labs (all labs ordered are listed, but only abnormal results are displayed) Results for orders placed or performed during the hospital encounter of 06/28/19  Urinalysis, Routine w reflex microscopic  Result Value Ref Range   Color, Urine YELLOW YELLOW   APPearance HAZY (A) CLEAR   Specific Gravity, Urine 1.010 1.005 - 1.030   pH 6.5 5.0 - 8.0   Glucose, UA NEGATIVE  NEGATIVE mg/dL   Hgb urine dipstick NEGATIVE NEGATIVE   Bilirubin Urine NEGATIVE NEGATIVE   Ketones, ur NEGATIVE NEGATIVE mg/dL   Protein, ur NEGATIVE NEGATIVE mg/dL   Nitrite NEGATIVE NEGATIVE   Leukocytes,Ua TRACE (A) NEGATIVE  Urinalysis, Microscopic (reflex)  Result Value Ref Range   RBC / HPF 0-5 0 - 5 RBC/hpf   WBC,  UA 0-5 0 - 5 WBC/hpf   Bacteria, UA MANY (A) NONE SEEN   Squamous Epithelial / LPF 11-20 0 - 5  CBC with Differential/Platelet  Result Value Ref Range   WBC 6.6 4.0 - 10.5 K/uL   RBC 3.43 (L) 3.87 - 5.11 MIL/uL   Hemoglobin 11.3 (L) 12.0 - 15.0 g/dL   HCT 33.7 (L) 36.0 - 46.0 %   MCV 98.3 80.0 - 100.0 fL   MCH 32.9 26.0 - 34.0 pg   MCHC 33.5 30.0 - 36.0 g/dL   RDW 13.5 11.5 - 15.5 %   Platelets 243 150 - 400 K/uL   nRBC 0.0 0.0 - 0.2 %   Neutrophils Relative % 57 %   Neutro Abs 3.8 1.7 - 7.7 K/uL   Lymphocytes Relative 34 %   Lymphs Abs 2.2 0.7 - 4.0 K/uL   Monocytes Relative 7 %   Monocytes Absolute 0.5 0.1 - 1.0 K/uL   Eosinophils Relative 1 %   Eosinophils Absolute 0.1 0.0 - 0.5 K/uL   Basophils Relative 0 %   Basophils Absolute 0.0 0.0 - 0.1 K/uL   Immature Granulocytes 1 %   Abs Immature Granulocytes 0.03 0.00 - 0.07 K/uL  Basic metabolic panel  Result Value Ref Range   Sodium 136 135 - 145 mmol/L   Potassium 3.1 (L) 3.5 - 5.1 mmol/L   Chloride 105 98 - 111 mmol/L   CO2 23 22 - 32 mmol/L   Glucose, Bld 90 70 - 99 mg/dL   BUN 8 6 - 20 mg/dL   Creatinine, Ser 0.44 0.44 - 1.00 mg/dL   Calcium 8.9 8.9 - 10.3 mg/dL   GFR calc non Af Amer >60 >60 mL/min   GFR calc Af Amer >60 >60 mL/min   Anion gap 8 5 - 15   No results found.  EKG None  Radiology No results found.  Procedures Procedures (including critical care time)  Medications Ordered in ED Medications  ondansetron (ZOFRAN) injection 4 mg (4 mg Intravenous Given 06/28/19 2358)  sodium chloride 0.9 % bolus 500 mL ( Intravenous Stopped 06/29/19 0102)  cefTRIAXone (ROCEPHIN) 1 g in  sodium chloride 0.9 % 100 mL IVPB ( Intravenous Stopped 06/29/19 0029)  acetaminophen (TYLENOL) tablet 1,000 mg (1,000 mg Oral Given 06/29/19 0151)    ED Course  I have reviewed the triage vital signs and the nursing notes.  Pertinent labs & imaging results that were available during my care of the patient were reviewed by me and considered in my medical decision making (see chart for details).    Exam and vitals are benign and reassuring.  Patient with confirmed IUP.  Normal fetal heart tones.  Patient with UTI.  Treated in the ED.  Feels improved.  PO challenged successfully.  No indication for further imaging at this time.  No signs of sepsis.     Laray Rivkin was evaluated in Emergency Department on 06/29/2019 for the symptoms described in the history of present illness. She was evaluated in the context of the global COVID-19 pandemic, which necessitated consideration that the patient might be at risk for infection with the SARS-CoV-2 virus that causes COVID-19. Institutional protocols and algorithms that pertain to the evaluation of patients at risk for COVID-19 are in a state of rapid change based on information released by regulatory bodies including the CDC and federal and state organizations. These policies and algorithms were followed during the patient's care in the ED.  Final Clinical Impression(s) / ED  Diagnoses Final diagnoses:  Nausea and vomiting in pregnancy  Acute cystitis without hematuria  Hypokalemia   Return for weakness, numbness, changes in vision or speech, fevers >100.4 unrelieved by medication, shortness of breath, intractable vomiting, or diarrhea, abdominal pain, Inability to tolerate liquids or food, cough, altered mental status or any concerns. No signs of systemic illness or infection. The patient is nontoxic-appearing on exam and vital signs are within normal limits.   I have reviewed the triage vital signs and the nursing notes. Pertinent labs  &imaging results that were available during my care of the patient were reviewed by me and considered in my medical decision making (see chart for details).  After history, exam, and medical workup I feel the patient has been appropriately medically screened and is safe for discharge home. Pertinent diagnoses were discussed with the patient. Patient was given return precautions    Rx / DC Orders ED Discharge Orders         Ordered    nitrofurantoin, macrocrystal-monohydrate, (MACROBID) 100 MG capsule  2 times daily     06/29/19 0147    potassium chloride SA (KLOR-CON) 20 MEQ tablet  Daily     06/29/19 0147           Paislie Tessler, MD 06/29/19 8115

## 2019-07-04 ENCOUNTER — Encounter: Payer: Self-pay | Admitting: *Deleted

## 2019-07-04 DIAGNOSIS — Z3480 Encounter for supervision of other normal pregnancy, unspecified trimester: Secondary | ICD-10-CM

## 2019-07-05 ENCOUNTER — Encounter: Payer: Self-pay | Admitting: Certified Nurse Midwife

## 2019-07-18 ENCOUNTER — Ambulatory Visit (HOSPITAL_COMMUNITY): Payer: Self-pay

## 2019-08-06 ENCOUNTER — Encounter (HOSPITAL_COMMUNITY): Payer: Self-pay | Admitting: Family Medicine

## 2019-08-06 ENCOUNTER — Other Ambulatory Visit: Payer: Self-pay

## 2019-08-06 ENCOUNTER — Inpatient Hospital Stay (HOSPITAL_COMMUNITY)
Admission: AD | Admit: 2019-08-06 | Discharge: 2019-08-06 | Disposition: A | Discharge status: Home / Self Care | Payer: Medicaid Other | Attending: Family Medicine | Admitting: Family Medicine

## 2019-08-06 DIAGNOSIS — Y929 Unspecified place or not applicable: Secondary | ICD-10-CM | POA: Insufficient documentation

## 2019-08-06 DIAGNOSIS — O26899 Other specified pregnancy related conditions, unspecified trimester: Secondary | ICD-10-CM

## 2019-08-06 DIAGNOSIS — M542 Cervicalgia: Secondary | ICD-10-CM | POA: Insufficient documentation

## 2019-08-06 DIAGNOSIS — Z79899 Other long term (current) drug therapy: Secondary | ICD-10-CM | POA: Insufficient documentation

## 2019-08-06 DIAGNOSIS — Z3A21 21 weeks gestation of pregnancy: Secondary | ICD-10-CM | POA: Insufficient documentation

## 2019-08-06 DIAGNOSIS — O10012 Pre-existing essential hypertension complicating pregnancy, second trimester: Secondary | ICD-10-CM | POA: Diagnosis not present

## 2019-08-06 DIAGNOSIS — S134XXA Sprain of ligaments of cervical spine, initial encounter: Secondary | ICD-10-CM | POA: Insufficient documentation

## 2019-08-06 DIAGNOSIS — S39012A Strain of muscle, fascia and tendon of lower back, initial encounter: Secondary | ICD-10-CM | POA: Diagnosis not present

## 2019-08-06 DIAGNOSIS — O26892 Other specified pregnancy related conditions, second trimester: Secondary | ICD-10-CM | POA: Insufficient documentation

## 2019-08-06 DIAGNOSIS — R109 Unspecified abdominal pain: Secondary | ICD-10-CM | POA: Insufficient documentation

## 2019-08-06 DIAGNOSIS — Y939 Activity, unspecified: Secondary | ICD-10-CM | POA: Insufficient documentation

## 2019-08-06 DIAGNOSIS — R102 Pelvic and perineal pain: Secondary | ICD-10-CM | POA: Insufficient documentation

## 2019-08-06 DIAGNOSIS — T7431XA Adult psychological abuse, confirmed, initial encounter: Secondary | ICD-10-CM

## 2019-08-06 DIAGNOSIS — O9A212 Injury, poisoning and certain other consequences of external causes complicating pregnancy, second trimester: Secondary | ICD-10-CM

## 2019-08-06 LAB — URINALYSIS, ROUTINE W REFLEX MICROSCOPIC
Bilirubin Urine: NEGATIVE
Glucose, UA: NEGATIVE mg/dL
Hgb urine dipstick: NEGATIVE
Ketones, ur: NEGATIVE mg/dL
Leukocytes,Ua: NEGATIVE
Nitrite: NEGATIVE
Protein, ur: NEGATIVE mg/dL
Specific Gravity, Urine: 1.004 — ABNORMAL LOW (ref 1.005–1.030)
pH: 6 (ref 5.0–8.0)

## 2019-08-06 MED ORDER — CYCLOBENZAPRINE HCL 5 MG PO TABS: 5.0000 mg | ORAL_TABLET | Freq: Three times a day (TID) | ORAL | 0 refills | Status: DC | PRN

## 2019-08-06 MED ORDER — CYCLOBENZAPRINE HCL 5 MG PO TABS
10.0000 mg | ORAL_TABLET | Freq: Once | ORAL | Status: AC
  Administered 2019-08-06: 10 mg via ORAL
  Filled 2019-08-06: qty 2

## 2019-08-06 MED ORDER — IBUPROFEN 800 MG PO TABS
400.0000 mg | ORAL_TABLET | Freq: Once | ORAL | Status: AC
  Administered 2019-08-06: 23:00:00 400 mg via ORAL
  Filled 2019-08-06: qty 1
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
## 2019-10-11 ENCOUNTER — Other Ambulatory Visit: Payer: Self-pay

## 2019-10-11 ENCOUNTER — Inpatient Hospital Stay (HOSPITAL_COMMUNITY)
Admission: AD | Admit: 2019-10-11 | Discharge: 2019-10-12 | Disposition: A | Discharge status: Home / Self Care | Payer: Medicaid Other | Attending: Obstetrics and Gynecology | Admitting: Obstetrics and Gynecology

## 2019-10-11 ENCOUNTER — Encounter (HOSPITAL_COMMUNITY): Payer: Self-pay | Admitting: Obstetrics and Gynecology

## 2019-10-11 DIAGNOSIS — O99613 Diseases of the digestive system complicating pregnancy, third trimester: Secondary | ICD-10-CM | POA: Insufficient documentation

## 2019-10-11 DIAGNOSIS — R109 Unspecified abdominal pain: Secondary | ICD-10-CM

## 2019-10-11 DIAGNOSIS — O219 Vomiting of pregnancy, unspecified: Secondary | ICD-10-CM | POA: Insufficient documentation

## 2019-10-11 DIAGNOSIS — O99283 Endocrine, nutritional and metabolic diseases complicating pregnancy, third trimester: Secondary | ICD-10-CM | POA: Insufficient documentation

## 2019-10-11 DIAGNOSIS — R12 Heartburn: Secondary | ICD-10-CM | POA: Insufficient documentation

## 2019-10-11 DIAGNOSIS — R112 Nausea with vomiting, unspecified: Secondary | ICD-10-CM

## 2019-10-11 DIAGNOSIS — Z880 Allergy status to penicillin: Secondary | ICD-10-CM | POA: Insufficient documentation

## 2019-10-11 DIAGNOSIS — O26893 Other specified pregnancy related conditions, third trimester: Secondary | ICD-10-CM | POA: Insufficient documentation

## 2019-10-11 DIAGNOSIS — O99013 Anemia complicating pregnancy, third trimester: Secondary | ICD-10-CM | POA: Insufficient documentation

## 2019-10-11 DIAGNOSIS — R1013 Epigastric pain: Secondary | ICD-10-CM | POA: Insufficient documentation

## 2019-10-11 DIAGNOSIS — D649 Anemia, unspecified: Secondary | ICD-10-CM | POA: Insufficient documentation

## 2019-10-11 DIAGNOSIS — E876 Hypokalemia: Secondary | ICD-10-CM | POA: Insufficient documentation

## 2019-10-11 DIAGNOSIS — O10913 Unspecified pre-existing hypertension complicating pregnancy, third trimester: Secondary | ICD-10-CM | POA: Insufficient documentation

## 2019-10-11 DIAGNOSIS — Z79899 Other long term (current) drug therapy: Secondary | ICD-10-CM | POA: Insufficient documentation

## 2019-10-11 DIAGNOSIS — Z3A31 31 weeks gestation of pregnancy: Secondary | ICD-10-CM | POA: Insufficient documentation

## 2019-10-11 DIAGNOSIS — Z8249 Family history of ischemic heart disease and other diseases of the circulatory system: Secondary | ICD-10-CM | POA: Insufficient documentation

## 2019-10-11 DIAGNOSIS — O26899 Other specified pregnancy related conditions, unspecified trimester: Secondary | ICD-10-CM

## 2019-10-11 DIAGNOSIS — R1011 Right upper quadrant pain: Secondary | ICD-10-CM
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
## 2019-10-12 ENCOUNTER — Inpatient Hospital Stay (HOSPITAL_COMMUNITY): Payer: Medicaid Other

## 2019-10-12 DIAGNOSIS — O219 Vomiting of pregnancy, unspecified: Secondary | ICD-10-CM | POA: Diagnosis not present

## 2019-10-12 DIAGNOSIS — O99013 Anemia complicating pregnancy, third trimester: Secondary | ICD-10-CM | POA: Diagnosis not present

## 2019-10-12 DIAGNOSIS — Z3A31 31 weeks gestation of pregnancy: Secondary | ICD-10-CM | POA: Diagnosis not present

## 2019-10-12 DIAGNOSIS — Z8249 Family history of ischemic heart disease and other diseases of the circulatory system: Secondary | ICD-10-CM | POA: Diagnosis not present

## 2019-10-12 DIAGNOSIS — O99613 Diseases of the digestive system complicating pregnancy, third trimester: Secondary | ICD-10-CM | POA: Diagnosis not present

## 2019-10-12 DIAGNOSIS — O26893 Other specified pregnancy related conditions, third trimester: Secondary | ICD-10-CM | POA: Diagnosis present

## 2019-10-12 DIAGNOSIS — Z79899 Other long term (current) drug therapy: Secondary | ICD-10-CM | POA: Diagnosis not present

## 2019-10-12 DIAGNOSIS — R12 Heartburn: Secondary | ICD-10-CM

## 2019-10-12 DIAGNOSIS — R109 Unspecified abdominal pain: Secondary | ICD-10-CM

## 2019-10-12 DIAGNOSIS — O99283 Endocrine, nutritional and metabolic diseases complicating pregnancy, third trimester: Secondary | ICD-10-CM | POA: Diagnosis not present

## 2019-10-12 DIAGNOSIS — D649 Anemia, unspecified: Secondary | ICD-10-CM

## 2019-10-12 DIAGNOSIS — E876 Hypokalemia: Secondary | ICD-10-CM | POA: Diagnosis not present

## 2019-10-12 DIAGNOSIS — Z880 Allergy status to penicillin: Secondary | ICD-10-CM | POA: Diagnosis not present

## 2019-10-12 DIAGNOSIS — O10913 Unspecified pre-existing hypertension complicating pregnancy, third trimester: Secondary | ICD-10-CM | POA: Diagnosis not present

## 2019-10-12 DIAGNOSIS — R1011 Right upper quadrant pain: Secondary | ICD-10-CM

## 2019-10-12 DIAGNOSIS — R1013 Epigastric pain: Secondary | ICD-10-CM | POA: Diagnosis not present

## 2019-10-12 LAB — PROTEIN / CREATININE RATIO, URINE
Creatinine, Urine: 143.38 mg/dL
Protein Creatinine Ratio: 0.12 mg/mg{Cre} (ref 0.00–0.15)
Total Protein, Urine: 17 mg/dL

## 2019-10-12 LAB — URINALYSIS, ROUTINE W REFLEX MICROSCOPIC
Bilirubin Urine: NEGATIVE
Glucose, UA: 50 mg/dL — AB
Hgb urine dipstick: NEGATIVE
Ketones, ur: NEGATIVE mg/dL
Leukocytes,Ua: NEGATIVE
Nitrite: NEGATIVE
Protein, ur: NEGATIVE mg/dL
Specific Gravity, Urine: 1.021 (ref 1.005–1.030)
pH: 6 (ref 5.0–8.0)

## 2019-10-12 LAB — COMPREHENSIVE METABOLIC PANEL
ALT: 11 U/L (ref 0–44)
AST: 15 U/L (ref 15–41)
Albumin: 2.7 g/dL — ABNORMAL LOW (ref 3.5–5.0)
Alkaline Phosphatase: 65 U/L (ref 38–126)
Anion gap: 8 (ref 5–15)
BUN: 5 mg/dL — ABNORMAL LOW (ref 6–20)
CO2: 22 mmol/L (ref 22–32)
Calcium: 8.6 mg/dL — ABNORMAL LOW (ref 8.9–10.3)
Chloride: 108 mmol/L (ref 98–111)
Creatinine, Ser: 0.43 mg/dL — ABNORMAL LOW (ref 0.44–1.00)
GFR calc Af Amer: >60 mL/min (ref >60)
GFR calc non Af Amer: >60 mL/min (ref >60)
Glucose, Bld: 95 mg/dL (ref 70–99)
Potassium: 3.3 mmol/L — ABNORMAL LOW (ref 3.5–5.1)
Sodium: 138 mmol/L (ref 135–145)
Total Bilirubin: 0.2 mg/dL — ABNORMAL LOW (ref 0.3–1.2)
Total Protein: 5.9 g/dL — ABNORMAL LOW (ref 6.5–8.1)

## 2019-10-12 LAB — CBC
HCT: 29.3 % — ABNORMAL LOW (ref 36.0–46.0)
Hemoglobin: 9.8 g/dL — ABNORMAL LOW (ref 12.0–15.0)
MCH: 31.9 pg (ref 26.0–34.0)
MCHC: 33.4 g/dL (ref 30.0–36.0)
MCV: 95.4 fL (ref 80.0–100.0)
Platelets: 231 10*3/uL (ref 150–400)
RBC: 3.07 MIL/uL — ABNORMAL LOW (ref 3.87–5.11)
RDW: 12.4 % (ref 11.5–15.5)
WBC: 7.5 10*3/uL (ref 4.0–10.5)
nRBC: 0 % (ref 0.0–0.2)

## 2019-10-12 MED ORDER — COMFORT FIT MATERNITY SUPP LG MISC: 1.0000 [IU] | Freq: Every day | 0 refills | Status: DC

## 2019-10-12 MED ORDER — ACETAMINOPHEN 500 MG PO TABS
1000.0000 mg | ORAL_TABLET | Freq: Once | ORAL | Status: AC
  Administered 2019-10-12: 1000 mg via ORAL
  Filled 2019-10-12: qty 2

## 2019-10-12 MED ORDER — FAMOTIDINE 20 MG PO TABS: 20.0000 mg | ORAL_TABLET | Freq: Two times a day (BID) | ORAL | 1 refills | Status: DC

## 2019-10-12 MED ORDER — FERROUS SULFATE 325 (65 FE) MG PO TABS: 325.0000 mg | ORAL_TABLET | Freq: Two times a day (BID) | ORAL | 2 refills | Status: DC

## 2019-10-12 MED ORDER — DICYCLOMINE HCL 10 MG/5ML PO SOLN
10.0000 mg | Freq: Once | ORAL | Status: AC
  Administered 2019-10-12: 10 mg via ORAL
  Filled 2019-10-12: qty 5

## 2019-10-12 MED ORDER — ALUM & MAG HYDROXIDE-SIMETH 200-200-20 MG/5ML PO SUSP
30.0000 mL | Freq: Once | ORAL | Status: AC
  Administered 2019-10-12: 30 mL via ORAL
  Filled 2019-10-12: qty 30

## 2019-10-12 MED ORDER — POTASSIUM CHLORIDE ER 10 MEQ PO TBCR: 10.0000 meq | EXTENDED_RELEASE_TABLET | Freq: Every day | ORAL | 0 refills | Status: DC

## 2019-10-12 MED ORDER — LIDOCAINE VISCOUS HCL 2 % MT SOLN
15.0000 mL | Freq: Once | OROMUCOSAL | Status: AC
  Administered 2019-10-12: 15 mL via ORAL
  Filled 2019-10-12: qty 15

## 2019-10-12 NOTE — MAU Provider Note (Signed)
History     CSN: [id number]  Arrival date and time: 10/11/19 2313   First Provider Initiated Contact with Patient 10/12/19 0022      Chief Complaint  Patient presents with  . Abdominal Pain   Alicia Gilbert is a 34 y.o. G5P3 at [redacted]w[redacted]d who presents to MAU with complaints of abdominal pain. Patient reports 2 different types of abdominal pain. Reports epigastric pain started occurring yesterday, describes pain as burning sensation that radiates to her right. Reports pain is associated with nausea and vomiting. Water helps relieve pain but food makes it worse. She reports lower abdominal pain has been occurring since past Wednesday. Describes as sharp stabbing pain that occurs when she is at work, gets worse with walking. Pain is relieved when she lays down. Denies lower abdominal pain currently but reports epigastric pain is a 9/10. Has taken tylenol for pain early today, helped lower abdominal pain but did not help epigastric pain. She denies vaginal discharge, bleeding or LOF. +FM.   Spanish interpreter used throughout MAU visit   OB History    Gravida  5   Para  3   Term  3   Preterm  0   AB  1   Living  3     SAB  1   TAB  0   Ectopic  0   Multiple      Live Births  3           Past Medical History:  Diagnosis Date  . Heart murmur    childhood  . Hypertension   . Kidney infection     Past Surgical History:  Procedure Laterality Date  . APPENDECTOMY      Family History  Problem Relation Age of Onset  . Heart disease Mother   . Heart disease Father     Social History   Tobacco Use  . Smoking status: Never Smoker  . Smokeless tobacco: Never Used  Substance Use Topics  . Alcohol use: Never  . Drug use: Never    Allergies:  Allergies  Allergen Reactions  . Penicillins   . Penicillins Hives and Rash    Leg pains    Medications Prior to Admission  Medication Sig Dispense Refill Last Dose  . acetaminophen (TYLENOL) 325 MG tablet  Take 650 mg by mouth every 6 (six) hours as needed.   10/11/2019 at 1800  . Prenatal Vit-Fe Fumarate-FA (PREPLUS) 27-1 MG TABS Take 1 tablet by mouth daily. 30 tablet 13 10/11/2019 at Unknown time  . cyclobenzaprine (FLEXERIL) 5 MG tablet Take 1 tablet (5 mg total) by mouth 3 (three) times daily as needed for muscle spasms. 20 tablet 0 DIDN'T PICK UP    Review of Systems  Constitutional: Negative.   Respiratory: Negative.   Cardiovascular: Negative.   Gastrointestinal: Positive for abdominal pain, nausea and vomiting. Negative for constipation and diarrhea.  Genitourinary: Negative.   Musculoskeletal: Negative.   Neurological: Negative.   Psychiatric/Behavioral: Negative.    Physical Exam   Blood pressure 112/63, pulse 81, temperature 98.7 F (37.1 C), resp. rate 20, weight 87 kg, last menstrual period 02/21/2019, unknown if currently breastfeeding.  Physical Exam  Nursing note and vitals reviewed. Constitutional: She is oriented to person, place, and time. She appears well-developed and well-nourished. No distress.  Cardiovascular: Normal rate and regular rhythm.  Respiratory: Effort normal and breath sounds normal. No respiratory distress. She has no wheezes.  GI: Soft. There is abdominal tenderness. There is guarding. There  is no rebound.  Gravid, epigastric tenderness and positive Murphy's sign   Musculoskeletal:        General: No edema. Normal range of motion.  Neurological: She is alert and oriented to person, place, and time.  Psychiatric: She has a normal mood and affect. Her behavior is normal. Thought content normal.   FHR: 135/moderate/+accels/ no decelerations  Toco: no UC  MAU Course  Procedures  MDM Orders Placed This Encounter  Procedures  . US ABDOMEN LIMITED RUQ  . Urinalysis, Routine w reflex microscopic  . CBC  . Comprehensive metabolic panel  . Protein / creatinine ratio, urine   Meds ordered this encounter  Medications  . AND Linked Order Group   .  alum & mag hydroxide-simeth (MAALOX/MYLANTA) 200-200-20 MG/5ML suspension 30 mL   . lidocaine (XYLOCAINE) 2 % viscous mouth solution 15 mL  . dicyclomine (BENTYL) 10 MG/5ML solution 10 mg  . acetaminophen (TYLENOL) tablet 1,000 mg   RUQ [place] ordered to r/o gallstones with clinical symptoms, patient reports last to eat/drink was around 1900, has been over 5 hours.   [place] report reviewed:  US ABDOMEN LIMITED RUQ  Result Date: 10/12/2019 CLINICAL DATA:  Right upper quadrant pain. Nausea and vomiting. Pregnant patient at [redacted] weeks gestation. EXAM: ULTRASOUND ABDOMEN LIMITED RIGHT UPPER QUADRANT COMPARISON:  Right upper quadrant ultrasound 01/09/2019 FINDINGS: Gallbladder: Physiologically distended. No gallstones or wall thickening visualized. No sonographic Murphy sign noted by sonographer. Common bile duct: Diameter: 2-3 mm, normal were visualized. Liver: No focal lesion identified. Borderline increased in parenchymal echogenicity. Portal vein is patent on color Doppler imaging with normal direction of blood flow towards the liver. Other: No right upper quadrant free fluid. IMPRESSION: 1. Normal sonographic appearance of gallbladder and biliary tree. 2. Borderline hepatic steatosis. Electronically Signed   By: Narda Rutherford M.D.   On: 10/12/2019 01:11   Normal [place]  GI cocktail and tylenol ordered @ 0115, given @ 0134 Reassessment @ 0215, patient reports lower abdominal pain is resolved and epigastric pain has decreased significantly, rates now 3/10.  NST reactive and reassuring   Labs reviewed:  Results for orders placed or performed during the hospital encounter of 10/11/19 (from the past 24 hour(s))  Urinalysis, Routine w reflex microscopic     Status: Abnormal   Collection Time: 10/12/19  1:19 AM  Result Value Ref Range   Color, Urine YELLOW YELLOW   APPearance HAZY (A) CLEAR   Specific Gravity, Urine 1.021 1.005 - 1.030   pH 6.0 5.0 - 8.0   Glucose, UA 50 (A) NEGATIVE mg/dL   Hgb urine  dipstick NEGATIVE NEGATIVE   Bilirubin Urine NEGATIVE NEGATIVE   Ketones, ur NEGATIVE NEGATIVE mg/dL   Protein, ur NEGATIVE NEGATIVE mg/dL   Nitrite NEGATIVE NEGATIVE   Leukocytes,Ua NEGATIVE NEGATIVE  Protein / creatinine ratio, urine     Status: None   Collection Time: 10/12/19  1:19 AM  Result Value Ref Range   Creatinine, Urine 143.38 mg/dL   Total Protein, Urine 17 mg/dL   Protein Creatinine Ratio 0.12 0.00 - 0.15 mg/mg[Cre]  CBC     Status: Abnormal   Collection Time: 10/12/19  1:47 AM  Result Value Ref Range   WBC 7.5 4.0 - 10.5 K/uL   RBC 3.07 (L) 3.87 - 5.11 MIL/uL   Hemoglobin 9.8 (L) 12.0 - 15.0 g/dL   HCT 78.2 (L) 95.6 - 21.3 %   MCV 95.4 80.0 - 100.0 fL   MCH 31.9 26.0 - 34.0 pg  MCHC 33.4 30.0 - 36.0 g/dL   RDW 12.4 11.5 - 15.5 %   Platelets 231 150 - 400 K/uL   nRBC 0.0 0.0 - 0.2 %  Comprehensive metabolic panel     Status: Abnormal   Collection Time: 10/12/19  1:47 AM  Result Value Ref Range   Sodium 138 135 - 145 mmol/L   Potassium 3.3 (L) 3.5 - 5.1 mmol/L   Chloride 108 98 - 111 mmol/L   CO2 22 22 - 32 mmol/L   Glucose, Bld 95 70 - 99 mg/dL   BUN 5 (L) 6 - 20 mg/dL   Creatinine, Ser 0.43 (L) 0.44 - 1.00 mg/dL   Calcium 8.6 (L) 8.9 - 10.3 mg/dL   Total Protein 5.9 (L) 6.5 - 8.1 g/dL   Albumin 2.7 (L) 3.5 - 5.0 g/dL   AST 15 15 - 41 U/L   ALT 11 0 - 44 U/L   Alkaline Phosphatase 65 38 - 126 U/L   Total Bilirubin 0.2 (L) 0.3 - 1.2 mg/dL   GFR calc non Af Amer >60 >60 mL/min   GFR calc Af Amer >60 >60 mL/min   Anion gap 8 5 - 15   Educated and discussed lab results with patient. Amenia and Hypokalemia noted based on lab work. Rx for iron supplementation and Kdur sent to pharmacy of choice, in addition to pepcid for heartburn and Rx for maternity support belt.   Discussed reasons to return to MAU. Follow up as scheduled in the office. Return to MAU as needed. Pt stable at time of discharge.   Assessment and Plan   1. Abdominal pain during pregnancy in  third trimester   2. Abdominal pain affecting pregnancy   3. Abdominal pain, acute, right upper quadrant   4. Nausea and vomiting   5. Heartburn during pregnancy in third trimester   6. Pain of round ligament during pregnancy   7. Anemia affecting pregnancy in third trimester   8. Hypokalemia    Discharge home Follow up as scheduled in the office for prenatal care Return to MAU as needed for reasons discussed and/or emergencies  Rx for pepcid, maternity support belt, ferrous sulfate, and Kdur     Allergies as of 10/12/2019      Reactions   Penicillins    Penicillins Hives, Rash   Leg pains      Medication List    TAKE these medications   acetaminophen 325 MG tablet Commonly known as: TYLENOL Take 650 mg by mouth every 6 (six) hours as needed.   Comfort Fit Maternity Supp Lg Misc 1 Units by Does not apply route daily.   cyclobenzaprine 5 MG tablet Commonly known as: FLEXERIL Take 1 tablet (5 mg total) by mouth 3 (three) times daily as needed for muscle spasms.   famotidine 20 MG tablet Commonly known as: PEPCID Take 1 tablet (20 mg total) by mouth 2 (two) times daily.   ferrous sulfate 325 (65 FE) MG tablet Take 1 tablet (325 mg total) by mouth 2 (two) times daily with a meal.   potassium chloride 10 MEQ tablet Commonly known as: KLOR-CON Take 1 tablet (10 mEq total) by mouth daily.   PrePLUS 27-1 MG Tabs Take 1 tablet by mouth daily.       Lajean Manes CNM 10/12/2019, 4:26 AM

## 2019-10-12 NOTE — Discharge Instructions (Signed)

## 2020-01-09 ENCOUNTER — Encounter (HOSPITAL_COMMUNITY): Payer: Self-pay | Admitting: Obstetrics & Gynecology

## 2020-01-09 ENCOUNTER — Inpatient Hospital Stay (HOSPITAL_COMMUNITY)
Admission: AD | Admit: 2020-01-09 | Discharge: 2020-01-10 | Disposition: A | Discharge status: Home / Self Care | Payer: Medicaid Other | Attending: Obstetrics & Gynecology | Admitting: Obstetrics & Gynecology

## 2020-01-09 ENCOUNTER — Other Ambulatory Visit: Payer: Self-pay

## 2020-01-09 DIAGNOSIS — O99893 Other specified diseases and conditions complicating puerperium: Secondary | ICD-10-CM | POA: Diagnosis present

## 2020-01-09 DIAGNOSIS — Z79899 Other long term (current) drug therapy: Secondary | ICD-10-CM | POA: Insufficient documentation

## 2020-01-09 DIAGNOSIS — R531 Weakness: Secondary | ICD-10-CM | POA: Insufficient documentation

## 2020-01-09 DIAGNOSIS — R102 Pelvic and perineal pain: Secondary | ICD-10-CM | POA: Diagnosis not present

## 2020-01-09 DIAGNOSIS — E876 Hypokalemia: Secondary | ICD-10-CM | POA: Diagnosis not present

## 2020-01-09 DIAGNOSIS — I10 Essential (primary) hypertension: Secondary | ICD-10-CM | POA: Insufficient documentation

## 2020-01-09 DIAGNOSIS — R509 Fever, unspecified: Secondary | ICD-10-CM | POA: Insufficient documentation

## 2020-01-09 DIAGNOSIS — N939 Abnormal uterine and vaginal bleeding, unspecified: Secondary | ICD-10-CM | POA: Insufficient documentation

## 2020-01-09 DIAGNOSIS — O8612 Endometritis following delivery: Secondary | ICD-10-CM | POA: Insufficient documentation

## 2020-01-09 DIAGNOSIS — O99285 Endocrine, nutritional and metabolic diseases complicating the puerperium: Secondary | ICD-10-CM

## 2020-01-09 DIAGNOSIS — M549 Dorsalgia, unspecified: Secondary | ICD-10-CM | POA: Diagnosis not present

## 2020-01-09 MED ORDER — SODIUM CHLORIDE 0.9 % IV SOLN
2.0000 g | Freq: Once | INTRAVENOUS | Status: DC
  Filled 2020-01-09: qty 20

## 2020-01-09 MED ORDER — METRONIDAZOLE IN NACL 5-0.79 MG/ML-% IV SOLN
500.0000 mg | Freq: Once | INTRAVENOUS | Status: DC
  Filled 2020-01-09: qty 100

## 2020-01-09 MED ORDER — IBUPROFEN 600 MG PO TABS
600.0000 mg | ORAL_TABLET | Freq: Once | ORAL | Status: AC
  Administered 2020-01-09: 600 mg via ORAL
  Filled 2020-01-09: qty 1

## 2020-01-09 MED ORDER — OXYCODONE-ACETAMINOPHEN 5-325 MG PO TABS
1.0000 | ORAL_TABLET | Freq: Once | ORAL | Status: AC
  Administered 2020-01-09: 1 via ORAL
  Filled 2020-01-09: qty 1

## 2020-01-09 MED ORDER — CLINDAMYCIN HCL 300 MG PO CAPS
600.0000 mg | ORAL_CAPSULE | Freq: Once | ORAL | Status: AC
  Administered 2020-01-10: 600 mg via ORAL
  Filled 2020-01-09: qty 2

## 2020-01-09 MED ORDER — LACTATED RINGERS IV SOLN: INTRAVENOUS | Status: DC

## 2020-01-09 NOTE — MAU Note (Signed)
PT SAYS  WITH INTERPRETER- [facility]- [place] , DELIVERED [place]- VAG - 7-30.   CAME TO [place] -8-6- WAS OK.    SAYS HAD COMPLICATIONS WITH DEL - HAD EPIDURAL- AFTER DEL - COULD NOT STAND UP- SO USES WALKER . [place] - WENT TO THERAPY X3 .   HAS AN APPOINTMENT WITH OB IN [place]- TO GET AN APPOINTMENT FOR THERAPY.   ON Tuesday NIGHT HAD FEVER- DID NOT CHECK-AND NO MEDS.        WED- HAD FEVER- 102-103- NO MEDS.    THURS- WENT TO [facility] - LABS, URINE- WAITED X8 HRS - AND NOT SEEN.  NO MEDS.         BREAST FEEDING .   ALSO HAS PAIN IN ABD AND HER BACK- STARTED 1ST WEEK AFTER DEL.   ALSO PASSES CLOTS- GOLF BALL SIZE AND SMALLER- WITH BAD ODOR.

## 2020-01-09 NOTE — MAU Provider Note (Addendum)
Chief Complaint:  Fever   First Provider Initiated Contact with Patient 01/09/20 2306      HPI: Alicia Gilbert is a 34 y.o. [phone] who presents to maternity admissions reporting fever, pelvic pain, passing blood clots, and back pain with leg weakness (since delivery related to epidural )   Patient got care in [place] then went to [place] and delivered there.  Went to [facility] ED and waited many hours before coming here. . She reports vaginal bleeding, but no  vaginal itching/burning, urinary symptoms, h/a, dizziness, n/v    Fever  This is a new problem. The current episode started in the past 7 days. The problem occurs constantly. The problem has been unchanged. The maximum temperature noted was 102 to 102.9 F. The temperature was taken using an oral thermometer. Associated symptoms include abdominal pain. Pertinent negatives include no congestion, coughing, diarrhea, headaches, muscle aches, nausea, sore throat, urinary pain or vomiting. She has tried nothing for the symptoms.  Abdominal Pain The current episode started in the past 7 days. The onset quality is gradual. The problem occurs constantly. The problem has been unchanged. The quality of the pain is aching and cramping. The abdominal pain does not radiate. Associated symptoms include a fever. Pertinent negatives include no constipation, diarrhea, dysuria, frequency, headaches, myalgias, nausea or vomiting. Nothing aggravates the pain. The pain is relieved by nothing. She has tried nothing for the symptoms.   RN Note: PT SAYS  WITH INTERPRETER- [facility] , DELIVERED [place]- VAG - 7-30.   CAME TO [place] -8-6- WAS OK.    SAYS HAD COMPLICATIONS WITH DEL - HAD EPIDURAL- AFTER DEL - COULD NOT STAND UP- SO USES WALKER . [place] - WENT TO THERAPY X3 .   HAS AN APPOINTMENT WITH OB IN [place]- TO GET AN APPOINTMENT FOR THERAPY.   ON Tuesday NIGHT HAD FEVER- DID NOT CHECK-AND NO MEDS.        WED- HAD FEVER- 102-103- NO MEDS.    THURS- WENT  TO [facility] - LABS, URINE- WAITED X8 HRS - AND NOT SEEN.  NO MEDS.         BREAST FEEDING .   ALSO HAS PAIN IN ABD AND HER BACK- STARTED 1ST WEEK AFTER DEL.   ALSO PASSES CLOTS- GOLF BALL SIZE AND SMALLER- WITH BAD ODOR.    Past Medical History: Past Medical History:  Diagnosis Date   Heart murmur    childhood   Hypertension    Kidney infection     Past obstetric history: OB History  Gravida Para Term Preterm AB Living  5 1 1  0 1 4  SAB TAB Ectopic Multiple Live Births  1 0 0   4    # Outcome Date GA Lbr Len/2nd Weight Sex Delivery Anes PTL Lv  5 Current           4 Term 2019     Vag-Spont   LIV  3 SAB 2013          2 Gravida      Vag-Spont   LIV  1 Gravida      Vag-Spont   LIV    Past Surgical History: Past Surgical History:  Procedure Laterality Date   APPENDECTOMY      Family History: Family History  Problem Relation Age of Onset   Heart disease Mother    Heart disease Father     Social History: Social History   Tobacco Use   Smoking status: Never  Smoker   Smokeless tobacco: Never Used  Vaping Use   Vaping Use: Never used  Substance Use Topics   Alcohol use: Never   Drug use: Never    Allergies:  Allergies  Allergen Reactions   Penicillins    Penicillins Hives and Rash    Leg pains    Meds:  Medications Prior to Admission  Medication Sig Dispense Refill Last Dose   cyclobenzaprine (FLEXERIL) 5 MG tablet Take 1 tablet (5 mg total) by mouth 3 (three) times daily as needed for muscle spasms. 20 tablet 0 Past Month at Unknown time   ferrous sulfate 325 (65 FE) MG tablet Take 1 tablet (325 mg total) by mouth 2 (two) times daily with a meal. 60 tablet 2 Past Week at Unknown time   folic acid (FOLVITE) 1 MG tablet Take 1 mg by mouth daily.   Past Week at Unknown time   potassium chloride (KLOR-CON) 10 MEQ tablet Take 1 tablet (10 mEq total) by mouth daily. 3 tablet 0 Past Week at Unknown time   Prenatal Vit-Fe Fumarate-FA (PREPLUS)  27-1 MG TABS Take 1 tablet by mouth daily. 30 tablet 13 Past Week at Unknown time   acetaminophen (TYLENOL) 325 MG tablet Take 650 mg by mouth every 6 (six) hours as needed.   More than a month at Unknown time   Elastic Bandages & Supports (COMFORT FIT MATERNITY SUPP LG) MISC 1 Units by Does not apply route daily. 1 each 0    famotidine (PEPCID) 20 MG tablet Take 1 tablet (20 mg total) by mouth 2 (two) times daily. 60 tablet 1 More than a month at Unknown time    I have reviewed patient's Past Medical Hx, Surgical Hx, Family Hx, Social Hx, medications and allergies.  ROS:  Review of Systems  Constitutional: Positive for fever.  HENT: Negative for congestion and sore throat.   Respiratory: Negative for cough.   Gastrointestinal: Positive for abdominal pain. Negative for constipation, diarrhea, nausea and vomiting.  Genitourinary: Negative for dysuria and frequency.  Musculoskeletal: Negative for myalgias.  Neurological: Negative for headaches.   Other systems negative     Physical Exam   Patient Vitals for the past 24 hrs:  BP Temp Temp src Pulse Resp Height Weight  01/09/20 2246 125/80 (!) 102.5 F (39.2 C) Oral (!) 102 20 5\' 4"  (1.626 m) 78.5 kg   Constitutional: Well-developed, well-nourished female in no acute distress.  Cardiovascular: normal rate and rhythm, no ectopy audible, S1 & S2 heard, no murmur Respiratory: normal effort, no distress. Lungs CTAB with no wheezes or crackles GI: Abd soft, non-tender.  Nondistended.  No rebound, No guarding.  Bowel Sounds audible  MS: Extremities nontender, no edema, normal ROM Neurologic: Alert and oriented x 4.   Grossly nonfocal. GU: Neg CVAT. Skin:  Warm and Dry Psych:  Affect appropriate.  PELVIC EXAM:  Bimanual exam: Cervix firm, anterior, neg CMT, uterus tender, nonenlarged, adnexa without tenderness, enlargement, or mass  Exam is limited by body habitus.  Labs: Results for orders placed or performed during the hospital  encounter of 01/09/20 (from the past 24 hour(s))  CBC with Differential/Platelet     Status: Abnormal   Collection Time: 01/09/20 11:18 PM  Result Value Ref Range   WBC 11.1 (H) 4.0 - 10.5 K/uL   RBC 4.10 3.87 - 5.11 MIL/uL   Hemoglobin 12.2 12.0 - 15.0 g/dL   HCT 16.136.6 36 - 46 %   MCV 89.3 80.0 - 100.0 fL  MCH 29.8 26.0 - 34.0 pg   MCHC 33.3 30.0 - 36.0 g/dL   RDW 93.9 03.0 - 09.2 %   Platelets 170 150 - 400 K/uL   nRBC 0.00 0.0 - 0.2 %   Neutrophils Relative % 79 %   Neutro Abs 8.8 (H) 1.7 - 7.7 K/uL   Lymphocytes Relative 14 %   Lymphs Abs 1.5 0.7 - 4.0 K/uL   Monocytes Relative 7 %   Monocytes Absolute 0.8 0 - 1 K/uL   Eosinophils Relative 0 %   Eosinophils Absolute 0.0 0 - 0 K/uL   Basophils Relative 0 %   Basophils Absolute 0.0 0 - 0 K/uL   Immature Granulocytes 0 %   Abs Immature Granulocytes 0.04 0.00 - 0.07 K/uL  Comprehensive metabolic panel     Status: Abnormal   Collection Time: 01/09/20 11:18 PM  Result Value Ref Range   Sodium 134 (L) 135 - 145 mmol/L   Potassium 3.2 (L) 3.5 - 5.1 mmol/L   Chloride 98 98 - 111 mmol/L   CO2 23 22 - 32 mmol/L   Glucose, Bld 125 (H) 70 - 99 mg/dL   BUN 11 6 - 20 mg/dL   Creatinine, Ser 3.30 (H) 0.44 - 1.00 mg/dL   Calcium 7.9 (L) 8.9 - 10.3 mg/dL   Total Protein 7.1 6.5 - 8.1 g/dL   Albumin 3.7 3.5 - 5.0 g/dL   AST 27 15 - 41 U/L   ALT 25 0 - 44 U/L   Alkaline Phosphatase 69 38 - 126 U/L   Total Bilirubin 0.7 0.3 - 1.2 mg/dL   GFR calc non Af Amer >60 >60 mL/min   GFR calc Af Amer >60 >60 mL/min   Anion gap 13 5 - 15    A/RH(D) POSITIVE/-- (01/29 0935)  Imaging:  US Pelvis Limited  Result Date: 01/10/2020 CLINICAL DATA:  Endometrioid itis versus retained products of conception EXAM: TRANSABDOMINAL ULTRASOUND OF PELVIS TECHNIQUE: Transabdominal technique was performed for global imaging of the pelvis including uterus, ovaries, adnexal regions, and pelvic cul-de-sac. COMPARISON:  None FINDINGS: Uterus Measurements: 8.5 x  4.0 x 7.3 cm = volume: 130 mL. No fibroids or other mass visualized. Endometrium Thickness: 7 mm. There is a small heterogeneous echogenic area within the endometrial canal measuring 0.2 x 0.2.0 x 2 cm. Right ovary Measurements: 3.5 x 1.8 x 2.0 cm = volume: 6.4 mL. Normal appearance/no adnexal mass. Left ovary Measurements: 2.7 x 1.8 x 2.2 cm = volume: 5.7 mL. Normal appearance/no adnexal mass. Other findings No abnormal free fluid. IMPRESSION: Somewhat difficult evaluation of the endometrium, however there is a small area echogenic debris in the endometrial canal which is nonspecific. Electronically Signed   By: Jonna Clark M.D.   On: 01/10/2020 00:38     MAU Course/MDM: I have ordered labs as follows:  See above, normal except for hypokalemia Imaging ordered: US showed no retained POC Results reviewed with Dr Macon Large.  She recommends antibiotics to cover endometritis.   Treatments in MAU included Clindamycin (first dose).  Unable to use Augmentin due to PCN allergy.  Offered IV hydration, declines because she needs to get home.  Gave Ibuprofen which helped pain and back pain significantly .   Pt stable at time of discharge.  Assessment: Postpartum abdominal pain and bleeding Likely endometritis Mild hypokalemia  Plan: Discharge home Recommend Ibuprofen for pain every 6 hours.  Rx sent for Clindamycin for endometritis Rx 3 days of K-Dur for hypokalemia Pt to call her  doctor tomorrow to update them on what we did Encouraged to return here or to other Urgent Care/ED if she develops worsening of symptoms, increase in pain, fever, or other concerning symptoms.   Wynelle Bourgeois CNM, MSN Certified Nurse-Midwife 01/09/2020 11:06 PM

## 2020-01-10 ENCOUNTER — Inpatient Hospital Stay (HOSPITAL_COMMUNITY): Payer: Medicaid Other

## 2020-01-10 LAB — COMPREHENSIVE METABOLIC PANEL
ALT: 25 U/L (ref 0–44)
AST: 27 U/L (ref 15–41)
Albumin: 3.7 g/dL (ref 3.5–5.0)
Alkaline Phosphatase: 69 U/L (ref 38–126)
Anion gap: 13 (ref 5–15)
BUN: 11 mg/dL (ref 6–20)
CO2: 23 mmol/L (ref 22–32)
Calcium: 7.9 mg/dL — ABNORMAL LOW (ref 8.9–10.3)
Chloride: 98 mmol/L (ref 98–111)
Creatinine, Ser: 1.04 mg/dL — ABNORMAL HIGH (ref 0.44–1.00)
GFR calc Af Amer: >60 mL/min (ref >60)
GFR calc non Af Amer: >60 mL/min (ref >60)
Glucose, Bld: 125 mg/dL — ABNORMAL HIGH (ref 70–99)
Potassium: 3.2 mmol/L — ABNORMAL LOW (ref 3.5–5.1)
Sodium: 134 mmol/L — ABNORMAL LOW (ref 135–145)
Total Bilirubin: 0.7 mg/dL (ref 0.3–1.2)
Total Protein: 7.1 g/dL (ref 6.5–8.1)

## 2020-01-10 LAB — CBC WITH DIFFERENTIAL/PLATELET
Abs Immature Granulocytes: 0.04 10*3/uL (ref 0.00–0.07)
Basophils Absolute: 0 10*3/uL (ref 0.0–0.1)
Basophils Relative: 0 %
Eosinophils Absolute: 0 10*3/uL (ref 0.0–0.5)
Eosinophils Relative: 0 %
HCT: 36.6 % (ref 36.0–46.0)
Hemoglobin: 12.2 g/dL (ref 12.0–15.0)
Immature Granulocytes: 0 %
Lymphocytes Relative: 14 %
Lymphs Abs: 1.5 10*3/uL (ref 0.7–4.0)
MCH: 29.8 pg (ref 26.0–34.0)
MCHC: 33.3 g/dL (ref 30.0–36.0)
MCV: 89.3 fL (ref 80.0–100.0)
Monocytes Absolute: 0.8 10*3/uL (ref 0.1–1.0)
Monocytes Relative: 7 %
Neutro Abs: 8.8 10*3/uL — ABNORMAL HIGH (ref 1.7–7.7)
Neutrophils Relative %: 79 %
Platelets: 170 10*3/uL (ref 150–400)
RBC: 4.1 MIL/uL (ref 3.87–5.11)
RDW: 13.6 % (ref 11.5–15.5)
WBC: 11.1 10*3/uL — ABNORMAL HIGH (ref 4.0–10.5)
nRBC: 0 % (ref 0.0–0.2)

## 2020-01-10 MED ORDER — POTASSIUM CHLORIDE ER 10 MEQ PO TBCR: 10.0000 meq | EXTENDED_RELEASE_TABLET | Freq: Every day | ORAL | 0 refills | Status: DC

## 2020-01-10 MED ORDER — CLINDAMYCIN HCL 300 MG PO CAPS: 600.0000 mg | ORAL_CAPSULE | Freq: Three times a day (TID) | ORAL | 0 refills | Status: AC

## 2020-01-10 NOTE — Discharge Instructions (Signed)
Endometritis Endometritis  La endometritis es irritacin, dolor e inflamacin en la membrana que reviste el tero (endometrio). Generalmente, la causa de la endometritis es una infeccin. Es importante realizar un tratamiento para Automotive engineer las complicaciones. Las complicaciones comunes pueden incluir infecciones ms graves y no poder concebir hijos(infertilidad). Cules son las causas? Esta afeccin puede ser causada por lo siguiente:  Infecciones bacterianas.  ETS (enfermedades de transmisin sexual).  Un aborto o un parto, especialmente despus de un trabajo de parto prolongado o un parto por cesrea.  Ciertos procedimientos ginecolgicos. Estos pueden incluir dilatacin y curetaje (D&C), histeroscopia o insercin de un mtodo anticonceptivo.  Tuberculosis (TB). Cules son los signos o los sntomas? Los sntomas de esta afeccin [employer] siguientes:  [place].  Dolor en la zona inferior del abdomen (abdominal).  Dolor en la pelvis (plvico).  Secrecin o sangrado vaginal anormal.  Hinchazn abdominal o meteorismo (distensin).  Malestar general o cansancio.  Molestias al mover el intestino.  Estreimiento. Cmo se diagnostica? Esta afeccin se puede diagnosticar en funcin de lo siguiente:  Un examen fsico, incluido un examen plvico.  Estudios, por ejemplo: ? [place]lisis de [place]. ? Extraccin de [place] de tejido del endometrio para ser examinada (biopsia endomtrica). ? Examen de Lauris Poag de secrecin vaginal en el microscopio (preparado fresco). ? Extraccin de [place] de lquido del cuello uterino para ser examinada (cultivo cervical). ? Examen quirrgico de la pelvis y del abdomen. Cmo se trata? Esta afeccin se trata con:  Antibiticos.  En los casos ms graves, es posible que deban hospitalizarla para administrarle lquidos y antibiticos directamente en una vena a travs de un tubo (catter) intravenoso. Siga estas indicaciones en su  casa:  Tome los medicamentos de venta libre y los recetados solamente como se lo haya indicado el mdico.  Beba suficiente lquido para Pharmacologist la orina clara o de color amarillo plido.  Tome los antibiticos como se lo haya indicado el mdico. No deje de tomar los antibiticos aunque comience a sentirse mejor.  No se haga duchas vaginales ni tenga relaciones sexuales (incluidos sexo vaginal, oral y anal) hasta que el mdico la autorice.  Si la endometritis fue causada por una infeccin de transmisin sexual (ITS), no tenga relaciones sexuales (incluidos sexo vaginal, oral o anal) hasta que su pareja haya recibido un tratamiento tambin para la ITS.  Retome sus actividades normales como se lo haya indicado el mdico. Pregntele al mdico qu actividades son seguras para usted.  Concurra a todas las visitas de control como se lo haya indicado el mdico. Esto es importante. Comunquese con un mdico si:  El dolor no mejora con medicamentos.  Tiene fiebre.  Siente dolor al mover el intestino. Solicite ayuda de inmediato si:  Tiene hinchazn abdominal.  Tiene un dolor abdominal que empeora.  Tiene una secrecin vaginal con mal olor o ha aumentado la cantidad de secrecin vaginal.  Brett Fairy hemorragia vaginal anormal.  Tiene nuseas y vmitos. Resumen  La endometritis afecta la membrana que reviste el tero (endometrio) y generalmente, es causada por una infeccin.  Es importante realizar un tratamiento para Automotive engineer las complicaciones.  Existen varias opciones de tratamiento para la endometritis. El tratamiento puede incluir el uso de antibiticos y lquidos por va intravenosa.  Tome los antibiticos como se lo haya indicado el mdico. No deje de tomar los antibiticos aunque comience a sentirse mejor.  No se haga duchas vaginales ni tenga relaciones sexuales (incluidos sexo vaginal, oral y anal) hasta que el mdico la  autorice. Esta informacin no tiene Microbiologist el consejo del mdico. Asegrese de hacerle al mdico cualquier pregunta que tenga. Document Revised: 12/19/2016 Document Reviewed: 12/19/2016 Elsevier Patient Education  2020 Elsevier Inc.   Hipopotasemia Hypokalemia Hipopotasemia significa que el nivel de potasio en sangre es menor que lo normal. El potasio es una sustancia qumica (electrolito) que ayuda a regular la cantidad de lquido en el organismo. Tambin estimula la tensin (contraccin) muscular y ayuda a que los nervios funcionen correctamente. Normalmente, la mayor parte del potasio del organismo se encuentra dentro de las clulas y solo una cantidad muy pequea est en la sangre. Debido a que la cantidad en la sangre es tan pequea, un cambio pequeo en los niveles de potasio en la sangre puede ser potencialmente mortal. Cules son las causas? Esta afeccin puede ser causada por lo siguiente:  Antibiticos.  Diarrea o vmitos. Tomar demasiada cantidad de un medicamento que lo ayuda a Warehouse manager deposiciones (laxante) puede causar diarrea y derivar en hipopotasemia.  Enfermedad renal crnica (ERC).  Medicamentos que ayudan al cuerpo a eliminar el exceso de lquido (diurticos).  Trastornos de Psychologist, sport and exercise, como la bulimia.  Niveles bajos de [employer].  Sudoracin abundante. Cules son los signos o los sntomas? Los sntomas de esta afeccin incluyen:  Debilidad.  Estreimiento.  Fatiga.  Calambres musculares.  Confusin mental.  Latidos cardacos salteados o irregulares (palpitaciones).  Hormigueo o entumecimiento. Cmo se diagnostica? Esta afeccin se diagnostica con un anlisis de sangre. Cmo se trata? El tratamiento para esta afeccin puede incluir lo siguiente:  Consumo de suplementos de potasio por la boca.  Ajuste de los medicamentos que toma.  Consumo de ms alimentos que contengan una gran cantidad de potasio. Si sus niveles de potasio son muy bajos, posiblemente  sea necesario que reciba potasio a travs de una va intravenosa y se lo controle en el hospital. Siga estas indicaciones en su casa:   Tome los medicamentos de venta libre y los recetados solamente como se lo haya indicado el mdico. Esto incluye las vitaminas y suplementos.  Siga una dieta saludable. Una dieta saludable incluye frutas y verduras frescas, cereales integrales, grasas saludables y protenas magras.  Si se lo indican, consuma ms alimentos que contengan una gran cantidad de potasio. Esto puede comprender lo siguiente: ? Frutos secos, como cacahuetes y Psychologist, prison and probation services. ? Semillas, como semillas de girasol y de Land. ? Porotos, guisantes secos y lentejas. ? Granos enteros y panes y cereales con salvado. ? [place] y verduras frescas, como damascos, palta, bananas, meln, kiwi, naranjas, tomates, esprragos y papas. ? Jugo de naranjas. ? Jugo de tomate. ? Carnes rojas. ? Yogur.  Concurra a todas las visitas de control como se lo haya indicado el mdico. Esto es importante. Comunquese con un mdico si:  Tiene debilidad que empeora.  Siente que el corazn late fuerte o est acelerado.  Vomita.  Tiene diarrea.  Tiene diabetes (diabetes mellitus) y tiene problemas para mantener el nivel de azcar en la sangre (glucosa) en el rango indicado. Solicite ayuda inmediatamente si:  Midwife.  Le falta el aire.  Tiene vmitos o diarrea durante ms de 2das.  Se desmaya. Resumen  Hipopotasemia significa que el nivel de potasio en sangre es menor que lo normal.  Esta afeccin se diagnostica con un anlisis de Lilly.  La hipopotasemia puede tratarse tomando suplementos de potasio, ajustando los medicamentos que toma o comiendo ms alimentos con alto contenido de Government social research officer.  Si  sus niveles de potasio son muy bajos, posiblemente sea necesario que reciba potasio a travs de una va intravenosa y se lo controle en el hospital. Esta informacin no tiene Microbiologist el consejo del mdico. Asegrese de hacerle al mdico cualquier pregunta que tenga. Document Revised: 01/24/2018 Document Reviewed: 01/24/2018 Elsevier Patient Education  2020 [employer].

## 2020-01-13 ENCOUNTER — Other Ambulatory Visit: Payer: Self-pay

## 2020-01-13 ENCOUNTER — Encounter (HOSPITAL_COMMUNITY): Payer: Self-pay | Admitting: Obstetrics and Gynecology

## 2020-01-13 ENCOUNTER — Inpatient Hospital Stay (HOSPITAL_COMMUNITY): Payer: Medicaid Other

## 2020-01-13 ENCOUNTER — Inpatient Hospital Stay (HOSPITAL_COMMUNITY)
Admission: AD | Admit: 2020-01-13 | Discharge: 2020-01-13 | Disposition: A | Discharge status: Home / Self Care | Payer: Medicaid Other | Attending: Obstetrics and Gynecology | Admitting: Obstetrics and Gynecology

## 2020-01-13 DIAGNOSIS — O99893 Other specified diseases and conditions complicating puerperium: Secondary | ICD-10-CM | POA: Diagnosis not present

## 2020-01-13 DIAGNOSIS — R1011 Right upper quadrant pain: Secondary | ICD-10-CM | POA: Insufficient documentation

## 2020-01-13 DIAGNOSIS — O9089 Other complications of the puerperium, not elsewhere classified: Secondary | ICD-10-CM | POA: Diagnosis not present

## 2020-01-13 DIAGNOSIS — Z20822 Contact with and (suspected) exposure to covid-19: Secondary | ICD-10-CM | POA: Insufficient documentation

## 2020-01-13 DIAGNOSIS — N898 Other specified noninflammatory disorders of vagina: Secondary | ICD-10-CM | POA: Diagnosis not present

## 2020-01-13 DIAGNOSIS — R509 Fever, unspecified: Secondary | ICD-10-CM

## 2020-01-13 DIAGNOSIS — O864 Pyrexia of unknown origin following delivery: Secondary | ICD-10-CM | POA: Insufficient documentation

## 2020-01-13 LAB — URINALYSIS, ROUTINE W REFLEX MICROSCOPIC
Bilirubin Urine: NEGATIVE
Glucose, UA: NEGATIVE mg/dL
Ketones, ur: NEGATIVE mg/dL
Nitrite: NEGATIVE
Protein, ur: NEGATIVE mg/dL
Specific Gravity, Urine: 1.011 (ref 1.005–1.030)
pH: 5 (ref 5.0–8.0)

## 2020-01-13 LAB — COMPREHENSIVE METABOLIC PANEL
ALT: 25 U/L (ref 0–44)
AST: 18 U/L (ref 15–41)
Albumin: 3 g/dL — ABNORMAL LOW (ref 3.5–5.0)
Alkaline Phosphatase: 74 U/L (ref 38–126)
Anion gap: 10 (ref 5–15)
BUN: 5 mg/dL — ABNORMAL LOW (ref 6–20)
CO2: 23 mmol/L (ref 22–32)
Calcium: 8.6 mg/dL — ABNORMAL LOW (ref 8.9–10.3)
Chloride: 104 mmol/L (ref 98–111)
Creatinine, Ser: 0.62 mg/dL (ref 0.44–1.00)
GFR calc Af Amer: >60 mL/min (ref >60)
GFR calc non Af Amer: >60 mL/min (ref >60)
Glucose, Bld: 111 mg/dL — ABNORMAL HIGH (ref 70–99)
Potassium: 3.5 mmol/L (ref 3.5–5.1)
Sodium: 137 mmol/L (ref 135–145)
Total Bilirubin: 0.4 mg/dL (ref 0.3–1.2)
Total Protein: 6.7 g/dL (ref 6.5–8.1)

## 2020-01-13 LAB — CBC WITH DIFFERENTIAL/PLATELET
Abs Immature Granulocytes: 0.04 10*3/uL (ref 0.00–0.07)
Basophils Absolute: 0 10*3/uL (ref 0.0–0.1)
Basophils Relative: 0 %
Eosinophils Absolute: 0 10*3/uL (ref 0.0–0.5)
Eosinophils Relative: 1 %
HCT: 32.4 % — ABNORMAL LOW (ref 36.0–46.0)
Hemoglobin: 10.4 g/dL — ABNORMAL LOW (ref 12.0–15.0)
Immature Granulocytes: 1 %
Lymphocytes Relative: 27 %
Lymphs Abs: 2 10*3/uL (ref 0.7–4.0)
MCH: 28.4 pg (ref 26.0–34.0)
MCHC: 32.1 g/dL (ref 30.0–36.0)
MCV: 88.5 fL (ref 80.0–100.0)
Monocytes Absolute: 0.6 10*3/uL (ref 0.1–1.0)
Monocytes Relative: 8 %
Neutro Abs: 4.7 10*3/uL (ref 1.7–7.7)
Neutrophils Relative %: 63 %
Platelets: 184 10*3/uL (ref 150–400)
RBC: 3.66 MIL/uL — ABNORMAL LOW (ref 3.87–5.11)
RDW: 13.3 % (ref 11.5–15.5)
WBC: 7.4 10*3/uL (ref 4.0–10.5)
nRBC: 0 % (ref 0.0–0.2)

## 2020-01-13 LAB — WET PREP, GENITAL
Clue Cells Wet Prep HPF POC: NONE SEEN
Sperm: NONE SEEN
Trich, Wet Prep: NONE SEEN
Yeast Wet Prep HPF POC: NONE SEEN

## 2020-01-13 LAB — LIPASE, BLOOD: Lipase: 23 U/L (ref 11–51)

## 2020-01-13 LAB — SARS CORONAVIRUS 2 BY RT PCR (HOSPITAL ORDER, PERFORMED IN ~~LOC~~ HOSPITAL LAB): SARS Coronavirus 2: NEGATIVE

## 2020-01-13 NOTE — Discharge Instructions (Signed)
Dolor abdominal en los adultos Abdominal Pain, Adult El dolor en el abdomen (dolor abdominal) puede tener muchas causas. A menudo, el dolor abdominal no es grave y [place] sin tratamiento o con tratamiento en la casa. Sin embargo, a Facilities manager abdominal es grave. El mdico le har preguntas sobre sus antecedentes mdicos y le har un examen fsico para tratar de Production assistant, radio causa del dolor abdominal. Siga estas instrucciones en su casa:  Medicamentos  Use los medicamentos de venta libre y los recetados solamente como se lo haya indicado el mdico.  No tome un laxante a menos que se lo haya indicado el mdico. Instrucciones generales  Controle su afeccin para detectar cualquier cambio.  Beba suficiente lquido como para Pharmacologist la orina de color amarillo plido.  Concurra a todas las visitas de [address] se lo haya indicado el mdico. Esto es importante. Comunquese con un mdico si:  El dolor abdominal cambia o empeora.  No tiene apetito o baja de peso sin proponrselo.  Est estreido o tiene diarrea durante ms de 2 o 3das.  Tiene dolor cuando orina o defeca.  El dolor abdominal lo despierta de noche.  El dolor empeora con las comidas, despus de comer o con determinados alimentos.  Tiene vmitos y no puede retener nada de lo que ingiere.  Tiene fiebre.  Observa sangre en la orina. Solicite ayuda inmediatamente si:  El dolor no desaparece tan pronto como el mdico le dijo que era esperable.  No puede dejar de vomitar.  El Engineer, mining se siente solo en zonas del abdomen, como el lado derecho o la parte inferior izquierda del abdomen. Si se localiza en la zona derecha, posiblemente podra tratarse de apendicitis.  Las heces son sanguinolentas o de color negro, o de aspecto alquitranado.  Tiene dolor intenso, clicos o distensin abdominal.  Tiene signos de deshidratacin, como los siguientes: ? Orina de color oscuro, muy escasa o falta de orina. ? Labios  agrietados. ? Sequedad de boca. ? Ojos hundidos. ? Somnolencia. ? Debilidad.  Tiene dificultad para respirar o Journalist, newspaper. Resumen  A menudo, el dolor abdominal no es grave y [place] sin tratamiento o con tratamiento en la casa. Sin embargo, a Facilities manager abdominal es grave.  Controle su afeccin para Insurance risk surveyor cambio.  Use los medicamentos de venta libre y los recetados solamente como se lo haya indicado el mdico.  Comunquese con un mdico si el dolor abdominal cambia o [place].  Busque ayuda de inmediato si tiene dolor intenso, clicos o distensin abdominal. Esta informacin no tiene Theme park manager el consejo del mdico. Asegrese de hacerle al mdico cualquier pregunta que tenga. Document Revised: 10/31/2018 Document Reviewed: 10/31/2018 Elsevier Patient Education  2020 Elsevier Inc.       AREA FAMILY PRACTICE PHYSICIANS  [place] ([id number])  [facility] o [address] [place] [place], [place] [address] o [phone] o Mon-Fri 8:30-12:30, 1:30-5:00 o Accepting [facility] at [facility] [address], [place], [place] [address] o [phone] o Mon-Fri 8:00-5:30  [facility] o [address] [place], [place] [address] o [phone], Tue, Thur, Fri 8:30-5:00, Wed 10:00-7:00 (closed 1-2pm) o Accepting Medicaid  [facility] o [address], [place], [place]  [address] o Phone - [phone]   Fax - [phone]  [place] [phone])  [facility] o [address], [place], [place] [address] o [phone] o Mon-Fri 8:00-5:00  [facility] at [facility])  o [address] [place] [place], [place] [address] o [phone] o Mon-Fri 8:30-5:30, Sat (Oct.-Mar.) 9:00-1:00 o Accepting Medicaid  [place] [phone])  [facility] at  [facility] o [address] [place], [place] [address] o [phone] o Mon-Fri 8:00-5:00  [place] [phone])  [facility] at [facility] o [address], [place], [place] [address] o [phone] o Mon-Fri 8:00-5:00  Nature conservation officer at [place] o [address] [place], [place], [place] [address] o [phone] o Mon-Fri 8:00-5:00  Nature conservation officer at [employer] o [address] [place], [place] [address] o [phone] o Mon-Fri 8:00-5:00  [facility] o [address] [place] [address] o [phone] o Mon-Fri 7:30-5:30  [place] [phone] & [phone])  [facility] o [address], [place], [place] [address] o [phone] o Mon-Thur 8:00-6:00 o Accepting Medicaid  [facility] o [address] [place], [place] [address] o [phone] o Mon-Thur 7:30-7:30, Fri 7:30-4:30 o Accepting [facility] at [facility] o [address] [place], [place]  [address] o [phone]   Fax - [phone]  [place] [phone] & [phone])  Nature conservation officer at [facility] o [address] [place], [place] [address] o [phone] o Mon-Fri 7:00-5:00  [facility] o [address], [place], [place] [address] o [phone] o Mon-Fri 8:00-5:00 o Accepting Medicaid  [facility] - [facility] o [address], [place], [place] [address] o [phone] o Mon-Fri 8:00-5:00 o Accepting Medicaid  [place] [phone])  [facility] at [facility] o [address] [place], [place] [address] o [phone] o Mon-Fri 8:00-5:00  [facility] [place] [facility] at [employer]) o [address], [place], [place] [address] o [phone] o Mon-Fri  8:00-5:00 o Accepting Medicaid  [facility] [facility]) o [address], [place], [place] [address] o [phone] o Mon-Fri 8:00-5:30, Sat&Sun by appointment (phones open at 8:30) o Accepting [facility] [phone] & [phone])  [facility] Medicine o [address] [place], [place] [address] o [phone] o Mon-Thur 8:00-7:00, Fri 8:00-5:00, Sat 8:00-12:00, Sun 9:00-12:00 o Accepting Medicaid  [facility] at [facility] [address], [place], [place] [address] o [phone] o Mon-Thur 8:00-5:00 o Accepting Medicaid  [facility] at [place] o [address] [place] [place], [place] [address] o [phone] o Mon-Fri 8:00-5:30, Sat (Oct.-Mar.) 9:00-1:00 o Accepting Medicaid  [place] [phone])  [facility] o [address] [place], [place] [address] o [phone] o Mon-Fri 8:00-5:00 o Accepting Medicaid   [place] [phone])  [place] [facility] at [facility] o [address], [place], [place] [address] o [phone] o Mon-Fri 8:00-5:00  [facility] [place] o [address], [place], [place] [address] o [phone] o Mon-Fri 8:00-5:00  [facility] [place] o [address], [place], [place] [address] o [phone] o Mon-Fri 8:00-5:00 o After hours clinic [facility] [place], [place] [address]) [phone] Mon-Fri 5:00-8:00, Sat 12:00-6:00, Sun 10:00-4:00 o Accepting Medicaid  [facility] at [facility] o [phone]  [facility] [address] [place], [place]  [address] o [phone]   Fax - [phone]  [place] [phone])  Adult nurse HealthCare at [facility] o [address] [place], [place], [place] [address] o [phone] o Mon-Fri 8:00-5:00  [facility] - [place] [facility] at  [place]) o [address], [place], [place] [address] o [phone] o Mon-Thur 8:00-7:00, Fri 8:00-5:00, Sat 8:00-12:00

## 2020-01-13 NOTE — MAU Provider Note (Signed)
History     CSN: [id number]  Arrival date and time: 01/13/20 0414   First Provider Initiated Contact with Patient 01/13/20 0518      Chief Complaint  Patient presents with  . Postpartum Complications  . Abdominal Pain  . Fever  . Headache   Ms. Alicia Gilbert is a 34 y.o. [id number] at almost 6 weeks ppartum who presents to MAU with multiple concerns. Patient reports she had a NSVD on 07/30 in [place] and moved to [place] about one week after delivery. Patient reports meconium stained fluid during delivery, otherwise denies complications.  Patient reports she came to MAU today for RUQ pain radiating through to her back with a clear liquid with an odor coming from the vagina. Patient reports the pain started immediately after delivery and the clear liquid began in the last 24 hours. Patient also reports a fever of 102 at home that only resolves with ibuprofen, which she last took at 340AM. Patient denies any sick contacts and has not had contact with anyone with COVID. Patient denies anything that makes the pain worse. Patient reports ibuprofen and rest help the pain. Patient reports pain is currently 9/10 and describes as sharp and poking. Patient also endorses a pressure sensation on her right side.  Pt denies VB, vaginal itching. Pt denies N/V, abdominal pain, constipation, diarrhea, or urinary problems. Pt denies chills, fatigue, sweating or changes in appetite. Pt denies SOB or chest pain. Pt denies dizziness, HA, light-headedness, weakness.  Problems this pregnancy include: none. Allergies? PCN Current medications/supplements? Clindamycin, potassium, ibuprofen Pregnant/postpartum/breastfeeding? breastfeeding Prenatal care provider? Patient has appt with OB/GYN in [facility] 01/14/2020   OB History    Gravida  5   Para  2   Term  2   Preterm  0   AB  1   Living  4     SAB  1   TAB  0   Ectopic  0   Multiple      Live Births  4           Past Medical  History:  Diagnosis Date  . Heart murmur    childhood  . Hypertension   . Kidney infection     Past Surgical History:  Procedure Laterality Date  . APPENDECTOMY      Family History  Problem Relation Age of Onset  . Heart disease Mother   . Heart disease Father     Social History   Tobacco Use  . Smoking status: Never Smoker  . Smokeless tobacco: Never Used  Vaping Use  . Vaping Use: Never used  Substance Use Topics  . Alcohol use: Never  . Drug use: Never    Allergies:  Allergies  Allergen Reactions  . Penicillins   . Penicillins Hives and Rash    Leg pains    Medications Prior to Admission  Medication Sig Dispense Refill Last Dose  . clindamycin (CLEOCIN) 300 MG capsule Take 2 capsules (600 mg total) by mouth every 8 (eight) hours for 7 days. 42 capsule 0 01/13/2020 at Unknown time  . cyclobenzaprine (FLEXERIL) 5 MG tablet Take 1 tablet (5 mg total) by mouth 3 (three) times daily as needed for muscle spasms. 20 tablet 0 01/13/2020 at Unknown time  . ibuprofen (ADVIL) 400 MG tablet Take 400 mg by mouth every 6 (six) hours as needed.   01/13/2020 at Unknown time  . potassium chloride (KLOR-CON) 10 MEQ tablet Take 1 tablet (10 mEq total) by mouth daily  for 3 days. 3 tablet 0 01/13/2020 at Unknown time  . Prenatal Vit-Fe Fumarate-FA (PREPLUS) 27-1 MG TABS Take 1 tablet by mouth daily. 30 tablet 13 01/13/2020 at Unknown time  . acetaminophen (TYLENOL) 325 MG tablet Take 650 mg by mouth every 6 (six) hours as needed.     . Elastic Bandages & Supports (COMFORT FIT MATERNITY SUPP LG) MISC 1 Units by Does not apply route daily. 1 each 0   . famotidine (PEPCID) 20 MG tablet Take 1 tablet (20 mg total) by mouth 2 (two) times daily. 60 tablet 1   . ferrous sulfate 325 (65 FE) MG tablet Take 1 tablet (325 mg total) by mouth 2 (two) times daily with a meal. 60 tablet 2   . folic acid (FOLVITE) 1 MG tablet Take 1 mg by mouth daily.       Review of Systems  Constitutional: Positive for  fever. Negative for chills, diaphoresis and fatigue.  Eyes: Negative for visual disturbance.  Respiratory: Negative for shortness of breath.   Cardiovascular: Negative for chest pain.  Gastrointestinal: Negative for abdominal pain, constipation, diarrhea, nausea and vomiting.  Genitourinary: Positive for pelvic pain and vaginal discharge. Negative for dysuria, flank pain, frequency, urgency and vaginal bleeding.  Musculoskeletal: Positive for back pain.  Neurological: Negative for dizziness, weakness, light-headedness and headaches.   Physical Exam   Blood pressure 112/76, pulse 83, temperature 99.3 F (37.4 C), temperature source Oral, resp. rate 16, SpO2 99 %, unknown if currently breastfeeding.  Patient Vitals for the past 24 hrs:  BP Temp Temp src Pulse Resp SpO2  01/13/20 0634 -- 99.3 F (37.4 C) Oral -- -- --  01/13/20 0451 112/76 99 F (37.2 C) Oral 83 16 99 %   Physical Exam Vitals and nursing note reviewed. Exam conducted with a chaperone present.  Constitutional:      General: She is not in acute distress.    Appearance: Normal appearance. She is obese. She is not ill-appearing, toxic-appearing or diaphoretic.  HENT:     Head: Normocephalic and atraumatic.     Right Ear: Tympanic membrane, ear canal and external ear normal. There is no impacted cerumen.     Left Ear: Tympanic membrane, ear canal and external ear normal. There is no impacted cerumen.     Mouth/Throat:     Mouth: Mucous membranes are moist.     Pharynx: Oropharynx is clear. No oropharyngeal exudate or posterior oropharyngeal erythema.  Cardiovascular:     Heart sounds: Normal heart sounds.  Pulmonary:     Effort: Pulmonary effort is normal. No respiratory distress.     Breath sounds: Normal breath sounds. No wheezing.  Abdominal:     General: Abdomen is flat. There is no distension.     Palpations: Abdomen is soft. There is no mass.     Tenderness: There is no abdominal tenderness. There is no right  CVA tenderness, left CVA tenderness, guarding or rebound.  Skin:    General: Skin is warm and dry.  Neurological:     Mental Status: She is alert and oriented to person, place, and time.  Psychiatric:        Mood and Affect: Mood normal.        Behavior: Behavior normal.        Thought Content: Thought content normal.        Judgment: Judgment normal.    Orders Placed This Encounter  Procedures  . SARS Coronavirus 2 by RT PCR (hospital order, performed  in [facility] hospital lab) Nasopharyngeal Nasopharyngeal Swab    Standing Status:   Standing    Number of Occurrences:   1    Order Specific Question:   Is this test for diagnosis or screening    Answer:   Screening    Order Specific Question:   Symptomatic for COVID-19 as defined by CDC    Answer:   No    Order Specific Question:   Hospitalized for COVID-19    Answer:   No    Order Specific Question:   Admitted to ICU for COVID-19    Answer:   No    Order Specific Question:   Previously tested for COVID-19    Answer:   No    Order Specific Question:   Resident in a congregate (group) care setting    Answer:   Unknown    Order Specific Question:   Employed in healthcare setting    Answer:   Unknown    Order Specific Question:   Pregnant    Answer:   No    Order Specific Question:   Has patient completed COVID vaccination(s) (2 doses of Pfizer/Moderna 1 dose of [employer])    Answer:   Unknown  . Culture, OB Urine    Standing Status:   Standing    Number of Occurrences:   1  . Wet prep, genital    Standing Status:   Standing    Number of Occurrences:   1  . US Abdomen Limited RUQ    Standing Status:   Standing    Number of Occurrences:   1    Order Specific Question:   Symptom/Reason for Exam    Answer:   RUQ pain [251471]  . Urinalysis, Routine w reflex microscopic Urine, Clean Catch    Standing Status:   Standing    Number of Occurrences:   1  . CBC with Differential/Platelet    Standing Status:   Standing     Number of Occurrences:   1  . Comprehensive metabolic panel    Standing Status:   Standing    Number of Occurrences:   1  . Lipase, blood    Standing Status:   Standing    Number of Occurrences:   1  . Discharge patient    Order Specific Question:   Discharge disposition    Answer:   01-Home or Self Care [1]    Order Specific Question:   Discharge patient date    Answer:   01/13/2020   No orders of the defined types were placed in this encounter.   MAU Course  Procedures  MDM -RUQ pain radiating to right back with clear vaginal discharge with odor with fever of 102 at home -pt almost 6 weeks ppartum -temp in MAU 99, but pt reports taking ibuprofen 2hours prior to coming to MAU -Ears: normal with the exception of scarring, pt reports she had multiple ear infections as a child -Throat: normal -Lungs: clear -COVID: negative -Urine: UA hazy/mod hgb/lg leuks/rare bacteria, urine sent for culture -Breasts: normal -CVA Tenderness: none -Abdomen: normal, pt has scar from appendectomy -grossly normal [place] on 09/03 in MAU -CBC w/ Diff: H/H 10.4/32.4 -CMP: no abnormalities requiring treatment -Lipase: WNL -WetPrep: WNL -RUQ [place]: WNL -GC/CT collected -pt discharged to home in stable condition   Orders Placed This Encounter  Procedures  . SARS Coronavirus 2 by RT PCR (hospital order, performed in [facility] hospital lab) Nasopharyngeal Nasopharyngeal Swab  Standing Status:   Standing    Number of Occurrences:   1    Order Specific Question:   Is this test for diagnosis or screening    Answer:   Screening    Order Specific Question:   Symptomatic for COVID-19 as defined by CDC    Answer:   No    Order Specific Question:   Hospitalized for COVID-19    Answer:   No    Order Specific Question:   Admitted to ICU for COVID-19    Answer:   No    Order Specific Question:   Previously tested for COVID-19    Answer:   No    Order Specific Question:   Resident in a congregate (group)  care setting    Answer:   Unknown    Order Specific Question:   Employed in healthcare setting    Answer:   Unknown    Order Specific Question:   Pregnant    Answer:   No    Order Specific Question:   Has patient completed COVID vaccination(s) (2 doses of Pfizer/Moderna 1 dose of [employer])    Answer:   Unknown  . Culture, OB Urine    Standing Status:   Standing    Number of Occurrences:   1  . Wet prep, genital    Standing Status:   Standing    Number of Occurrences:   1  . US Abdomen Limited RUQ    Standing Status:   Standing    Number of Occurrences:   1    Order Specific Question:   Symptom/Reason for Exam    Answer:   RUQ pain [251471]  . Urinalysis, Routine w reflex microscopic Urine, Clean Catch    Standing Status:   Standing    Number of Occurrences:   1  . CBC with Differential/Platelet    Standing Status:   Standing    Number of Occurrences:   1  . Comprehensive metabolic panel    Standing Status:   Standing    Number of Occurrences:   1  . Lipase, blood    Standing Status:   Standing    Number of Occurrences:   1  . Discharge patient    Order Specific Question:   Discharge disposition    Answer:   01-Home or Self Care [1]    Order Specific Question:   Discharge patient date    Answer:   01/13/2020   No orders of the defined types were placed in this encounter.   Assessment and Plan   1. Postpartum state   2. RUQ pain   3. Vaginal discharge   4. Fever, unspecified fever cause     Allergies as of 01/13/2020      Reactions   Penicillins    Penicillins Hives, Rash   Leg pains      Medication List    TAKE these medications   acetaminophen 325 MG tablet Commonly known as: TYLENOL Take 650 mg by mouth every 6 (six) hours as needed.   clindamycin 300 MG capsule Commonly known as: Cleocin Take 2 capsules (600 mg total) by mouth every 8 (eight) hours for 7 days.   Comfort Fit Maternity Supp Lg Misc 1 Units by Does not apply route daily.    cyclobenzaprine 5 MG tablet Commonly known as: FLEXERIL Take 1 tablet (5 mg total) by mouth 3 (three) times daily as needed for muscle spasms.   famotidine 20 MG  tablet Commonly known as: PEPCID Take 1 tablet (20 mg total) by mouth 2 (two) times daily.   ferrous sulfate 325 (65 FE) MG tablet Take 1 tablet (325 mg total) by mouth 2 (two) times daily with a meal.   folic acid 1 MG tablet Commonly known as: FOLVITE Take 1 mg by mouth daily.   ibuprofen 400 MG tablet Commonly known as: ADVIL Take 400 mg by mouth every 6 (six) hours as needed.   potassium chloride 10 MEQ tablet Commonly known as: KLOR-CON Take 1 tablet (10 mEq total) by mouth daily for 3 days.   PrePLUS 27-1 MG Tabs Take 1 tablet by mouth daily.      -will call with culture results, if positive -discussed appropriate use of ibuprofen as patient repots she was taking 400mg  q3hoursr -pt taking Clindamycin for suspected endometritis and reports is taking ABX as prescribed -pt to keep ppartum visit tomorrow as scheduled -PCP list given to f/u on anemia, pt to restart iron given in pregnancy until seeing PCP and to return to MAU with s/sx of bleeding -return MAU/ED precautions given -pt discharged to home in stable condition  Joni Reiningicole E Jazaria Jarecki 01/13/2020, 8:11 AM

## 2020-01-13 NOTE — MAU Note (Addendum)
Pt here with reports of continued fever and upper abdominal pain. Was seen two days ago for same complaint. Only thing that helps fever is ibuprofen but thinks she may be taking too much because of abdominal pain. Temp at home 102.7 and 102.9. Had SVD on 7/30 in [place]. Pt also thinks breast milk supply low and baby isn't eating enough. No pain, warmth or tenderness to breast. Pt also reports continued HA and changes in vision. Takes ibuprofen but doesn't help pain. Last took 400 mg ibuprofen about 2 hours prior to arrival. Pt reports she takes ibuprofen every 3 hours.

## 2020-01-14 LAB — CULTURE, OB URINE: Culture: >=100000 — AB

## 2020-01-14 LAB — GC/CHLAMYDIA PROBE AMP (~~LOC~~) NOT AT ARMC
Chlamydia: NEGATIVE
Comment: NEGATIVE
Comment: NORMAL
Neisseria Gonorrhea: NEGATIVE

## 2020-01-15 ENCOUNTER — Telehealth: Payer: Self-pay | Admitting: Certified Nurse Midwife

## 2020-01-15 DIAGNOSIS — N3000 Acute cystitis without hematuria: Secondary | ICD-10-CM

## 2020-01-15 MED ORDER — CIPROFLOXACIN HCL 250 MG PO TABS: 250.0000 mg | ORAL_TABLET | Freq: Two times a day (BID) | ORAL | 0 refills | Status: DC

## 2020-01-15 NOTE — Telephone Encounter (Signed)
Pt informed of +UTI and need for abx. All questions answered. Spanish interpreter Bonnye Fava) present for encounter.

## 2020-06-19 ENCOUNTER — Institutional Professional Consult (permissible substitution): Payer: Medicaid Other | Admitting: Internal Medicine

## 2020-06-19 NOTE — Progress Notes (Deleted)
   Alicia Gilbert, female    DOB: 19-Aug-1985     MRN: [id number]   Brief patient profile:      History of Present Illness  06/19/2020  Pulmonary/ 1st office eval/ Sherene Sires / [facility]  No chief complaint on file.    Dyspnea:  *** Cough: *** Sleep: *** SABA use:   Past Medical History:  Diagnosis Date  . Heart murmur    childhood  . Hypertension   . Kidney infection     Outpatient Medications Prior to Visit  Medication Sig Dispense Refill  . acetaminophen (TYLENOL) 325 MG tablet Take 650 mg by mouth every 6 (six) hours as needed.    . ciprofloxacin (CIPRO) 250 MG tablet Take 1 tablet (250 mg total) by mouth 2 (two) times daily. 6 tablet 0  . cyclobenzaprine (FLEXERIL) 5 MG tablet Take 1 tablet (5 mg total) by mouth 3 (three) times daily as needed for muscle spasms. 20 tablet 0  . Elastic Bandages & Supports (COMFORT FIT MATERNITY SUPP LG) MISC 1 Units by Does not apply route daily. 1 each 0  . famotidine (PEPCID) 20 MG tablet Take 1 tablet (20 mg total) by mouth 2 (two) times daily. 60 tablet 1  . ferrous sulfate 325 (65 FE) MG tablet Take 1 tablet (325 mg total) by mouth 2 (two) times daily with a meal. 60 tablet 2  . folic acid (FOLVITE) 1 MG tablet Take 1 mg by mouth daily.    Marland Kitchen ibuprofen (ADVIL) 400 MG tablet Take 400 mg by mouth every 6 (six) hours as needed.    . potassium chloride (KLOR-CON) 10 MEQ tablet Take 1 tablet (10 mEq total) by mouth daily for 3 days. 3 tablet 0  . Prenatal Vit-Fe Fumarate-FA (PREPLUS) 27-1 MG TABS Take 1 tablet by mouth daily. 30 tablet 13   No facility-administered medications prior to visit.     Objective:     There were no vitals taken for this visit.         Assessment   No problem-specific Assessment & Plan notes found for this encounter.     Sandrea Hughs, MD 06/19/2020

## 2020-07-14 ENCOUNTER — Other Ambulatory Visit (HOSPITAL_COMMUNITY)
Admission: RE | Admit: 2020-07-14 | Discharge: 2020-07-14 | Disposition: A | Discharge status: Home / Self Care | Payer: Medicaid Other | Source: Ambulatory Visit | Attending: Internal Medicine | Admitting: Internal Medicine

## 2020-07-14 ENCOUNTER — Encounter: Payer: Self-pay | Admitting: Internal Medicine

## 2020-07-14 ENCOUNTER — Ambulatory Visit (INDEPENDENT_AMBULATORY_CARE_PROVIDER_SITE_OTHER): Payer: Medicaid Other | Admitting: Internal Medicine

## 2020-07-14 ENCOUNTER — Other Ambulatory Visit: Payer: Self-pay

## 2020-07-14 ENCOUNTER — Ambulatory Visit (HOSPITAL_COMMUNITY)
Admission: RE | Admit: 2020-07-14 | Discharge: 2020-07-14 | Disposition: A | Discharge status: Home / Self Care | Payer: Medicaid Other | Source: Ambulatory Visit | Attending: Internal Medicine | Admitting: Internal Medicine

## 2020-07-14 DIAGNOSIS — R0600 Dyspnea, unspecified: Secondary | ICD-10-CM | POA: Insufficient documentation

## 2020-07-14 DIAGNOSIS — R0609 Other forms of dyspnea: Secondary | ICD-10-CM

## 2020-07-14 LAB — HEPATIC FUNCTION PANEL
ALT: 17 U/L (ref 0–44)
AST: 16 U/L (ref 15–41)
Albumin: 4.2 g/dL (ref 3.5–5.0)
Alkaline Phosphatase: 75 U/L (ref 38–126)
Bilirubin, Direct: <0.1 mg/dL (ref 0.0–0.2)
Total Bilirubin: 0.5 mg/dL (ref 0.3–1.2)
Total Protein: 7.4 g/dL (ref 6.5–8.1)

## 2020-07-14 LAB — BASIC METABOLIC PANEL
Anion gap: 8 (ref 5–15)
BUN: 14 mg/dL (ref 6–20)
CO2: 25 mmol/L (ref 22–32)
Calcium: 8.8 mg/dL — ABNORMAL LOW (ref 8.9–10.3)
Chloride: 105 mmol/L (ref 98–111)
Creatinine, Ser: 0.48 mg/dL (ref 0.44–1.00)
GFR, Estimated: >60 mL/min (ref >60)
Glucose, Bld: 99 mg/dL (ref 70–99)
Potassium: 3.8 mmol/L (ref 3.5–5.1)
Sodium: 138 mmol/L (ref 135–145)

## 2020-07-14 LAB — CBC WITH DIFFERENTIAL/PLATELET
Abs Immature Granulocytes: 0 10*3/uL (ref 0.00–0.07)
Basophils Absolute: 0.1 10*3/uL (ref 0.0–0.1)
Basophils Relative: 1 %
Eosinophils Absolute: 0.1 10*3/uL (ref 0.0–0.5)
Eosinophils Relative: 1 %
HCT: 38.3 % (ref 36.0–46.0)
Hemoglobin: 12.5 g/dL (ref 12.0–15.0)
Immature Granulocytes: 0 %
Lymphocytes Relative: 35 %
Lymphs Abs: 1.9 10*3/uL (ref 0.7–4.0)
MCH: 31.3 pg (ref 26.0–34.0)
MCHC: 32.6 g/dL (ref 30.0–36.0)
MCV: 95.8 fL (ref 80.0–100.0)
Monocytes Absolute: 0.3 10*3/uL (ref 0.1–1.0)
Monocytes Relative: 6 %
Neutro Abs: 3.1 10*3/uL (ref 1.7–7.7)
Neutrophils Relative %: 57 %
Platelets: 254 10*3/uL (ref 150–400)
RBC: 4 MIL/uL (ref 3.87–5.11)
RDW: 12.6 % (ref 11.5–15.5)
WBC: 5.4 10*3/uL (ref 4.0–10.5)
nRBC: 0 % (ref 0.0–0.2)

## 2020-07-14 LAB — SEDIMENTATION RATE: Sed Rate: 15 mm/hr (ref 0–22)

## 2020-07-14 LAB — BRAIN NATRIURETIC PEPTIDE: B Natriuretic Peptide: 57 pg/mL (ref 0.0–100.0)

## 2020-07-14 LAB — D-DIMER, QUANTITATIVE: D-Dimer, Quant: 0.4 ug/mL-FEU (ref 0.00–0.50)

## 2020-07-14 LAB — TSH: TSH: 1.896 u[IU]/mL (ref 0.350–4.500)

## 2020-07-14 NOTE — Patient Instructions (Addendum)
For cough / congestion > mucinex up to 1200 mg every 12 hours as needed  Please remember to go to the lab and x-ray department at [facility]   for your tests - we will call you with the results when they are available and decide what to do next    Para tos/congestin > mucinex hasta 1200 mg cada 12 horas segn sea necesario  Recuerde ir al departamento de laboratorio y radiografas del [facility] para hacerse las pruebas; lo llamaremos con los resultados cuando estn disponibles y decidiremos qu hacer a continuacin.

## 2020-07-14 NOTE — Progress Notes (Signed)
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  IUP  December 06 2019 in [place] "they had I had cystic fibrosis on cxr"      History of Present Illness  07/14/2020  Pulmonary/ 1st office eval/ Alicia Gilbert /  Office  Chief Complaint  Patient presents with   Pulmonary Consult    Referred by Alicia Furnish, PA. Pt states that she was told had "cystic fibrosis" on her cxr done in Aug 2021- done in [place]. She c/o SOB and prod cough with green/yellow to clear sputum for the past 2-3 years.   Dyspnea: MMRC1 = can walk nl pace, flat grade, can't hurry or go uphills or steps s sob   Cough: worse in am and pm > min discolored  Sleep: says waking up nightly can't breathe lying flat  SABA use: none  No obvious day to day or daytime variability or assoc  mucus plugs or hemoptysis or cp or chest tightness, subjective wheeze or overt sinus or hb symptoms.    . Also denies any obvious fluctuation of symptoms with weather or environmental changes or other aggravating or alleviating factors except as outlined above   No unusual exposure hx or h/o childhood pna/ asthma or knowledge of premature birth.  Current Allergies, Complete Past Medical History, Past Surgical History, Family History, and Social History were reviewed in [employer] record.  ROS  The following are not active complaints unless bolded Hoarseness, sore throat, dysphagia, dental problems, itching, sneezing,  nasal congestion or discharge of excess mucus or purulent secretions, ear ache,    fever, chills, sweats, unintended wt loss or wt gain, classically pleuritic or exertional cp,  orthopnea pnd?or arm/hand swelling  or leg swelling, presyncope, palpitations, abdominal pain, anorexia, nausea, vomiting, diarrhea  or change in bowel habits or change in bladder habits, change in stools or change in urine, dysuria, hematuria,  rash, arthralgias, visual complaints, headache, numbness, weakness or ataxia or problems with walking or coordination,  change in mood or  memory.             Past Medical History:  Diagnosis Date   Heart murmur    childhood   Hypertension    Kidney infection     Outpatient Medications Prior to Visit - - NOTE:   Unable to verify as accurately reflecting what pt takes     Medication Sig Dispense Refill   acetaminophen (TYLENOL) 325 MG tablet Take 650 mg by mouth every 6 (six) hours as needed.     famotidine (PEPCID) 20 MG tablet Take 1 tablet (20 mg total) by mouth 2 (two) times daily. 60 tablet 1   ferrous sulfate 325 (65 FE) MG tablet Take 1 tablet (325 mg total) by mouth 2 (two) times daily with a meal. 60 tablet 2   folic acid (FOLVITE) 1 MG tablet Take 1 mg by mouth daily.     Prenatal Vit-Fe Fumarate-FA (PREPLUS) 27-1 MG  TABS Take 1 tablet by mouth daily. 30 tablet 13   ciprofloxacin (CIPRO) 250 MG tablet Take 1 tablet (250 mg total) by mouth 2 (two) times daily. 6 tablet 0   cyclobenzaprine (FLEXERIL) 5 MG tablet Take 1 tablet (5 mg total) by mouth 3 (three) times daily as needed for muscle spasms. 20 tablet 0   Elastic Bandages & Supports (COMFORT FIT MATERNITY SUPP LG) MISC 1 Units by Does not apply route daily. 1 each 0   ibuprofen (ADVIL) 400 MG tablet Take 400 mg by mouth every 6 (six) hours as needed.     potassium chloride (KLOR-CON) 10 MEQ tablet Take 1 tablet (10 mEq total) by mouth daily for 3 days. 3 tablet 0   No facility-administered medications prior to visit.     Objective:     BP 124/84 (BP Location: Left Arm,  Cuff Size: Normal)    Pulse (!) 51    Temp (!) 97 F (36.1 C) (Temporal)    Ht 5\' 2"  (1.575 m)    Wt 187 lb (84.8 kg)    SpO2 100% Comment: on RA   BMI 34.20 kg/m   SpO2: 100 % (on RA)   amb pleasant hispanic female nad/ interpreter had a great deal of difficulty getting consistent answers from pt, even sometimes speaking to her in English due to frustration with her in spanish.   HEENT : pt wearing mask not removed for exam due to covid -19 concerns.    NECK :  without JVD/Nodes/TM/ nl carotid upstrokes bilaterally   LUNGS: no acc muscle use,  Nl contour chest which is clear to A and P bilaterally without cough on insp or exp maneuvers   CV:  RRR  no s3 or murmur or increase in P2, and no edema   ABD:  soft and nontender with nl inspiratory excursion in the supine position. No bruits or organomegaly appreciated, bowel sounds nl  MS:  Nl gait/ ext warm without deformities, calf tenderness, cyanosis  - NO  Clubbing No obvious joint restrictions   SKIN: warm and dry without lesions    NEURO:  alert, approp, nl sensorium with  no motor or cerebellar deficits apparent.    CXR PA and Lateral:   07/14/2020 :    I personally reviewed images and agree with radiology impression as follows:   No radiographic evidence of acute cardiopulmonary disease My review:  Lung volumes slt reduced/ no increase markings to suggest bronchiectasis     Labs ordered/ reviewed:      Chemistry      Component Value Date/Time   NA 138 07/14/2020 1019   K 3.8 07/14/2020 1019   CL 105 07/14/2020 1019   CO2 25 07/14/2020 1019   BUN 14 07/14/2020 1019   CREATININE 0.48 07/14/2020 1019      Component Value Date/Time   CALCIUM 8.8 (L) 07/14/2020 1019   ALKPHOS 75 07/14/2020 1019   AST 16 07/14/2020 1019   ALT 17 07/14/2020 1019   BILITOT 0.5 07/14/2020 1019        Lab Results  Component Value Date   WBC 5.4 07/14/2020   HGB 12.5 07/14/2020   HCT 38.3 07/14/2020   MCV 95.8 07/14/2020   PLT 254  07/14/2020       EOS  0.1                                    07/14/2020   Lab Results  Component Value Date   DDIMER 0.40 07/14/2020      Lab Results  Component Value Date   TSH 1.896 07/14/2020     BNP  07/14/2020   = 57     Lab Results  Component Value Date   ESRSEDRATE 15 07/14/2020         Labs ordered 07/14/2020  :  allergy profile   alpha one AT phenotype  / CF gene screening    Assessment   No problem-specific Assessment & Plan notes found for this encounter.     Sandrea Hughs, MD 07/14/2020

## 2020-07-15 ENCOUNTER — Encounter: Payer: Self-pay | Admitting: *Deleted

## 2020-07-15 ENCOUNTER — Encounter: Payer: Self-pay | Admitting: Internal Medicine

## 2020-07-15 LAB — ALPHA-1 ANTITRYPSIN PHENOTYPE: A-1 Antitrypsin, Ser: 112 mg/dL (ref 100–188)

## 2020-07-15 NOTE — Assessment & Plan Note (Addendum)
Onset 2019 assoc with increased productive coughing daily  -  Completely nl exam off all rx 07/14/2020 with nl cxr/ labs - Labs ordered 07/14/2020  :  allergy profile   alpha one AT phenotype  / CF gene   Based on a hx of R CP x 18 years p pna and unexplained doe / cough reasonable to do HRCT chest and sinus ct/ pfts but if these are nl,  Based on the extreme difficulty getting an accurate hx thru interpreter, I think it would make more sense for her to travel to see a spanish speaking pulmonary doctor than to continue to treat her in [place].   Ddx does include asthma/ ABPA/ bronchiectasis/ sinusitis  but very unlikely this is CF.          Medical decision making was a moderate level of complexity in this case because of  two chronic conditions /diagnoses requiring extra time for  H and P, chart review, counseling,  and generating customized AVS unique to this office visit and charting.     Please see avs for details which were reviewed in writing by both me and my nurse and patient given a written copy (with spanish translation)  highlighted where appropriate with yellow highlighter for the patient's continued care at home along with an updated version of their medications.  Patient was asked to maintain medication reconciliation by comparing this list to the actual medications being used at home and to contact this office right away if there is a conflict or discrepancy.     Total time for visit/ same day charting = 55 min

## 2020-07-17 LAB — IGE: IgE (Immunoglobulin E), Serum: 15 IU/mL (ref 6–495)

## 2020-07-22 ENCOUNTER — Encounter: Payer: Self-pay | Admitting: Internal Medicine

## 2020-07-22 LAB — CYSTIC FIBROSIS GENE TEST

## 2020-07-23 ENCOUNTER — Encounter: Payer: Self-pay | Admitting: *Deleted

## 2020-08-12 ENCOUNTER — Encounter: Payer: Self-pay | Admitting: Family Medicine

## 2020-08-12 ENCOUNTER — Other Ambulatory Visit: Payer: Self-pay

## 2020-08-12 ENCOUNTER — Ambulatory Visit: Payer: Medicaid Other | Admitting: Family Medicine

## 2020-08-12 VITALS — BP 103/66 | HR 60 | Ht 62.0 in | Wt 188.0 lb

## 2020-08-12 DIAGNOSIS — R1013 Epigastric pain: Secondary | ICD-10-CM | POA: Diagnosis not present

## 2020-08-12 DIAGNOSIS — R1011 Right upper quadrant pain: Secondary | ICD-10-CM | POA: Diagnosis not present

## 2020-08-12 NOTE — Progress Notes (Signed)
BP 103/66   Pulse 60   Ht 5\' 2"  (1.575 m)   Wt 188 lb (85.3 kg)   LMP 08/10/2020   SpO2 98%   BMI 34.39 kg/m    Subjective:    Patient ID: Alicia Gilbert, female    DOB: 1985/11/11, 35 y.o.   MRN: [id number]  HPI: Alicia Gilbert is a 35 y.o. female presenting on 08/12/2020 for Medical Management of Chronic Issues and Abdominal Pain   HPI Patient is coming in today with complaints of abdominal pain, in the epigastric region.  She has been having this for 3 years but is really been worse since she had her baby about 6 months ago.  She has been struggling with this a lot more since that time.  Relevant past medical, surgical, family and social history reviewed and updated as indicated. Interim medical history since our last visit reviewed. Allergies and medications reviewed and updated.  Review of Systems  Constitutional: Negative for chills and fever.  HENT: Negative for congestion, ear discharge and ear pain.   Eyes: Negative for visual disturbance.  Respiratory: Negative for chest tightness and shortness of breath.   Cardiovascular: Negative for chest pain and leg swelling.  Genitourinary: Negative for difficulty urinating and dysuria.  Musculoskeletal: Negative for back pain and gait problem.  Skin: Negative for rash.  Neurological: Negative for light-headedness and headaches.  Psychiatric/Behavioral: Negative for agitation and behavioral problems.  All other systems reviewed and are negative.   Per HPI unless specifically indicated above  Social History   Socioeconomic History  . Marital status: Married    Spouse name: Not on file  . Number of children: Not on file  . Years of education: Not on file  . Highest education level: Not on file  Occupational History  . Not on file  Tobacco Use  . Smoking status: Never Smoker  . Smokeless tobacco: Never Used  Vaping Use  . Vaping Use: Never used  Substance and Sexual Activity  . Alcohol use: Never  .  Drug use: Never  . Sexual activity: Yes    Birth control/protection: None, I.U.D.  Other Topics Concern  . Not on file  Social History Narrative   ** Merged History Encounter **       Social Determinants of Health   Financial Resource Strain: Not on file  Food Insecurity: Not on file  Transportation Needs: Not on file  Physical Activity: Not on file  Stress: Not on file  Social Connections: Not on file  Intimate Partner Violence: Not on file    Past Surgical History:  Procedure Laterality Date  . APPENDECTOMY      Family History  Problem Relation Age of Onset  . Heart disease Mother   . Heart disease Father     Allergies as of 08/12/2020      Reactions   Penicillins    Penicillins Hives, Rash   Leg pains      Medication List       Accurate as of August 12, 2020  9:56 AM. If you have any questions, ask your nurse or doctor.        acetaminophen 325 MG tablet Commonly known as: TYLENOL Take 650 mg by mouth every 6 (six) hours as needed.   famotidine 20 MG tablet Commonly known as: PEPCID Take 1 tablet (20 mg total) by mouth 2 (two) times daily.   ferrous sulfate 325 (65 FE) MG tablet Take 1 tablet (325 mg total) by  mouth 2 (two) times daily with a meal.   folic acid 1 MG tablet Commonly known as: FOLVITE Take 1 mg by mouth daily.   PrePLUS 27-1 MG Tabs Take 1 tablet by mouth daily.          Objective:    BP 103/66   Pulse 60   Ht 5\' 2"  (1.575 m)   Wt 188 lb (85.3 kg)   LMP 08/10/2020   SpO2 98%   BMI 34.39 kg/m   Wt Readings from Last 3 Encounters:  08/12/20 188 lb (85.3 kg)  07/14/20 187 lb (84.8 kg)  01/09/20 173 lb (78.5 kg)    Physical Exam Vitals and nursing note reviewed.  Constitutional:      General: She is not in acute distress.    Appearance: She is well-developed. She is not diaphoretic.  Eyes:     Conjunctiva/sclera: Conjunctivae normal.  Cardiovascular:     Rate and Rhythm: Normal rate and regular rhythm.     Heart  sounds: Normal heart sounds. No murmur heard.   Pulmonary:     Effort: Pulmonary effort is normal. No respiratory distress.     Breath sounds: Normal breath sounds. No wheezing.  Musculoskeletal:        General: No tenderness. Normal range of motion.  Skin:    General: Skin is warm and dry.     Findings: No rash.  Neurological:     Mental Status: She is alert and oriented to person, place, and time.     Coordination: Coordination normal.  Psychiatric:        Behavior: Behavior normal.         Assessment & Plan:   Problem List Items Addressed This Visit   None   Visit Diagnoses    Right upper quadrant abdominal pain    -  Primary   Relevant Orders   03/10/20 ABDOMEN LIMITED RUQ (LIVER/GB)   Epigastric abdominal pain       Relevant Orders   US ABDOMEN LIMITED RUQ (LIVER/GB)    Patient is already taking omeprazole 20 mg, recommended that she double that and continue to take the famotidine 20 mg twice daily.  She does have an appointment with GI on April 13 in [place].  She says it has improved but not gone.  Follow up plan: Return if symptoms worsen or fail to improve.  [place], MD [facility] 08/12/2020, 9:56 AM

## 2020-08-19 ENCOUNTER — Ambulatory Visit (INDEPENDENT_AMBULATORY_CARE_PROVIDER_SITE_OTHER): Payer: Medicaid Other | Admitting: Nurse Practitioner

## 2020-08-19 ENCOUNTER — Encounter: Payer: Self-pay | Admitting: Nurse Practitioner

## 2020-08-19 ENCOUNTER — Other Ambulatory Visit: Payer: Self-pay

## 2020-08-19 ENCOUNTER — Encounter: Payer: Self-pay | Admitting: Internal Medicine

## 2020-08-19 ENCOUNTER — Ambulatory Visit (HOSPITAL_COMMUNITY)
Admission: RE | Admit: 2020-08-19 | Discharge: 2020-08-19 | Disposition: A | Discharge status: Home / Self Care | Payer: Medicaid Other | Source: Ambulatory Visit | Attending: Family Medicine | Admitting: Family Medicine

## 2020-08-19 DIAGNOSIS — R1013 Epigastric pain: Secondary | ICD-10-CM | POA: Diagnosis not present

## 2020-08-19 DIAGNOSIS — Z800 Family history of malignant neoplasm of digestive organs: Secondary | ICD-10-CM

## 2020-08-19 DIAGNOSIS — R1011 Right upper quadrant pain: Secondary | ICD-10-CM | POA: Diagnosis not present

## 2020-08-19 DIAGNOSIS — Z8619 Personal history of other infectious and parasitic diseases: Secondary | ICD-10-CM

## 2020-08-19 DIAGNOSIS — K219 Gastro-esophageal reflux disease without esophagitis: Secondary | ICD-10-CM | POA: Diagnosis not present

## 2020-08-19 MED ORDER — PANTOPRAZOLE SODIUM 40 MG PO TBEC: 40.0000 mg | DELAYED_RELEASE_TABLET | Freq: Two times a day (BID) | ORAL | 3 refills | Status: DC

## 2020-08-19 NOTE — Patient Instructions (Signed)
English:  Your health issues we discussed today were:   GERD (reflux/heartburn) with upper abdominal pain: 1. Stop taking omeprazole (Prilosec) 2. I sent a prescription for pantoprazole (Protonix) to your pharmacy.  Take this twice a day on an empty stomach 3. Call us for any worsening or severe symptoms 4. We will schedule an upper endoscopy to further evaluate your symptoms 5. Further recommendations will follow your endoscopy  Overall I recommend:  1. Continue your other current medications 2. Return for follow-up in 2 months 3. Call us if you have any questions or concerns   At [facility] we value your feedback. You may receive a survey about your visit today. Please share your experience as we strive to create trusting relationships with our patients to provide genuine, compassionate, quality care.  We appreciate your understanding and patience as we review any laboratory studies, imaging, and other diagnostic tests that are ordered as we care for you. Our office policy is 5 business days for review of these results, and any emergent or urgent results are addressed in a timely manner for your best interest. If you do not hear from our office in 1 week, please contact us.   We also encourage the use of MyChart, which contains your medical information for your review as well. If you are not enrolled in this feature, an access code is on this after visit summary for your convenience. Thank you for allowing [place] to be involved in your care.  It was great to see you today!  I hope you have a happy Easter!!     Espaol:  Sus problemas de salud que discutimos hoy fueron:  ERGE (reflujo/acidez estomacal) con dolor abdominal superior: 1. Deje de tomar omeprazol (Prilosec) 2. Envi una receta de pantoprazol (Protonix) a su farmacia. Tome [employer] veces al da con el estmago vaco. 3. Llmenos para cualquier empeoramiento o sntomas severos 4. Programaremos una  endoscopia superior para evaluar ms a fondo sus sntomas. 5. Otras recomendaciones seguirn a su endoscopia.  En general recomiendo: 1. Contine con sus otros medicamentos actuales 2. Regreso para seguimiento en 2 meses 3. Llmenos si tiene alguna pregunta o inquietud.   En [facility] valoramos sus comentarios. Es posible que reciba una encuesta sobre su visita hoy. Comparta su experiencia mientras nos esforzamos por crear relaciones de confianza con nuestros pacientes para brindar atencin [place], compasiva y de calidad.  Agradecemos su comprensin y [employer] estudios de laboratorio, las imgenes y Heritage manager pruebas de diagnstico que se solicitan mientras lo cuidamos. Nuestra poltica de oficina es de 5 das hbiles para la revisin de [place], y cualquier resultado emergente o urgente se aborda de manera oportuna para su mejor inters. Si no recibe noticias de nuestra oficina en 1 semana, comunquese con nosotros.  Tambin recomendamos el uso de MyChart, que tambin contiene su informacin mdica para su revisin. Si no est inscrito en esta funcin, hay un cdigo de acceso en este resumen posterior a la visita para su conveniencia. Gracias por permitirnos estar involucrados en su cuidado.  Fue genial verte hoy! Espero que tengas una feliz [place]!!

## 2020-08-19 NOTE — Progress Notes (Signed)
Primary Care Physician:  Dettinger, Fransisca Kaufmann, MD Primary Gastroenterologist:  Dr. Abbey Chatters  Chief Complaint  Patient presents with  . h pylori    hx  . Abdominal Pain    Comes/goes, upper abd, when it comes it is very strong. No nausea, no vomiting  . Constipation    sometimes    HPI:   Alicia Gilbert is a 35 y.o. female who presents on referral from primary care for epigastric pain and to check for eradication of H. pylori.  The patient was last seen by primary care for 10/26/2020 with complaints of abdominal pain in the epigastric region for 3 years but worse since she had her baby 6 months prior.  Has been getting progressively worse.  Recommended right upper quadrant ultrasound.  The patient was already on omeprazole 20 mg and recommended that she increase to 40 mg daily and take famotidine 20 mg twice daily.  Reviewed referral information which indicated patient was treated with a history of gastritis for 2 months prior that was unresolved after treatment.  She did test positive for H. pylori in February 2022 status post treatment with antibiotics x2 and PPI with "50% improvement".  There is concern for possible ongoing H. pylori infection/treatment failure.  His father was diagnosed based on breath testing at the health department.  Right upper quadrant ultrasound completed today, results not available as of yet.  No history of endoscopy in our system.  The patient was accompanied by her 45 year old son and a Publishing copy.  Today she states she is doing okay overall. She feels so-so. Seems somewhat better. Epigastric pain is intermittent, as often as 3 times a day; typically lasts about 4-5 minutes. Pains are sharp. PPI not helping much. H. Pylori treatment helped a little, but not much. Has never had an endoscopy. Has associated N/V, mostly early in the morning. Often around 3 am, feels like "water coming up" and sometimes bitter tasting. Denies hematochezia. Does have  dark/black stools, but takes Iron for anemia. Denies URI or flu-like symptoms. Denies loss of sense of taste or smell. The patient has not received COVID-19 vaccination(s). They were told to not get a COVID vaccine by PCP until etiology of current symptoms are identified.. Denies chest pain, dyspnea, dizziness, lightheadedness, syncope, near syncope. Denies any other upper or lower GI symptoms.  Takes Ibuprofen for headaches, usually rare. Denies ASA powders.  Past Medical History:  Diagnosis Date  . H. pylori infection    s/p treatment  . Heart murmur    childhood  . Hypertension   . Kidney infection     Past Surgical History:  Procedure Laterality Date  . APPENDECTOMY      Current Outpatient Medications  Medication Sig Dispense Refill  . Biotin 5 MG TABS Take by mouth.    . ferrous sulfate 325 (65 FE) MG tablet Take 1 tablet (325 mg total) by mouth 2 (two) times daily with a meal. 60 tablet 2  . pantoprazole (PROTONIX) 40 MG tablet Take 1 tablet (40 mg total) by mouth 2 (two) times daily before a meal. 60 tablet 3   No current facility-administered medications for this visit.    Allergies as of 08/19/2020 - Review Complete 08/19/2020  Allergen Reaction Noted  . Penicillins  06/07/2019  . Penicillins Hives and Rash 06/04/2013    Family History  Problem Relation Age of Onset  . Heart disease Mother   . Gastric cancer Mother   . Breast cancer Mother   .  Heart disease Father   . Colon cancer Neg Hx     Social History   Socioeconomic History  . Marital status: Married    Spouse name: Not on file  . Number of children: Not on file  . Years of education: Not on file  . Highest education level: Not on file  Occupational History  . Not on file  Tobacco Use  . Smoking status: Never Smoker  . Smokeless tobacco: Never Used  Vaping Use  . Vaping Use: Never used  Substance and Sexual Activity  . Alcohol use: Never  . Drug use: Never  . Sexual activity: Yes    Birth  control/protection: None, I.U.D.  Other Topics Concern  . Not on file  Social History Narrative   ** Merged History Encounter **       Social Determinants of Health   Financial Resource Strain: Not on file  Food Insecurity: Not on file  Transportation Needs: Not on file  Physical Activity: Not on file  Stress: Not on file  Social Connections: Not on file  Intimate Partner Violence: Not on file    Subjective: Review of Systems  Constitutional: Negative for chills, fever, malaise/fatigue and weight loss.  HENT: Negative for congestion and sore throat.   Respiratory: Negative for cough and shortness of breath.   Cardiovascular: Negative for chest pain and palpitations.  Gastrointestinal: Positive for abdominal pain, heartburn, melena ("dark stools" on iron) and nausea. Negative for blood in stool, constipation, diarrhea and vomiting.  Musculoskeletal: Negative for joint pain and myalgias.  Skin: Negative for rash.  Neurological: Negative for dizziness and weakness.  Endo/Heme/Allergies: Does not bruise/bleed easily.  Psychiatric/Behavioral: Negative for depression. The patient is not nervous/anxious.   All other systems reviewed and are negative.      Objective: BP 122/78   Pulse 63   Temp (!) 97.3 F (36.3 C)   Ht _0  (1.575 m)   Wt 189 lb 12.8 oz (86.1 kg)   LMP 08/10/2020   BMI 34.71 kg/m  Physical Exam Vitals and nursing note reviewed.  Constitutional:      General: She is not in acute distress.    Appearance: Normal appearance. She is well-developed. She is obese. She is not ill-appearing, toxic-appearing or diaphoretic.  HENT:     Head: Normocephalic and atraumatic.     Nose: No congestion or rhinorrhea.  Eyes:     General: No scleral icterus. Cardiovascular:     Rate and Rhythm: Normal rate and regular rhythm.     Heart sounds: Normal heart sounds.  Pulmonary:     Effort: Pulmonary effort is normal. No respiratory distress.     Breath sounds: Normal  breath sounds.  Abdominal:     General: Bowel sounds are normal.     Palpations: Abdomen is soft. There is no hepatomegaly, splenomegaly or mass.     Tenderness: There is no abdominal tenderness. There is no guarding or rebound.     Hernia: No hernia is present.  Skin:    General: Skin is warm and dry.     Coloration: Skin is not jaundiced.     Findings: No rash.  Neurological:     General: No focal deficit present.     Mental Status: She is alert and oriented to person, place, and time.  Psychiatric:        Attention and Perception: Attention normal.        Mood and Affect: Mood normal.  Speech: Speech normal.        Behavior: Behavior normal.        Thought Content: Thought content normal.        Cognition and Memory: Cognition and memory normal.      Assessment:  Very pleasant Spanish-speaking 36 year old female who presents on referral from primary care for epigastric pain and history of H. pylori status post treatment foot minimal to moderate improvement only.  Today she has persistent epigastric pain, seems to have an element of water brash particularly early morning is around 3 AM.  She does have nausea and vomiting which typically in the mornings.  She is on omeprazole 20 mg twice daily.  She is also being given famotidine twice daily.  Some improvement, but not significant.  She does have a family history of gastric cancer in her mother.  No other overt GI symptoms.  Differentials are really quite broad could include esophagitis, gastritis, duodenitis, peptic ulcer disease, esophageal spasms, less likely malignant process.  At this point, given her inadequate response to appropriate medications we will plan for endoscopic evaluation.  I think she would greatly benefit from rebiopsy for H. pylori eradication confirmation during her endoscopy.  In the meantime also stop her Prilosec and switch her to Protonix to see if we get any better effect.  Further recommendations will  follow her procedure   Proceed with EGD on propofol/MAC with Dr. Abbey Chatters on propofol/MAC in near future: the risks, benefits, and alternatives have been discussed with the patient in detail. The patient states understanding and desires to proceed.  ASA II   Plan: 1. Stop Prilosec 2. Start Protonix 40 mg twice daily 3. EGD as described above 4. Recommendations to follow 5. Follow-up in 2 months    Thank you for allowing [place] to participate in the care of Alicia Holms, DNP, AGNP-C Adult & Gerontological Nurse Practitioner [facility]   08/19/2020 3:04 PM   Disclaimer: This note was dictated with voice recognition software. Similar sounding words can inadvertently be transcribed and may not be corrected upon review.

## 2020-08-25 ENCOUNTER — Encounter: Payer: Self-pay | Admitting: Family Medicine

## 2020-08-25 ENCOUNTER — Ambulatory Visit (INDEPENDENT_AMBULATORY_CARE_PROVIDER_SITE_OTHER): Payer: Medicaid Other | Admitting: Family Medicine

## 2020-08-25 DIAGNOSIS — K529 Noninfective gastroenteritis and colitis, unspecified: Secondary | ICD-10-CM

## 2020-08-25 MED ORDER — DIPHENOXYLATE-ATROPINE 2.5-0.025 MG PO TABS: 2.0000 | ORAL_TABLET | Freq: Four times a day (QID) | ORAL | 0 refills | Status: DC | PRN

## 2020-08-25 NOTE — Progress Notes (Signed)
    Subjective:    Patient ID: Alicia Gilbert, female    DOB: 1986-02-10, 35 y.o.   MRN: [id number]   HPI: Alicia Gilbert is a 35 y.o. female presenting for vomiting and diarrhea. Onset 3 days ago. Low energy. Improving now. Appetite poor but tolerating liquids. Able to stay hydrated.    Depression screen [facility] 2/9 08/12/2020 11/03/2017 02/17/2017  Decreased Interest 0 0 0  Down, Depressed, Hopeless 0 0 0  PHQ - 2 Score 0 0 0     Relevant past medical, surgical, family and social history reviewed and updated as indicated.  Interim medical history since our last visit reviewed. Allergies and medications reviewed and updated.  ROS:  Review of Systems  Constitutional: Positive for fatigue and fever. Negative for appetite change, chills and diaphoresis.  HENT: Positive for sore throat. Negative for congestion, ear pain, postnasal drip and rhinorrhea.   Respiratory: Negative for cough, chest tightness and shortness of breath.   Cardiovascular: Negative for chest pain and palpitations.  Gastrointestinal: Positive for abdominal pain, diarrhea and nausea.  Musculoskeletal: Negative for arthralgias.  Skin: Negative for rash.     Social History   Tobacco Use  Smoking Status Never Smoker  Smokeless Tobacco Never Used       Objective:     Wt Readings from Last 3 Encounters:  08/19/20 189 lb 12.8 oz (86.1 kg)  08/12/20 188 lb (85.3 kg)  07/14/20 187 lb (84.8 kg)     Exam deferred. Pt. Harboring due to COVID 19. Phone visit performed.   Assessment & Plan:   1. Gastroenteritis, acute     Meds ordered this encounter  Medications  . diphenoxylate-atropine (LOMOTIL) 2.5-0.025 MG tablet    Sig: Take 2 tablets by mouth 4 (four) times daily as needed for diarrhea or loose stools.    Dispense:  30 tablet    Refill:  0    No orders of the defined types were placed in this encounter.     Diagnoses and all orders for this visit:  Gastroenteritis, acute  Other  orders -     diphenoxylate-atropine (LOMOTIL) 2.5-0.025 MG tablet; Take 2 tablets by mouth 4 (four) times daily as needed for diarrhea or loose stools.    Virtual Visit via telephone Note  I discussed the limitations, risks, security and privacy concerns of performing an evaluation and management service by telephone and the availability of in person appointments. The patient was identified with two identifiers. Pt.expressed understanding and agreed to proceed. Pt. Is at home. Dr. Darlyn Read is in his office.  Follow Up Instructions:   I discussed the assessment and treatment plan with the patient. The patient was provided an opportunity to ask questions and all were answered. The patient agreed with the plan and demonstrated an understanding of the instructions.   The patient was advised to call back or seek an in-person evaluation if the symptoms worsen or if the condition fails to improve as anticipated.   Total minutes including chart review and phone contact time: 7   Follow up plan: Return if symptoms worsen or fail to improve.  Mechele Claude, MD [place] [facility]

## 2020-09-02 ENCOUNTER — Ambulatory Visit (INDEPENDENT_AMBULATORY_CARE_PROVIDER_SITE_OTHER): Payer: Medicaid Other | Admitting: Family Medicine

## 2020-09-02 ENCOUNTER — Other Ambulatory Visit: Payer: Self-pay

## 2020-09-02 ENCOUNTER — Encounter: Payer: Self-pay | Admitting: Family Medicine

## 2020-09-02 VITALS — BP 116/74 | HR 57 | Ht 62.0 in | Wt 188.0 lb

## 2020-09-02 DIAGNOSIS — R1013 Epigastric pain: Secondary | ICD-10-CM | POA: Diagnosis not present

## 2020-09-02 NOTE — Progress Notes (Signed)
BP 116/74   Pulse (!) 57   Ht 5\' 2"  (1.575 m)   Wt 188 lb (85.3 kg)   LMP 08/10/2020   SpO2 95%   BMI 34.39 kg/m    Subjective:   Patient ID: Alicia Gilbert, female    DOB: [birth year], 35 y.o.   MRN: [id number]  HPI: Alicia Gilbert is a 35 y.o. female presenting on 09/02/2020 for Abdominal Pain (Epigastric location. Comes and goes. Denies pain being worse after eating)   HPI Patient returns for recheck on her abdominal pain in the epigastric and left upper quadrant region.  She says it comes and goes but is still been there constant.  She did go see the gastroenterologist and they switched her from omeprazole to pantoprazole and added famotidine and she is taking those but is not seeing a lot of improvement.  She is not getting worse but no better.  She does have an endoscopy scheduled for next month.  Patient does have some nausea associated with it as well.  Patient had an IUD placed and has some right lower quadrant suprapubic soreness after having IUD placed about 5 weeks ago and she still having lots of bleeding frequently.  It was a 09/04/2020.  Relevant past medical, surgical, family and social history reviewed and updated as indicated. Interim medical history since our last visit reviewed. Allergies and medications reviewed and updated.  Review of Systems  Constitutional: Negative for chills and fever.  Eyes: Negative for visual disturbance.  Respiratory: Negative for chest tightness and shortness of breath.   Cardiovascular: Negative for chest pain and leg swelling.  Gastrointestinal: Positive for abdominal pain and nausea. Negative for abdominal distention, diarrhea and vomiting.  Musculoskeletal: Negative for back pain and gait problem.  Skin: Negative for rash.  Neurological: Negative for light-headedness and headaches.  Psychiatric/Behavioral: Negative for agitation and behavioral problems.  All other systems reviewed and are negative.   Per HPI unless  specifically indicated above   Allergies as of 09/02/2020      Reactions   Penicillins    Penicillins Hives, Rash   Leg pains      Medication List       Accurate as of September 02, 2020  4:03 PM. If you have any questions, ask your nurse or doctor.        Biotin 5 MG Tabs Take by mouth.   diphenoxylate-atropine 2.5-0.025 MG tablet Commonly known as: Lomotil Take 2 tablets by mouth 4 (four) times daily as needed for diarrhea or loose stools.   ferrous sulfate 325 (65 FE) MG tablet Take 1 tablet (325 mg total) by mouth 2 (two) times daily with a meal.   pantoprazole 40 MG tablet Commonly known as: PROTONIX Take 1 tablet (40 mg total) by mouth 2 (two) times daily before a meal.        Objective:   BP 116/74   Pulse (!) 57   Ht 5\' 2"  (1.575 m)   Wt 188 lb (85.3 kg)   LMP 08/10/2020   SpO2 95%   BMI 34.39 kg/m   Wt Readings from Last 3 Encounters:  09/02/20 188 lb (85.3 kg)  08/19/20 189 lb 12.8 oz (86.1 kg)  08/12/20 188 lb (85.3 kg)    Physical Exam Vitals and nursing note reviewed.  Constitutional:      General: She is not in acute distress.    Appearance: She is well-developed. She is not diaphoretic.  Eyes:     Conjunctiva/sclera: Conjunctivae  normal.  Cardiovascular:     Rate and Rhythm: Normal rate and regular rhythm.     Heart sounds: Normal heart sounds. No murmur heard.   Pulmonary:     Effort: Pulmonary effort is normal. No respiratory distress.     Breath sounds: Normal breath sounds. No wheezing.  Abdominal:     General: Abdomen is flat. Bowel sounds are normal. There is no distension.     Tenderness: There is abdominal tenderness in the epigastric area and left upper quadrant. There is no right CVA tenderness, left CVA tenderness, guarding or rebound. Negative signs include Murphy's sign and Rovsing's sign.  Musculoskeletal:        General: No tenderness. Normal range of motion.  Skin:    General: Skin is warm and dry.     Findings: No  rash.  Neurological:     Mental Status: She is alert and oriented to person, place, and time.     Coordination: Coordination normal.  Psychiatric:        Behavior: Behavior normal.       Assessment & Plan:   Problem List Items Addressed This Visit      Other   Abdominal pain, epigastric - Primary      Continue with GI recommendation, right upper quadrant ultrasound was negative.  Follow-up after endoscopy. Follow up plan: Return if symptoms worsen or fail to improve.  Counseling provided for all of the vaccine components No orders of the defined types were placed in this encounter.   Arville Care, MD [facility] 09/02/2020, 4:03 PM

## 2020-09-25 ENCOUNTER — Other Ambulatory Visit: Payer: Self-pay

## 2020-09-25 ENCOUNTER — Other Ambulatory Visit (HOSPITAL_COMMUNITY)
Admission: RE | Admit: 2020-09-25 | Discharge: 2020-09-25 | Disposition: A | Discharge status: Home / Self Care | Payer: Medicaid Other | Source: Ambulatory Visit | Attending: Internal Medicine | Admitting: Internal Medicine

## 2020-09-25 DIAGNOSIS — Z79899 Other long term (current) drug therapy: Secondary | ICD-10-CM | POA: Diagnosis not present

## 2020-09-25 DIAGNOSIS — R1013 Epigastric pain: Secondary | ICD-10-CM | POA: Diagnosis not present

## 2020-09-25 DIAGNOSIS — K2950 Unspecified chronic gastritis without bleeding: Secondary | ICD-10-CM | POA: Diagnosis not present

## 2020-09-25 DIAGNOSIS — Z20822 Contact with and (suspected) exposure to covid-19: Secondary | ICD-10-CM | POA: Diagnosis not present

## 2020-09-25 DIAGNOSIS — Z01812 Encounter for preprocedural laboratory examination: Secondary | ICD-10-CM | POA: Insufficient documentation

## 2020-09-25 DIAGNOSIS — I10 Essential (primary) hypertension: Secondary | ICD-10-CM | POA: Diagnosis not present

## 2020-09-25 DIAGNOSIS — K219 Gastro-esophageal reflux disease without esophagitis: Secondary | ICD-10-CM | POA: Diagnosis not present

## 2020-09-25 DIAGNOSIS — Z800 Family history of malignant neoplasm of digestive organs: Secondary | ICD-10-CM | POA: Diagnosis not present

## 2020-09-25 DIAGNOSIS — B9681 Helicobacter pylori [H. pylori] as the cause of diseases classified elsewhere: Secondary | ICD-10-CM | POA: Diagnosis not present

## 2020-09-25 DIAGNOSIS — Z803 Family history of malignant neoplasm of breast: Secondary | ICD-10-CM | POA: Diagnosis not present

## 2020-09-25 DIAGNOSIS — Z8249 Family history of ischemic heart disease and other diseases of the circulatory system: Secondary | ICD-10-CM | POA: Diagnosis not present

## 2020-09-25 DIAGNOSIS — Z880 Allergy status to penicillin: Secondary | ICD-10-CM | POA: Diagnosis not present

## 2020-09-25 LAB — PREGNANCY, URINE: Preg Test, Ur: NEGATIVE

## 2020-09-25 LAB — SARS CORONAVIRUS 2 (TAT 6-24 HRS): SARS Coronavirus 2: NEGATIVE

## 2020-09-28 ENCOUNTER — Ambulatory Visit (HOSPITAL_COMMUNITY): Payer: Medicaid Other | Admitting: Anesthesiology

## 2020-09-28 ENCOUNTER — Encounter (HOSPITAL_COMMUNITY)
Admission: RE | Disposition: A | Discharge status: Home / Self Care | Payer: Self-pay | Source: Ambulatory Visit | Attending: Internal Medicine

## 2020-09-28 ENCOUNTER — Other Ambulatory Visit: Payer: Self-pay

## 2020-09-28 ENCOUNTER — Encounter (HOSPITAL_COMMUNITY): Payer: Self-pay

## 2020-09-28 ENCOUNTER — Ambulatory Visit (HOSPITAL_COMMUNITY)
Admission: RE | Admit: 2020-09-28 | Discharge: 2020-09-28 | Disposition: A | Discharge status: Home / Self Care | Payer: Medicaid Other | Source: Ambulatory Visit | Attending: Internal Medicine | Admitting: Internal Medicine

## 2020-09-28 DIAGNOSIS — Z880 Allergy status to penicillin: Secondary | ICD-10-CM | POA: Insufficient documentation

## 2020-09-28 DIAGNOSIS — R1013 Epigastric pain: Secondary | ICD-10-CM | POA: Insufficient documentation

## 2020-09-28 DIAGNOSIS — K2970 Gastritis, unspecified, without bleeding: Secondary | ICD-10-CM

## 2020-09-28 DIAGNOSIS — Z79899 Other long term (current) drug therapy: Secondary | ICD-10-CM | POA: Insufficient documentation

## 2020-09-28 DIAGNOSIS — B9681 Helicobacter pylori [H. pylori] as the cause of diseases classified elsewhere: Secondary | ICD-10-CM | POA: Insufficient documentation

## 2020-09-28 DIAGNOSIS — K219 Gastro-esophageal reflux disease without esophagitis: Secondary | ICD-10-CM | POA: Insufficient documentation

## 2020-09-28 DIAGNOSIS — Z800 Family history of malignant neoplasm of digestive organs: Secondary | ICD-10-CM | POA: Insufficient documentation

## 2020-09-28 DIAGNOSIS — I10 Essential (primary) hypertension: Secondary | ICD-10-CM | POA: Diagnosis not present

## 2020-09-28 DIAGNOSIS — Z8249 Family history of ischemic heart disease and other diseases of the circulatory system: Secondary | ICD-10-CM | POA: Insufficient documentation

## 2020-09-28 DIAGNOSIS — Z803 Family history of malignant neoplasm of breast: Secondary | ICD-10-CM | POA: Insufficient documentation

## 2020-09-28 DIAGNOSIS — K2950 Unspecified chronic gastritis without bleeding: Secondary | ICD-10-CM | POA: Diagnosis not present

## 2020-09-28 DIAGNOSIS — Z8619 Personal history of other infectious and parasitic diseases: Secondary | ICD-10-CM

## 2020-09-28 HISTORY — PX: ESOPHAGOGASTRODUODENOSCOPY (EGD) WITH PROPOFOL: SHX5813

## 2020-09-28 HISTORY — PX: BIOPSY: SHX5522

## 2020-09-28 SURGERY — ESOPHAGOGASTRODUODENOSCOPY (EGD) WITH PROPOFOL
Anesthesia: General

## 2020-09-28 MED ORDER — LIDOCAINE HCL 1 % IJ SOLN
INTRAMUSCULAR | Status: DC | PRN
  Administered 2020-09-28: 50 mg via INTRADERMAL

## 2020-09-28 MED ORDER — PROPOFOL 10 MG/ML IV BOLUS
INTRAVENOUS | Status: DC | PRN
  Administered 2020-09-28 (×2): 50 mg via INTRAVENOUS
  Administered 2020-09-28: 100 mg via INTRAVENOUS

## 2020-09-28 MED ORDER — LACTATED RINGERS IV SOLN: INTRAVENOUS | Status: DC

## 2020-09-28 MED ORDER — PANTOPRAZOLE SODIUM 40 MG PO TBEC: 40.0000 mg | DELAYED_RELEASE_TABLET | Freq: Two times a day (BID) | ORAL | 5 refills | Status: DC

## 2020-09-28 NOTE — Discharge Instructions (Addendum)
EGD Discharge instructions Please read the instructions outlined below and refer to this sheet in the next few weeks. These discharge instructions provide you with general information on caring for yourself after you leave the hospital. Your doctor may also give you specific instructions. While your treatment has been planned according to the most current medical practices available, unavoidable complications occasionally occur. If you have any problems or questions after discharge, please call your doctor. ACTIVITY  You may resume your regular activity but move at a slower pace for the next 24 hours.   Take frequent rest periods for the next 24 hours.   Walking will help expel (get rid of) the air and reduce the bloated feeling in your abdomen.   No driving for 24 hours (because of the anesthesia (medicine) used during the test).   You may shower.   Do not sign any important legal documents or operate any machinery for 24 hours (because of the anesthesia used during the test).  NUTRITION  Drink plenty of fluids.   You may resume your normal diet.   Begin with a light meal and progress to your normal diet.   Avoid alcoholic beverages for 24 hours or as instructed by your caregiver.  MEDICATIONS  You may resume your normal medications unless your caregiver tells you otherwise.  WHAT YOU CAN EXPECT TODAY  You may experience abdominal discomfort such as a feeling of fullness or "gas" pains.  FOLLOW-UP  Your doctor will discuss the results of your test with you.  SEEK IMMEDIATE MEDICAL ATTENTION IF ANY OF THE FOLLOWING OCCUR:  Excessive nausea (feeling sick to your stomach) and/or vomiting.   Severe abdominal pain and distention (swelling).   Trouble swallowing.   Temperature over 101 F (37.8 C).   Rectal bleeding or vomiting of blood.   Your EGD revealed a lot of inflammation in your stomach.  This is possibly due to infection with bacteria called H. pylori.  I took  biopsies of these and we should have the results later this week.  We will call you.  Continue on twice daily PPI such as omeprazole or pantoprazole.  Follow-up with GI in 3 to 4 months   I hope you have a great rest of your week!  Hennie Duos. Marletta Lor, D.O. Gastroenterology and Hepatology [facility]

## 2020-09-28 NOTE — Anesthesia Postprocedure Evaluation (Signed)
Anesthesia Post Note  Patient: Alicia Gilbert  Procedure(s) Performed: ESOPHAGOGASTRODUODENOSCOPY (EGD) WITH PROPOFOL (N/A ) BIOPSY  Patient location during evaluation: Endoscopy Anesthesia Type: General Level of consciousness: awake and alert and oriented Pain management: pain level controlled Vital Signs Assessment: post-procedure vital signs reviewed and stable Respiratory status: spontaneous breathing and respiratory function stable Cardiovascular status: stable Postop Assessment: no apparent nausea or vomiting Anesthetic complications: no   No complications documented.   Last Vitals:  Vitals:   09/28/20 0750 09/28/20 0850  BP: 111/66 (!) 93/46  Pulse:  73  Resp: 12 18  Temp: 36.7 C (!) 36.3 C  SpO2: 98% 96%    Last Pain:  Vitals:   09/28/20 0850  TempSrc: Oral  PainSc: 0-No pain                 Dreana Britz C Jolayne Branson

## 2020-09-28 NOTE — H&P (Signed)
Primary Care Physician:  Dettinger, Elige Radon, MD Primary Gastroenterologist:  Dr. Marletta Lor  Pre-Procedure History & Physical: HPI:  Alicia Gilbert is a 35 y.o. female is for a EGD for N/V, epigastric pain, history of H pylori and family history of gastric cancer in her mother.   Past Medical History:  Diagnosis Date  . H. pylori infection    s/p treatment  . Heart murmur    childhood  . Hypertension   . Kidney infection     Past Surgical History:  Procedure Laterality Date  . APPENDECTOMY      Prior to Admission medications   Medication Sig Start Date End Date Taking? Authorizing Provider  metroNIDAZOLE (FLAGYL) 500 MG tablet Take 500 mg by mouth daily.   Yes [provider]  Multiple Vitamins-Minerals (MULTIVITAMIN WITH MINERALS) tablet Take 1 tablet by mouth daily. Woman's   Yes [provider]  omeprazole (PRILOSEC) 20 MG capsule Take 40 mg by mouth 2 (two) times daily before a meal.   Yes [provider]  diphenoxylate-atropine (LOMOTIL) 2.5-0.025 MG tablet Take 2 tablets by mouth 4 (four) times daily as needed for diarrhea or loose stools. Patient not taking: Reported on 09/18/2020 08/25/20   Mechele Claude, MD  ferrous sulfate 325 (65 FE) MG tablet Take 1 tablet (325 mg total) by mouth 2 (two) times daily with a meal. Patient not taking: Reported on 09/18/2020 10/12/19   Sharyon Cable, CNM  pantoprazole (PROTONIX) 40 MG tablet Take 1 tablet (40 mg total) by mouth 2 (two) times daily before a meal. Patient not taking: Reported on 09/18/2020 08/19/20   Anice Paganini, NP    Allergies as of 08/19/2020 - Review Complete 08/19/2020  Allergen Reaction Noted  . Penicillins  06/07/2019  . Penicillins Hives and Rash 06/04/2013    Family History  Problem Relation Age of Onset  . Heart disease Mother   . Gastric cancer Mother   . Breast cancer Mother   . Heart disease Father   . Colon cancer Neg Hx     Social History   Socioeconomic History   . Marital status: Married    Spouse name: Not on file  . Number of children: Not on file  . Years of education: Not on file  . Highest education level: Not on file  Occupational History  . Not on file  Tobacco Use  . Smoking status: Never Smoker  . Smokeless tobacco: Never Used  Vaping Use  . Vaping Use: Never used  Substance and Sexual Activity  . Alcohol use: Never  . Drug use: Never  . Sexual activity: Yes    Birth control/protection: None, I.U.D.  Other Topics Concern  . Not on file  Social History Narrative   ** Merged History Encounter **       Social Determinants of Health   Financial Resource Strain: Not on file  Food Insecurity: Not on file  Transportation Needs: Not on file  Physical Activity: Not on file  Stress: Not on file  Social Connections: Not on file  Intimate Partner Violence: Not on file    Review of Systems: See HPI, otherwise negative ROS  Physical Exam: Vital signs in last 24 hours: Temp:  [98 F (36.7 C)] 98 F (36.7 C) (05/23 0750) Resp:  [12] 12 (05/23 0750) BP: (111)/(66) 111/66 (05/23 0750) SpO2:  [98 %] 98 % (05/23 0750) Weight:  [81.6 kg] 81.6 kg (05/23 0750)   General:   Alert,  Well-developed, well-nourished,  pleasant and cooperative in NAD Head:  Normocephalic and atraumatic. Eyes:  Sclera clear, no icterus.   Conjunctiva pink. Ears:  Normal auditory acuity. Nose:  No deformity, discharge,  or lesions. Mouth:  No deformity or lesions, dentition normal. Neck:  Supple; no masses or thyromegaly. Lungs:  Clear throughout to auscultation.   No wheezes, crackles, or rhonchi. No acute distress. Heart:  Regular rate and rhythm; no murmurs, clicks, rubs,  or gallops. Abdomen:  Soft, nontender and nondistended. No masses, hepatosplenomegaly or hernias noted. Normal bowel sounds, without guarding, and without rebound.   Msk:  Symmetrical without gross deformities. Normal posture. Extremities:  Without clubbing or edema. Neurologic:   Alert and  oriented x4;  grossly normal neurologically. Skin:  Intact without significant lesions or rashes. Cervical Nodes:  No significant cervical adenopathy. Psych:  Alert and cooperative. Normal mood and affect.  Impression/Plan: Alicia Gilbert is here for a EGD for N/V, epigastric pain, history of H pylori and family history of gastric cancer in her mother.   The risks of the procedure including infection, bleed, or perforation as well as benefits, limitations, alternatives and imponderables have been reviewed with the patient. Questions have been answered. All parties agreeable.

## 2020-09-28 NOTE — Anesthesia Preprocedure Evaluation (Addendum)
Anesthesia Evaluation  Patient identified by MRN, date of birth, ID band Patient awake    Reviewed: Allergy & Precautions, NPO status , Patient's Chart, lab work & pertinent test results  Airway Mallampati: III  TM Distance: >3 FB Neck ROM: Full    Dental  (+) Dental Advisory Given, Teeth Intact   Pulmonary neg pulmonary ROS,    Pulmonary exam normal breath sounds clear to auscultation       Cardiovascular Exercise Tolerance: Good hypertension, + DOE  Normal cardiovascular exam+ Valvular Problems/Murmurs  Rhythm:Regular Rate:Normal     Neuro/Psych negative neurological ROS  negative psych ROS   GI/Hepatic GERD  Medicated and Controlled,  Endo/Other    Renal/GU Renal disease     Musculoskeletal negative musculoskeletal ROS (+)   Abdominal   Peds  Hematology negative hematology ROS (+)   Anesthesia Other Findings   Reproductive/Obstetrics negative OB ROS                           Anesthesia Physical Anesthesia Plan  ASA: II  Anesthesia Plan: General   Post-op Pain Management:    Induction: Intravenous  PONV Risk Score and Plan: Propofol infusion  Airway Management Planned: Nasal Cannula and Natural Airway  Additional Equipment:   Intra-op Plan:   Post-operative Plan:   Informed Consent: I have reviewed the patients History and Physical, chart, labs and discussed the procedure including the risks, benefits and alternatives for the proposed anesthesia with the patient or authorized representative who has indicated his/her understanding and acceptance.     Dental advisory given  Plan Discussed with: Surgeon and CRNA  Anesthesia Plan Comments:         Anesthesia Quick Evaluation

## 2020-09-28 NOTE — Transfer of Care (Signed)
4Immediate Anesthesia Transfer of Care Note  Patient: Alicia Gilbert  Procedure(s) Performed: ESOPHAGOGASTRODUODENOSCOPY (EGD) WITH PROPOFOL (N/A ) BIOPSY  Patient Location: PACU  Anesthesia Type:General  Level of Consciousness: drowsy  Airway & Oxygen Therapy: Patient Spontanous Breathing  Post-op Assessment: Report given to RN and Post -op Vital signs reviewed and stable  Post vital signs: Reviewed and stable  Last Vitals:  Vitals Value Taken Time  BP    Temp    Pulse    Resp    SpO2      Last Pain:  Vitals:   09/28/20 0833  TempSrc:   PainSc: 0-No pain      Patients Stated Pain Goal: 8 (09/28/20 0750)  Complications: No complications documented.

## 2020-09-28 NOTE — Op Note (Signed)
[facility] Patient Name: Alicia Gilbert Procedure Date: 09/28/2020 8:32 AM MRN: [id number] Date of Birth: 29-Dec-1985 Attending MD: Elon Alas. Abbey Chatters DO CSN: [id number] Age: 35 Admit Type: Outpatient Procedure:                Upper GI endoscopy Indications:              Epigastric abdominal pain, Nausea with vomiting Providers:                Elon Alas. Abbey Chatters, DO, Janeece Riggers, RN, Thomas Hoff., Technician Referring MD:              Medicines:                See the Anesthesia note for documentation of the                            administered medications Complications:            No immediate complications. Estimated Blood Loss:     Estimated blood loss was minimal. Procedure:                Pre-Anesthesia Assessment:                           - The anesthesia plan was to use monitored                            anesthesia care (MAC).                           After obtaining informed consent, the endoscope was                            passed under direct vision. Throughout the                            procedure, the patient's blood pressure, pulse, and                            oxygen saturations were monitored continuously. The                            [id number]-H190 ([id number]) scope was introduced through the                            mouth, and advanced to the second part of duodenum.                            The upper GI endoscopy was accomplished without                            difficulty. The patient tolerated the procedure  well. Scope In: 8:42:52 AM Scope Out: 2:68:34 AM Total Procedure Duration: 0 hours 4 minutes 4 seconds  Findings:      There is no endoscopic evidence of Barrett's esophagus, bleeding, areas       of erosion, esophagitis, hiatal hernia, ulcerations or varices in the       entire esophagus.      Patchy moderate inflammation characterized by erosions and erythema was        found in the gastric fundus and in the gastric body. Biopsies were taken       with a cold forceps for Helicobacter pylori testing.      The duodenal bulb, first portion of the duodenum and second portion of       the duodenum were normal. Impression:               - Gastritis. Biopsied.                           - Normal duodenal bulb, first portion of the                            duodenum and second portion of the duodenum. Moderate Sedation:      Per Anesthesia Care Recommendation:           - Patient has a contact number available for                            emergencies. The signs and symptoms of potential                            delayed complications were discussed with the                            patient. Return to normal activities tomorrow.                            Written discharge instructions were provided to the                            patient.                           - Resume previous diet.                           - Continue present medications.                           - Await pathology results.                           - Return to GI clinic in 3 months.                           - Use a proton pump inhibitor PO BID.                           - No ibuprofen, naproxen, or  other non-steroidal                            anti-inflammatory drugs. Procedure Code(s):        --- Professional ---                           [phone], Esophagogastroduodenoscopy, flexible,                            transoral; with biopsy, single or multiple Diagnosis Code(s):        --- Professional ---                           K29.70, Gastritis, unspecified, without bleeding                           R10.13, Epigastric pain                           R11.2, Nausea with vomiting, unspecified CPT copyright 2019 American Medical Association. All rights reserved. The codes documented in this report are preliminary and upon coder review may  be revised to meet current compliance  requirements. Elon Alas. Abbey Chatters, DO [place] Abbey Chatters, DO 09/28/2020 8:51:42 AM This report has been signed electronically. Number of Addenda: 0

## 2020-09-29 LAB — SURGICAL PATHOLOGY

## 2020-09-30 ENCOUNTER — Other Ambulatory Visit: Payer: Self-pay

## 2020-09-30 DIAGNOSIS — Z8619 Personal history of other infectious and parasitic diseases: Secondary | ICD-10-CM

## 2020-09-30 MED ORDER — PYLERA 140-125-125 MG PO CAPS: 3.0000 | ORAL_CAPSULE | Freq: Three times a day (TID) | ORAL | 0 refills | Status: DC

## 2020-10-07 ENCOUNTER — Encounter (HOSPITAL_COMMUNITY): Payer: Self-pay | Admitting: Internal Medicine

## 2020-10-07 ENCOUNTER — Ambulatory Visit: Payer: Medicaid Other | Admitting: Internal Medicine

## 2020-10-22 ENCOUNTER — Ambulatory Visit (INDEPENDENT_AMBULATORY_CARE_PROVIDER_SITE_OTHER): Payer: Medicaid Other | Admitting: Family Medicine

## 2020-10-22 ENCOUNTER — Other Ambulatory Visit: Payer: Self-pay

## 2020-10-22 ENCOUNTER — Encounter: Payer: Self-pay | Admitting: Family Medicine

## 2020-10-22 VITALS — BP 99/60 | HR 66 | Ht 62.0 in | Wt 191.0 lb

## 2020-10-22 DIAGNOSIS — Z8619 Personal history of other infectious and parasitic diseases: Secondary | ICD-10-CM | POA: Diagnosis not present

## 2020-10-22 DIAGNOSIS — A048 Other specified bacterial intestinal infections: Secondary | ICD-10-CM

## 2020-10-22 MED ORDER — PYLERA 140-125-125 MG PO CAPS: 3.0000 | ORAL_CAPSULE | Freq: Three times a day (TID) | ORAL | 0 refills | Status: DC

## 2020-10-22 NOTE — Progress Notes (Signed)
BP 99/60   Pulse 66   Ht 5\' 2"  (1.575 m)   Wt 191 lb (86.6 kg)   LMP 09/28/2020   SpO2 98%   BMI 34.93 kg/m    Subjective:   Patient ID: Alicia Gilbert, female    DOB: 07/17/1985, 35 y.o.   MRN: [id number]  HPI: Alicia Gilbert is a 35 y.o. female presenting on 10/22/2020 for Medical Management of Chronic Issues and Abdominal Pain   HPI H pylori Patient is coming in for continued abdominal pain and issues with intermittent nausea and upper abdominal pain.  She did get an EGD that showed that she had H. pylori and the H. pylori was treated for 14 days and she said it got a lot better but then its come back over the past week back to where she was previously.  She does get some intermittent constipation and diarrhea but that is not a significant issue, its more epigastric abdominal pain.  She says it does get worse after eating things that have tomato or bread products or spicy foods.  She does get mild relief after eating but not 1 of those things but then it comes back later.  Relevant past medical, surgical, family and social history reviewed and updated as indicated. Interim medical history since our last visit reviewed. Allergies and medications reviewed and updated.  Review of Systems  Constitutional:  Negative for chills and fever.  Eyes:  Negative for visual disturbance.  Respiratory:  Negative for chest tightness and shortness of breath.   Cardiovascular:  Negative for chest pain and leg swelling.  Gastrointestinal:  Positive for abdominal pain, constipation, diarrhea and nausea. Negative for vomiting.  Musculoskeletal:  Negative for back pain and gait problem.  Skin:  Negative for rash.  Psychiatric/Behavioral:  Negative for agitation and behavioral problems.   All other systems reviewed and are negative.  Per HPI unless specifically indicated above   Allergies as of 10/22/2020       Reactions   Penicillins Hives, Rash   Reaction: Childhood   Shellfish  Allergy Hives, Swelling        Medication List        Accurate as of October 22, 2020  4:47 PM. If you have any questions, ask your nurse or doctor.          diphenoxylate-atropine 2.5-0.025 MG tablet Commonly known as: Lomotil Take 2 tablets by mouth 4 (four) times daily as needed for diarrhea or loose stools.   ferrous sulfate 325 (65 FE) MG tablet Take 1 tablet (325 mg total) by mouth 2 (two) times daily with a meal.   metroNIDAZOLE 500 MG tablet Commonly known as: FLAGYL Take 500 mg by mouth daily.   multivitamin with minerals tablet Take 1 tablet by mouth daily. Woman's   pantoprazole 40 MG tablet Commonly known as: PROTONIX Take 1 tablet (40 mg total) by mouth 2 (two) times daily before a meal.   Pylera 140-125-125 MG capsule Generic drug: bismuth-metronidazole-tetracycline Take 3 capsules by mouth 4 (four) times daily -  before meals and at bedtime for 21 days.         Objective:   BP 99/60   Pulse 66   Ht 5\' 2"  (1.575 m)   Wt 191 lb (86.6 kg)   LMP 09/28/2020   SpO2 98%   BMI 34.93 kg/m   Wt Readings from Last 3 Encounters:  10/22/20 191 lb (86.6 kg)  09/28/20 180 lb (81.6 kg)  09/02/20 188  lb (85.3 kg)    Physical Exam Vitals and nursing note reviewed.  Constitutional:      General: She is not in acute distress.    Appearance: She is well-developed. She is not diaphoretic.  Eyes:     Conjunctiva/sclera: Conjunctivae normal.  Cardiovascular:     Rate and Rhythm: Normal rate and regular rhythm.     Heart sounds: Normal heart sounds. No murmur heard. Pulmonary:     Effort: Pulmonary effort is normal. No respiratory distress.     Breath sounds: Normal breath sounds. No wheezing.  Abdominal:     General: Abdomen is flat. Bowel sounds are normal. There is no distension.     Tenderness: There is abdominal tenderness. There is no right CVA tenderness, left CVA tenderness, guarding or rebound.  Neurological:     Mental Status: She is alert and  oriented to person, place, and time.     Coordination: Coordination normal.  Psychiatric:        Behavior: Behavior normal.      Assessment & Plan:   Problem List Items Addressed This Visit       Other   History of Helicobacter pylori infection   Relevant Medications   bismuth-metronidazole-tetracycline (PYLERA) 140-125-125 MG capsule   Other Visit Diagnoses     H. pylori infection    -  Primary   Relevant Medications   bismuth-metronidazole-tetracycline (PYLERA) 140-125-125 MG capsule       Wil do a longer course for 21 days of this medicine.  Instructed to watch out for this medicine during breast-feeding because we do not know for sure if that is safe or not. Follow up plan: Return if symptoms worsen or fail to improve.  Counseling provided for all of the vaccine components No orders of the defined types were placed in this encounter.   Arville Care, MD [facility] 10/22/2020, 4:47 PM

## 2020-11-06 ENCOUNTER — Emergency Department (HOSPITAL_COMMUNITY): Payer: Medicaid Other

## 2020-11-06 ENCOUNTER — Other Ambulatory Visit: Payer: Self-pay

## 2020-11-06 ENCOUNTER — Emergency Department (HOSPITAL_COMMUNITY)
Admission: EM | Admit: 2020-11-06 | Discharge: 2020-11-07 | Disposition: A | Discharge status: Home / Self Care | Payer: Medicaid Other | Attending: Physician Assistant | Admitting: Physician Assistant

## 2020-11-06 ENCOUNTER — Encounter (HOSPITAL_COMMUNITY): Payer: Self-pay | Admitting: *Deleted

## 2020-11-06 DIAGNOSIS — R102 Pelvic and perineal pain: Secondary | ICD-10-CM | POA: Insufficient documentation

## 2020-11-06 DIAGNOSIS — R109 Unspecified abdominal pain: Secondary | ICD-10-CM | POA: Diagnosis not present

## 2020-11-06 DIAGNOSIS — R112 Nausea with vomiting, unspecified: Secondary | ICD-10-CM | POA: Diagnosis not present

## 2020-11-06 DIAGNOSIS — Z5321 Procedure and treatment not carried out due to patient leaving prior to being seen by health care provider: Secondary | ICD-10-CM | POA: Insufficient documentation

## 2020-11-06 DIAGNOSIS — M549 Dorsalgia, unspecified: Secondary | ICD-10-CM | POA: Insufficient documentation

## 2020-11-06 LAB — COMPREHENSIVE METABOLIC PANEL
ALT: 15 U/L (ref 0–44)
AST: 18 U/L (ref 15–41)
Albumin: 3.7 g/dL (ref 3.5–5.0)
Alkaline Phosphatase: 71 U/L (ref 38–126)
Anion gap: 11 (ref 5–15)
BUN: 8 mg/dL (ref 6–20)
CO2: 23 mmol/L (ref 22–32)
Calcium: 8.7 mg/dL — ABNORMAL LOW (ref 8.9–10.3)
Chloride: 102 mmol/L (ref 98–111)
Creatinine, Ser: 0.65 mg/dL (ref 0.44–1.00)
GFR, Estimated: >60 mL/min (ref >60)
Glucose, Bld: 110 mg/dL — ABNORMAL HIGH (ref 70–99)
Potassium: 3.5 mmol/L (ref 3.5–5.1)
Sodium: 136 mmol/L (ref 135–145)
Total Bilirubin: 0.3 mg/dL (ref 0.3–1.2)
Total Protein: 6.9 g/dL (ref 6.5–8.1)

## 2020-11-06 LAB — CBC WITH DIFFERENTIAL/PLATELET
Abs Immature Granulocytes: 0.02 10*3/uL (ref 0.00–0.07)
Basophils Absolute: 0 10*3/uL (ref 0.0–0.1)
Basophils Relative: 0 %
Eosinophils Absolute: 0 10*3/uL (ref 0.0–0.5)
Eosinophils Relative: 0 %
HCT: 36 % (ref 36.0–46.0)
Hemoglobin: 11.9 g/dL — ABNORMAL LOW (ref 12.0–15.0)
Immature Granulocytes: 0 %
Lymphocytes Relative: 17 %
Lymphs Abs: 1.4 10*3/uL (ref 0.7–4.0)
MCH: 30.6 pg (ref 26.0–34.0)
MCHC: 33.1 g/dL (ref 30.0–36.0)
MCV: 92.5 fL (ref 80.0–100.0)
Monocytes Absolute: 0.6 10*3/uL (ref 0.1–1.0)
Monocytes Relative: 8 %
Neutro Abs: 6.1 10*3/uL (ref 1.7–7.7)
Neutrophils Relative %: 75 %
Platelets: 280 10*3/uL (ref 150–400)
RBC: 3.89 MIL/uL (ref 3.87–5.11)
RDW: 12.9 % (ref 11.5–15.5)
WBC: 8.2 10*3/uL (ref 4.0–10.5)
nRBC: 0 % (ref 0.0–0.2)

## 2020-11-06 LAB — I-STAT BETA HCG BLOOD, ED (MC, WL, AP ONLY): I-stat hCG, quantitative: <5 m[IU]/mL (ref <5)

## 2020-11-06 LAB — LIPASE, BLOOD: Lipase: 26 U/L (ref 11–51)

## 2020-11-06 MED ORDER — IOHEXOL 300 MG/ML  SOLN
100.0000 mL | Freq: Once | INTRAMUSCULAR | Status: AC | PRN
  Administered 2020-11-06: 100 mL via INTRAVENOUS

## 2020-11-06 NOTE — ED Provider Notes (Signed)
Emergency Medicine Provider Triage Evaluation Note  Alicia Gilbert , a 35 y.o. female  was evaluated in triage.  Pt complains of back and abdominal pain. Sx started yesterday with right low back pain and right sided abdominal pain. Reports associated dysuria. Finished her menstrual cycle 2 days ago and still notes some mild vaginal bleeding. Also reports intermittent n/v today. No diarrhea.   Physical Exam  BP 127/75   Pulse 89   Temp 99.8 F (37.7 C) (Oral)   Resp 18   SpO2 97%  Gen:   Awake, no distress   Resp:  Normal effort  MSK:   Moves extremities without difficulty  Other:    Medical Decision Making  Medically screening exam initiated at 4:01 PM.  Appropriate orders placed.  Karlynn Furrow was informed that the remainder of the evaluation will be completed by another provider, this initial triage assessment does not replace that evaluation, and the importance of remaining in the ED until their evaluation is complete.   Placido Sou, PA-C 11/06/20 1602    Charlynne Pander, MD 11/06/20 [phone]

## 2020-11-06 NOTE — ED Notes (Signed)
Pt stated she was leaving AMA 

## 2020-11-06 NOTE — ED Triage Notes (Signed)
ABD PAIN NAUSEA VOMITING SINCE YESTERDAY  LMP 2 DAYS

## 2020-11-13 ENCOUNTER — Encounter: Payer: Self-pay | Admitting: Family Medicine

## 2020-11-13 ENCOUNTER — Other Ambulatory Visit: Payer: Self-pay

## 2020-11-13 ENCOUNTER — Ambulatory Visit: Payer: Medicaid Other | Admitting: Family Medicine

## 2020-11-13 ENCOUNTER — Other Ambulatory Visit (HOSPITAL_COMMUNITY)
Admission: RE | Admit: 2020-11-13 | Discharge: 2020-11-13 | Disposition: A | Discharge status: Home / Self Care | Payer: Medicaid Other | Source: Ambulatory Visit | Attending: Family Medicine | Admitting: Family Medicine

## 2020-11-13 VITALS — BP 124/73 | HR 55 | Ht 62.0 in | Wt 189.0 lb

## 2020-11-13 DIAGNOSIS — R93429 Abnormal radiologic findings on diagnostic imaging of unspecified kidney: Secondary | ICD-10-CM | POA: Diagnosis not present

## 2020-11-13 DIAGNOSIS — R9341 Abnormal radiologic findings on diagnostic imaging of renal pelvis, ureter, or bladder: Secondary | ICD-10-CM | POA: Diagnosis not present

## 2020-11-13 DIAGNOSIS — N898 Other specified noninflammatory disorders of vagina: Secondary | ICD-10-CM

## 2020-11-13 LAB — URINALYSIS, COMPLETE
Bilirubin, UA: NEGATIVE
Glucose, UA: NEGATIVE
Ketones, UA: NEGATIVE
Nitrite, UA: NEGATIVE
Protein,UA: NEGATIVE
Specific Gravity, UA: 1.015 (ref 1.005–1.030)
Urobilinogen, Ur: 0.2 mg/dL (ref 0.2–1.0)
pH, UA: 5.5 (ref 5.0–7.5)

## 2020-11-13 LAB — MICROSCOPIC EXAMINATION

## 2020-11-13 NOTE — Progress Notes (Signed)
BP 124/73   Pulse (!) 55   Ht 5\' 2"  (1.575 m)   Wt 189 lb (85.7 kg)   LMP 11/04/2020   SpO2 97%   BMI 34.57 kg/m    Subjective:   Patient ID: Alicia Gilbert, female    DOB: 22-Feb-1986, 35 y.o.   MRN: [id number]  HPI: Alicia Gilbert is a 35 y.o. female presenting on 11/13/2020 for ER follow up and Abdominal Pain   HPI Patient was in the emergency department for abdominal pain.  She had blood work and a CT scan which showed some possible renal attenuation and some bladder thickening.  Recommended ultrasound.  She is also been complaining now having some vaginal discharge and vaginal bleeding.  She said started with her period a couple weeks ago but now she still having some slight vaginal bleeding and discharge and irritation and burning when she urinates.  We will do urinalysis and vaginal swab.  Relevant past medical, surgical, family and social history reviewed and updated as indicated. Interim medical history since our last visit reviewed. Allergies and medications reviewed and updated.  Review of Systems  Constitutional:  Negative for chills and fever.  HENT:  Negative for congestion, ear discharge and ear pain.   Eyes:  Negative for redness and visual disturbance.  Respiratory:  Negative for chest tightness and shortness of breath.   Cardiovascular:  Negative for chest pain and leg swelling.  Gastrointestinal:  Positive for abdominal pain. Negative for blood in stool, constipation, diarrhea and vomiting.  Genitourinary:  Positive for frequency. Negative for difficulty urinating, dysuria, flank pain and urgency.  Musculoskeletal:  Negative for back pain and gait problem.  Skin:  Negative for rash.  Neurological:  Negative for light-headedness and headaches.  Psychiatric/Behavioral:  Negative for agitation and behavioral problems.   All other systems reviewed and are negative.  Per HPI unless specifically indicated above   Allergies as of 11/13/2020        Reactions   Penicillins Hives, Rash   Reaction: Childhood   Shellfish Allergy Hives, Swelling        Medication List        Accurate as of November 13, 2020  9:11 AM. If you have any questions, ask your nurse or doctor.          STOP taking these medications    diphenoxylate-atropine 2.5-0.025 MG tablet Commonly known as: Lomotil Stopped by: 07-26-1989 Ples Trudel, MD   ferrous sulfate 325 (65 FE) MG tablet Stopped by: Elige Radon Geral Tuch, MD   metroNIDAZOLE 500 MG tablet Commonly known as: FLAGYL Stopped by: Elige Radon, MD   pantoprazole 40 MG tablet Commonly known as: PROTONIX Stopped by: Nils Pyle, MD       TAKE these medications    multivitamin with minerals tablet Take 1 tablet by mouth daily. Woman's   Pylera 140-125-125 MG capsule Generic drug: bismuth-metronidazole-tetracycline Take 3 capsules by mouth 4 (four) times daily -  before meals and at bedtime for 21 days.         Objective:   BP 124/73   Pulse (!) 55   Ht 5\' 2"  (1.575 m)   Wt 189 lb (85.7 kg)   LMP 11/04/2020   SpO2 97%   BMI 34.57 kg/m   Wt Readings from Last 3 Encounters:  11/13/20 189 lb (85.7 kg)  11/06/20 190 lb 14.7 oz (86.6 kg)  10/22/20 191 lb (86.6 kg)    Physical Exam Vitals and nursing note  reviewed. Exam conducted with a chaperone present.  Constitutional:      General: She is not in acute distress.    Appearance: She is well-developed. She is not diaphoretic.  Eyes:     Conjunctiva/sclera: Conjunctivae normal.  Abdominal:     Tenderness: There is abdominal tenderness (Small amount of epigastric tenderness but much improved from previous exam) in the right lower quadrant, epigastric area and suprapubic area. There is no left CVA tenderness, guarding or rebound.  Genitourinary:    Exam position: Lithotomy position.     Pubic Area: No rash.      Labia:        Right: No rash, tenderness or lesion.        Left: No rash, tenderness or lesion.       Vagina: No signs of injury. Vaginal discharge present. No tenderness or bleeding.     Cervix: Discharge present. No cervical motion tenderness, friability, erythema or cervical bleeding.     Uterus: Normal. Not enlarged, not fixed and not tender.      Adnexa: Left adnexa normal.       Right: Tenderness present. No mass or fullness.         Left: No mass, tenderness or fullness.    Skin:    General: Skin is warm and dry.     Findings: No rash.  Neurological:     Mental Status: She is alert and oriented to person, place, and time.     Coordination: Coordination normal.  Psychiatric:        Behavior: Behavior normal.      Assessment & Plan:   Problem List Items Addressed This Visit   None Visit Diagnoses     Abnormal CT scan, kidney    -  Primary   Relevant Orders   US Renal   Ambulatory referral to Urology   Cervicovaginal ancillary only   Urine Culture   Urinalysis, Complete   Abnormal CT scan, bladder       Relevant Orders   US Renal   Ambulatory referral to Urology   Cervicovaginal ancillary only   Urine Culture   Urinalysis, Complete   Vaginal discharge       Relevant Orders   US Renal   Ambulatory referral to Urology   Cervicovaginal ancillary only   Urine Culture   Urinalysis, Complete       Will do urinalysis but also based on CT scan we will do a renal ultrasound and urology referral for the bladder thickening.  May need to treat for UTI but if persists with pains will go see a specialist.  We will get the referral started.  Seems like this new pain is different than what she was having before.  We will do urinalysis and based on the CT scan she had also do urology referral.  Sent off vaginal swab based on her symptoms as well.  Will await results Follow up plan: Return if symptoms worsen or fail to improve.  Counseling provided for all of the vaccine components No orders of the defined types were placed in this encounter.   Arville Care,  MD [facility] 11/13/2020, 9:11 AM

## 2020-11-14 ENCOUNTER — Inpatient Hospital Stay (HOSPITAL_COMMUNITY)
Admission: EM | Admit: 2020-11-14 | Discharge: 2020-11-19 | DRG: 690 | Disposition: A | Discharge status: Home / Self Care | Payer: Medicaid Other | Attending: Internal Medicine | Admitting: Internal Medicine

## 2020-11-14 ENCOUNTER — Other Ambulatory Visit: Payer: Self-pay

## 2020-11-14 ENCOUNTER — Emergency Department (HOSPITAL_COMMUNITY): Payer: Medicaid Other

## 2020-11-14 ENCOUNTER — Encounter (HOSPITAL_COMMUNITY): Payer: Self-pay | Admitting: Emergency Medicine

## 2020-11-14 DIAGNOSIS — Z20822 Contact with and (suspected) exposure to covid-19: Secondary | ICD-10-CM | POA: Diagnosis present

## 2020-11-14 DIAGNOSIS — B9620 Unspecified Escherichia coli [E. coli] as the cause of diseases classified elsewhere: Secondary | ICD-10-CM | POA: Diagnosis present

## 2020-11-14 DIAGNOSIS — N10 Acute tubulo-interstitial nephritis: Principal | ICD-10-CM | POA: Diagnosis present

## 2020-11-14 DIAGNOSIS — Z8249 Family history of ischemic heart disease and other diseases of the circulatory system: Secondary | ICD-10-CM

## 2020-11-14 DIAGNOSIS — N12 Tubulo-interstitial nephritis, not specified as acute or chronic: Secondary | ICD-10-CM | POA: Diagnosis not present

## 2020-11-14 DIAGNOSIS — Z6834 Body mass index (BMI) 34.0-34.9, adult: Secondary | ICD-10-CM | POA: Diagnosis not present

## 2020-11-14 DIAGNOSIS — E876 Hypokalemia: Secondary | ICD-10-CM

## 2020-11-14 DIAGNOSIS — E669 Obesity, unspecified: Secondary | ICD-10-CM

## 2020-11-14 DIAGNOSIS — I10 Essential (primary) hypertension: Secondary | ICD-10-CM | POA: Diagnosis present

## 2020-11-14 DIAGNOSIS — Z800 Family history of malignant neoplasm of digestive organs: Secondary | ICD-10-CM

## 2020-11-14 DIAGNOSIS — Z91013 Allergy to seafood: Secondary | ICD-10-CM

## 2020-11-14 DIAGNOSIS — Z803 Family history of malignant neoplasm of breast: Secondary | ICD-10-CM

## 2020-11-14 DIAGNOSIS — K219 Gastro-esophageal reflux disease without esophagitis: Secondary | ICD-10-CM | POA: Diagnosis present

## 2020-11-14 DIAGNOSIS — Z79899 Other long term (current) drug therapy: Secondary | ICD-10-CM

## 2020-11-14 LAB — RESP PANEL BY RT-PCR (FLU A&B, COVID) ARPGX2
Influenza A by PCR: NEGATIVE
Influenza B by PCR: NEGATIVE
SARS Coronavirus 2 by RT PCR: NEGATIVE

## 2020-11-14 LAB — COMPREHENSIVE METABOLIC PANEL
ALT: 16 U/L (ref 0–44)
AST: 17 U/L (ref 15–41)
Albumin: 3.8 g/dL (ref 3.5–5.0)
Alkaline Phosphatase: 72 U/L (ref 38–126)
Anion gap: 9 (ref 5–15)
BUN: 11 mg/dL (ref 6–20)
CO2: 22 mmol/L (ref 22–32)
Calcium: 8.8 mg/dL — ABNORMAL LOW (ref 8.9–10.3)
Chloride: 104 mmol/L (ref 98–111)
Creatinine, Ser: 0.72 mg/dL (ref 0.44–1.00)
GFR, Estimated: >60 mL/min (ref >60)
Glucose, Bld: 124 mg/dL — ABNORMAL HIGH (ref 70–99)
Potassium: 3.1 mmol/L — ABNORMAL LOW (ref 3.5–5.1)
Sodium: 135 mmol/L (ref 135–145)
Total Bilirubin: 0.8 mg/dL (ref 0.3–1.2)
Total Protein: 7.5 g/dL (ref 6.5–8.1)

## 2020-11-14 LAB — CBC
HCT: 37.5 % (ref 36.0–46.0)
Hemoglobin: 12.6 g/dL (ref 12.0–15.0)
MCH: 30.7 pg (ref 26.0–34.0)
MCHC: 33.6 g/dL (ref 30.0–36.0)
MCV: 91.2 fL (ref 80.0–100.0)
Platelets: 310 10*3/uL (ref 150–400)
RBC: 4.11 MIL/uL (ref 3.87–5.11)
RDW: 12.7 % (ref 11.5–15.5)
WBC: 11.1 10*3/uL — ABNORMAL HIGH (ref 4.0–10.5)
nRBC: 0 % (ref 0.0–0.2)

## 2020-11-14 LAB — I-STAT BETA HCG BLOOD, ED (MC, WL, AP ONLY): I-stat hCG, quantitative: <5 m[IU]/mL (ref <5)

## 2020-11-14 LAB — URINALYSIS, ROUTINE W REFLEX MICROSCOPIC
Bilirubin Urine: NEGATIVE
Glucose, UA: NEGATIVE mg/dL
Hgb urine dipstick: NEGATIVE
Ketones, ur: 20 mg/dL — AB
Nitrite: POSITIVE — AB
Protein, ur: NEGATIVE mg/dL
Specific Gravity, Urine: 1.015 (ref 1.005–1.030)
WBC, UA: >50 WBC/hpf — ABNORMAL HIGH (ref 0–5)
pH: 7 (ref 5.0–8.0)

## 2020-11-14 LAB — HIV ANTIBODY (ROUTINE TESTING W REFLEX): HIV Screen 4th Generation wRfx: NONREACTIVE

## 2020-11-14 LAB — LIPASE, BLOOD: Lipase: 25 U/L (ref 11–51)

## 2020-11-14 MED ORDER — ADULT MULTIVITAMIN W/MINERALS CH
1.0000 | ORAL_TABLET | Freq: Every day | ORAL | Status: DC
  Administered 2020-11-14 – 2020-11-19 (×6): 1 via ORAL
  Filled 2020-11-14 (×6): qty 1

## 2020-11-14 MED ORDER — SODIUM CHLORIDE 0.9 % IV BOLUS
1000.0000 mL | Freq: Once | INTRAVENOUS | Status: AC
  Administered 2020-11-14: 1000 mL via INTRAVENOUS

## 2020-11-14 MED ORDER — SODIUM CHLORIDE 0.9 % IV SOLN
2.0000 g | INTRAVENOUS | Status: DC
  Administered 2020-11-15 – 2020-11-18 (×4): 2 g via INTRAVENOUS
  Filled 2020-11-14 (×3): qty 2
  Filled 2020-11-14: qty 20
  Filled 2020-11-14: qty 2

## 2020-11-14 MED ORDER — ENOXAPARIN SODIUM 40 MG/0.4ML IJ SOSY
40.0000 mg | PREFILLED_SYRINGE | INTRAMUSCULAR | Status: DC
  Administered 2020-11-14: 40 mg via SUBCUTANEOUS
  Filled 2020-11-14: qty 0.4

## 2020-11-14 MED ORDER — ONDANSETRON HCL 4 MG/2ML IJ SOLN
4.0000 mg | Freq: Four times a day (QID) | INTRAMUSCULAR | Status: DC | PRN
  Administered 2020-11-15 – 2020-11-17 (×6): 4 mg via INTRAVENOUS
  Filled 2020-11-14 (×6): qty 2

## 2020-11-14 MED ORDER — SODIUM CHLORIDE 0.9 % IV SOLN
1.0000 g | Freq: Once | INTRAVENOUS | Status: AC
  Administered 2020-11-14: 1 g via INTRAVENOUS
  Filled 2020-11-14: qty 10

## 2020-11-14 MED ORDER — ACETAMINOPHEN 325 MG PO TABS
650.0000 mg | ORAL_TABLET | Freq: Four times a day (QID) | ORAL | Status: DC | PRN
  Administered 2020-11-14 – 2020-11-16 (×5): 650 mg via ORAL
  Filled 2020-11-14 (×6): qty 2

## 2020-11-14 MED ORDER — MORPHINE SULFATE (PF) 2 MG/ML IV SOLN: 2.0000 mg | INTRAVENOUS | Status: DC | PRN

## 2020-11-14 MED ORDER — LACTATED RINGERS IV SOLN: INTRAVENOUS | Status: DC

## 2020-11-14 MED ORDER — ONDANSETRON HCL 4 MG/2ML IJ SOLN
4.0000 mg | Freq: Once | INTRAMUSCULAR | Status: AC
  Administered 2020-11-14: 4 mg via INTRAVENOUS
  Filled 2020-11-14: qty 2

## 2020-11-14 MED ORDER — PANTOPRAZOLE SODIUM 40 MG PO TBEC
40.0000 mg | DELAYED_RELEASE_TABLET | Freq: Every day | ORAL | Status: DC
  Administered 2020-11-14 – 2020-11-19 (×6): 40 mg via ORAL
  Filled 2020-11-14 (×6): qty 1

## 2020-11-14 MED ORDER — MORPHINE SULFATE (PF) 4 MG/ML IV SOLN
4.0000 mg | Freq: Once | INTRAVENOUS | Status: AC
  Administered 2020-11-14: 4 mg via INTRAVENOUS
  Filled 2020-11-14: qty 1

## 2020-11-14 MED ORDER — SENNOSIDES-DOCUSATE SODIUM 8.6-50 MG PO TABS
2.0000 | ORAL_TABLET | Freq: Every day | ORAL | Status: DC
  Administered 2020-11-14 – 2020-11-18 (×5): 2 via ORAL
  Filled 2020-11-14 (×5): qty 2

## 2020-11-14 MED ORDER — POTASSIUM CHLORIDE CRYS ER 20 MEQ PO TBCR
40.0000 meq | EXTENDED_RELEASE_TABLET | Freq: Two times a day (BID) | ORAL | Status: AC
  Administered 2020-11-14 – 2020-11-15 (×2): 40 meq via ORAL
  Filled 2020-11-14 (×2): qty 2

## 2020-11-14 MED ORDER — HYDROMORPHONE HCL 1 MG/ML IJ SOLN
1.0000 mg | INTRAMUSCULAR | Status: DC | PRN
  Administered 2020-11-14 – 2020-11-19 (×18): 1 mg via INTRAVENOUS
  Filled 2020-11-14 (×20): qty 1

## 2020-11-14 MED ORDER — OXYCODONE HCL 5 MG PO TABS
5.0000 mg | ORAL_TABLET | Freq: Four times a day (QID) | ORAL | Status: DC | PRN
  Administered 2020-11-14 – 2020-11-19 (×14): 5 mg via ORAL
  Filled 2020-11-14 (×15): qty 1

## 2020-11-14 NOTE — H&P (Signed)
History and Physical  Alicia Gilbert MLY:[id number] DOB: [birth year] DOA: 11/14/2020  Referring physician: Dr. Rodena Medin, EDP. PCP: Dettinger, Elige Radon, MD  Outpatient Specialists: GI. Patient coming from: Home.   Chief Complaint: Right sided flank pain and fever of 102 F at home.  HPI: Alicia Gilbert is a 35 y.o. female with medical history significant for nonbleeding gastritis, H. pylori infection completed treatment on 11/12/2020 who presented to [facility] ED with complaints of severe sharp right-sided flank pain and a fever of 102 at home today.  Onset 3 days ago.  Associated with dysuria, nausea and vomiting today.  No diarrhea.  She presented to the ED for further evaluation and management.  Work-up in the ED revealed CT evidence of right-sided pyelonephritis and UA positive for pyuria.  She received Rocephin 1 g x 1 dose, 1 L IV fluid bolus normal saline, IV Zofran 4 mg x 1, IV morphine 4 mg x 1.  TRH, hospitalist team, asked to admit.  ED Course:  Temperature 99.5.  BP 114/68, pulse 88, respiratory rate 23, O2 saturation 100% on room air.  Lab studies remarkable for serum sodium 125, potassium 3.1, serum bicarb 22, glucose 124, BUN 11, creatinine 0.72, LFTs normal.  WBC 11.1, hemoglobin 12.6, MCV 91, platelet count 310. I-stat Hcg <5.0.  Review of Systems: Review of systems as noted in the HPI. All other systems reviewed and are negative.   Past Medical History:  Diagnosis Date   H. pylori infection    s/p treatment   Heart murmur    childhood   Hypertension    Kidney infection    Past Surgical History:  Procedure Laterality Date   APPENDECTOMY     BIOPSY  09/28/2020   Procedure: BIOPSY;  Surgeon: Lanelle Bal, DO;  Location: [facility];  Service: Endoscopy;;   ESOPHAGOGASTRODUODENOSCOPY (EGD) WITH PROPOFOL N/A 09/28/2020   Procedure: ESOPHAGOGASTRODUODENOSCOPY (EGD) WITH PROPOFOL;  Surgeon: Lanelle Bal, DO;  Location: [facility];  Service: Endoscopy;   Laterality: N/A;  AM    Social History:  reports that she has never smoked. She has never used smokeless tobacco. She reports that she does not drink alcohol and does not use drugs.   Allergies  Allergen Reactions   Penicillins Hives and Rash    Reaction: Childhood   Shellfish Allergy Hives and Swelling    Family History  Problem Relation Age of Onset   Heart disease Mother    Gastric cancer Mother    Breast cancer Mother    Heart disease Father    Colon cancer Neg Hx       Prior to Admission medications   Medication Sig Start Date End Date Taking? Authorizing Provider  bismuth-metronidazole-tetracycline (PYLERA) [phone] MG capsule Take 3 capsules by mouth 4 (four) times daily -  before meals and at bedtime for 21 days. 10/22/20 11/12/20  Dettinger, Elige Radon, MD  Multiple Vitamins-Minerals (MULTIVITAMIN WITH MINERALS) tablet Take 1 tablet by mouth daily. Woman's    [provider]    Physical Exam: BP 114/68   Pulse 88   Temp 98.2 F (36.8 C) (Oral)   Resp (!) 23   LMP 11/04/2020   SpO2 100%   General: 35 y.o. year-old female well developed well nourished in no acute distress.  Alert and oriented x3. Uncomfortable due to R flank pain. Cardiovascular: Regular rate and rhythm with no rubs or gallops.  No thyromegaly or JVD noted.  No lower extremity edema. 2/4 pulses in  all 4 extremities. Respiratory: Clear to auscultation with no wheezes or rales. Good inspiratory effort. Abdomen: Soft RUQ tenderness with palpation.  Nondistended with normal bowel sounds x4 quadrants. Muskuloskeletal: No cyanosis, clubbing or edema noted bilaterally Neuro: CN II-XII intact, strength, sensation, reflexes Skin: No ulcerative lesions noted or rashes Psychiatry: Judgement and insight appear normal. Mood is appropriate for condition and setting          Labs on Admission:  Basic Metabolic Panel: Recent Labs  Lab 11/14/20 1715  NA 135  K 3.1*  CL 104  CO2 22  GLUCOSE 124*   BUN 11  CREATININE 0.72  CALCIUM 8.8*   Liver Function Tests: Recent Labs  Lab 11/14/20 1715  AST 17  ALT 16  ALKPHOS 72  BILITOT 0.8  PROT 7.5  ALBUMIN 3.8   Recent Labs  Lab 11/14/20 1715  LIPASE 25   No results for input(s): AMMONIA in the last 168 hours. CBC: Recent Labs  Lab 11/14/20 1715  WBC 11.1*  HGB 12.6  HCT 37.5  MCV 91.2  PLT 310   Cardiac Enzymes: No results for input(s): CKTOTAL, CKMB, CKMBINDEX, TROPONINI in the last 168 hours.  BNP (last 3 results) Recent Labs    07/14/20 1020  BNP 57.0    ProBNP (last 3 results) No results for input(s): PROBNP in the last 8760 hours.  CBG: No results for input(s): GLUCAP in the last 168 hours.  Radiological Exams on Admission: CT ABDOMEN PELVIS WO CONTRAST  Result Date: 11/14/2020 CLINICAL DATA:  Right flank/abdominal pain EXAM: CT ABDOMEN AND PELVIS WITHOUT CONTRAST TECHNIQUE: Multidetector CT imaging of the abdomen and pelvis was performed following the standard protocol without IV contrast. COMPARISON:  CT 11/06/2020 FINDINGS: Lower chest: Lung bases are clear. Normal heart size. No pericardial effusion. Hepatobiliary: No visible focal liver lesions. Smooth liver surface contour. Normal hepatic attenuation. Normal gallbladder and biliary tree. No visible calcified gallstone. Pancreas: No pancreatic ductal dilatation or surrounding inflammatory changes. Spleen: Normal in size. No concerning splenic lesions. Adrenals/Urinary Tract: Normal adrenals. Some asymmetric right perinephric and periureteral stranding with mild urothelial thickening. No visible obstructing urolith. Bladder decompressed with some mild nonspecific wall thickening though faint perivesicular hazy stranding could suggest a superimposed cystitis. No significant stranding or abnormality of the left proximal urinary tract. Stomach/Bowel: Distal esophagus, stomach and duodenal sweep are unremarkable. No small bowel wall thickening or dilatation. No  evidence of obstruction. A normal appendix is visualized. No colonic dilatation or wall thickening. Vascular/Lymphatic: No significant vascular findings are present. No enlarged abdominal or pelvic lymph nodes. Reproductive: Anteverted uterus. Radiopaque IUD in expected positioning. Other: No abdominopelvic free fluid or free gas. No bowel containing hernias. Musculoskeletal: No acute osseous abnormality or suspicious osseous lesion. IMPRESSION: Asymmetric right perinephric, periureteral and perivesicular stranding with some slight urothelial bladder wall thickening. May reflect an ascending tract infection versus a recently passed calculus. No frank hydronephrosis is seen at this time. Hypoattenuating focus in the left interpolar kidney is not well appreciated on this unenhanced CT. No other gross left urinary tract abnormality. Electronically Signed   By: Kreg Shropshire M.D.   On: 11/14/2020 20:15    EKG: I independently viewed the EKG done and my findings are as followed: Sinus rhythm rate of 82.  Nonspecific ST-T changes.  QTc 415.  Assessment/Plan Present on Admission:  Pyelonephritis of right kidney  Active Problems:   Pyelonephritis of right kidney  Right-sided pyelonephritis, complicated UTI, POA Presented with severe right-sided flank pain and  fever of 102 F at home.  Associated with dysuria, nausea and vomiting. UA positive for pyuria CT abdomen and pelvis without contrast revealed asymmetric right perinephric, periureteral and perivesicular stranding with some slight urothelial bladder wall thickening. Obtain peripheral blood cultures x2 and urine culture. Follow cultures for ID and sensitivities. Resume Rocephin, increase dose to 2 g daily. Monitor fever curve and WBC. Pain control with IV Dilaudid 1 mg every 4 hours as needed for severe pain, oxycodone 5 mg every 6 hours as needed for moderate pain and for breakthrough pain. IV fluid hydration IV antiemetics Bowel  regimen  Hypokalemia Potassium 3.1 Repleted orally with KCl 40 mEq x 2 doses. Repeat BMP and obtain magnesium level in the morning  History of nonbleeding gastritis seen on EGD 09/28/20 by Dr. Marletta Lor. Start daily 40 mg Protonix  History of H. pylori infection Has completed her treatment on 11/12/2020. Recommend follow up outpatient with GI  Obesity BMI 34 Recommend weight loss outpatient with healthy dieting and regular physical activity.    DVT prophylaxis: Subcu Lovenox daily  Code Status: Full code  Family Communication: None at bedside.  Spoke with her daughter, 37, via phone.  Disposition Plan: At admit to MedSurg unit with remote telemetry  Consults called: None.  Admission status: Inpatient status.  Patient will require at least 2 midnights for further evaluation and treatment of present condition.   Status is: Inpatient    Dispo:  Patient From: Home  Planned Disposition: Home, possibly on 11/16/2020.  Medically stable for discharge: No         Darlin Drop MD [facility] Pager [phone]  If 7PM-7AM, please contact night-coverage [email or web address] Password [facility]  11/14/2020, 8:53 PM

## 2020-11-14 NOTE — ED Triage Notes (Signed)
C/o RUQ pain x 2 days with nausea and vomiting.

## 2020-11-14 NOTE — ED Notes (Signed)
Admitting MD at bedside.

## 2020-11-14 NOTE — ED Notes (Signed)
This NT brought pt back to room, pt began demanding for pain medicine for pt's pain. This NT notified pt to talk to the MD first. Once in room, pt instructed to place both feet on right side of wheelchair and try to sit on side of stretcher. Pt slide to the front of wheelchair and got on one knee and tried laying down on the floor. This NT instructed pt to stand up and sit on the side of the stretcher. Pt stood up with assistance and undressed and placed gown on. Pt on monitor, notified Sarah(RN).

## 2020-11-14 NOTE — ED Provider Notes (Signed)
[facility] EMERGENCY DEPARTMENT Provider Note   CSN: [id number] Arrival date & time: 11/14/20  1653     History No chief complaint on file.   Alicia Gilbert is a 35 y.o. female.  35 year old female with prior medical history as detailed below presents for evaluation.  Patient complains of persistent right-sided flank discomfort for the last 3 to 4 days.  Patient complains of fever at home with a T-max of 102.  She complains of persistent nausea and vomiting.  The history is provided by the patient and medical records.  Illness Location:  Fever, right-sided flank pain, nausea, vomiting Severity:  Moderate Onset quality:  Gradual Duration:  4 days Timing:  Constant Progression:  Worsening Chronicity:  New     Past Medical History:  Diagnosis Date   H. pylori infection    s/p treatment   Heart murmur    childhood   Hypertension    Kidney infection     Patient Active Problem List   Diagnosis Date Noted   Pyelonephritis of right kidney 11/14/2020   Abdominal pain, epigastric 08/19/2020   GERD (gastroesophageal reflux disease) 08/19/2020   Family history of gastric cancer 08/19/2020   History of Helicobacter pylori infection 08/19/2020   DOE and chronic cough ? etiology  07/14/2020   Supervision of other normal pregnancy, antepartum 06/07/2019   Vaginal discharge during pregnancy, antepartum 06/07/2019   Language barrier 06/07/2019   Abdominal pain affecting pregnancy 05/27/2019    Past Surgical History:  Procedure Laterality Date   APPENDECTOMY     BIOPSY  09/28/2020   Procedure: BIOPSY;  Surgeon: Lanelle Bal, DO;  Location: [facility] ENDO SUITE;  Service: Endoscopy;;   ESOPHAGOGASTRODUODENOSCOPY (EGD) WITH PROPOFOL N/A 09/28/2020   Procedure: ESOPHAGOGASTRODUODENOSCOPY (EGD) WITH PROPOFOL;  Surgeon: Lanelle Bal, DO;  Location: [facility] ENDO SUITE;  Service: Endoscopy;  Laterality: N/A;  AM     OB History     Gravida  5   Para  2    Term  2   Preterm  0   AB  1   Living  4      SAB  1   IAB  0   Ectopic  0   Multiple      Live Births  4           Family History  Problem Relation Age of Onset   Heart disease Mother    Gastric cancer Mother    Breast cancer Mother    Heart disease Father    Colon cancer Neg Hx     Social History   Tobacco Use   Smoking status: Never   Smokeless tobacco: Never  Vaping Use   Vaping Use: Never used  Substance Use Topics   Alcohol use: Never   Drug use: Never    Home Medications Prior to Admission medications   Medication Sig Start Date End Date Taking? Authorizing Provider  bismuth-metronidazole-tetracycline (PYLERA) [phone] MG capsule Take 3 capsules by mouth 4 (four) times daily -  before meals and at bedtime for 21 days. 10/22/20 11/12/20  Dettinger, Elige Radon, MD  Multiple Vitamins-Minerals (MULTIVITAMIN WITH MINERALS) tablet Take 1 tablet by mouth daily. Woman's    [provider]    Allergies    Penicillins and Shellfish allergy  Review of Systems   Review of Systems  All other systems reviewed and are negative.  Physical Exam Updated Vital Signs BP 131/83   Pulse 68   Temp 98.3  F (36.8 C) (Oral)   Resp 20   LMP 11/04/2020   SpO2 100%   Physical Exam Vitals and nursing note reviewed.  Constitutional:      General: She is not in acute distress.    Appearance: Normal appearance. She is well-developed.  HENT:     Head: Normocephalic and atraumatic.  Eyes:     Conjunctiva/sclera: Conjunctivae normal.     Pupils: Pupils are equal, round, and reactive to light.  Cardiovascular:     Rate and Rhythm: Regular rhythm. Tachycardia present.     Heart sounds: Normal heart sounds.  Pulmonary:     Effort: Pulmonary effort is normal. No respiratory distress.     Breath sounds: Normal breath sounds.  Abdominal:     General: There is no distension.     Palpations: Abdomen is soft.     Tenderness: There is no abdominal tenderness.   Genitourinary:    Comments: Right-sided CVA tenderness Musculoskeletal:        General: No deformity. Normal range of motion.     Cervical back: Normal range of motion and neck supple.  Skin:    General: Skin is warm and dry.  Neurological:     General: No focal deficit present.     Mental Status: She is alert and oriented to person, place, and time.    ED Results / Procedures / Treatments   Labs (all labs ordered are listed, but only abnormal results are displayed) Labs Reviewed  COMPREHENSIVE METABOLIC PANEL - Abnormal; Notable for the following components:      Result Value   Potassium 3.1 (*)    Glucose, Bld 124 (*)    Calcium 8.8 (*)    All other components within normal limits  CBC - Abnormal; Notable for the following components:   WBC 11.1 (*)    All other components within normal limits  URINALYSIS, ROUTINE W REFLEX MICROSCOPIC - Abnormal; Notable for the following components:   APPearance HAZY (*)    Ketones, ur 20 (*)    Nitrite POSITIVE (*)    Leukocytes,Ua LARGE (*)    WBC, UA >50 (*)    Bacteria, UA RARE (*)    All other components within normal limits  RESP PANEL BY RT-PCR (FLU A&B, COVID) ARPGX2  URINE CULTURE  CULTURE, BLOOD (ROUTINE X 2)  CULTURE, BLOOD (ROUTINE X 2)  LIPASE, BLOOD  HIV ANTIBODY (ROUTINE TESTING W REFLEX)  CBC  CREATININE, SERUM  CBC  BASIC METABOLIC PANEL  MAGNESIUM  PHOSPHORUS  I-STAT BETA HCG BLOOD, ED ([facility], [facility], [facility] ONLY)    EKG EKG Interpretation  Date/Time:  Saturday November 14 2020 19:41:23 EDT Ventricular Rate:  82 PR Interval:  148 QRS Duration: 89 QT Interval:  355 QTC Calculation: 415 R Axis:   70 Text Interpretation: Sinus rhythm Confirmed by Kristine RoyalMessick, Aquinnah Devin [phone]([id number]) on 11/14/2020 7:42:57 PM  Radiology CT ABDOMEN PELVIS WO CONTRAST  Result Date: 11/14/2020 CLINICAL DATA:  Right flank/abdominal pain EXAM: CT ABDOMEN AND PELVIS WITHOUT CONTRAST TECHNIQUE: Multidetector CT imaging of the abdomen and pelvis was performed  following the standard protocol without IV contrast. COMPARISON:  CT 11/06/2020 FINDINGS: Lower chest: Lung bases are clear. Normal heart size. No pericardial effusion. Hepatobiliary: No visible focal liver lesions. Smooth liver surface contour. Normal hepatic attenuation. Normal gallbladder and biliary tree. No visible calcified gallstone. Pancreas: No pancreatic ductal dilatation or surrounding inflammatory changes. Spleen: Normal in size. No concerning splenic lesions. Adrenals/Urinary Tract: Normal adrenals. Some asymmetric right perinephric  and periureteral stranding with mild urothelial thickening. No visible obstructing urolith. Bladder decompressed with some mild nonspecific wall thickening though faint perivesicular hazy stranding could suggest a superimposed cystitis. No significant stranding or abnormality of the left proximal urinary tract. Stomach/Bowel: Distal esophagus, stomach and duodenal sweep are unremarkable. No small bowel wall thickening or dilatation. No evidence of obstruction. A normal appendix is visualized. No colonic dilatation or wall thickening. Vascular/Lymphatic: No significant vascular findings are present. No enlarged abdominal or pelvic lymph nodes. Reproductive: Anteverted uterus. Radiopaque IUD in expected positioning. Other: No abdominopelvic free fluid or free gas. No bowel containing hernias. Musculoskeletal: No acute osseous abnormality or suspicious osseous lesion. IMPRESSION: Asymmetric right perinephric, periureteral and perivesicular stranding with some slight urothelial bladder wall thickening. May reflect an ascending tract infection versus a recently passed calculus. No frank hydronephrosis is seen at this time. Hypoattenuating focus in the left interpolar kidney is not well appreciated on this unenhanced CT. No other gross left urinary tract abnormality. Electronically Signed   By: Kreg Shropshire M.D.   On: 11/14/2020 20:15    Procedures Procedures   Medications  Ordered in ED Medications  enoxaparin (LOVENOX) injection 40 mg (has no administration in time range)  cefTRIAXone (ROCEPHIN) 2 g in sodium chloride 0.9 % 100 mL IVPB (has no administration in time range)  multivitamin with minerals tablet 1 tablet (has no administration in time range)  ondansetron (ZOFRAN) injection 4 mg (has no administration in time range)  acetaminophen (TYLENOL) tablet 650 mg (has no administration in time range)  oxyCODONE (Oxy IR/ROXICODONE) immediate release tablet 5 mg (has no administration in time range)  senna-docusate (Senokot-S) tablet 2 tablet (has no administration in time range)  lactated ringers infusion (has no administration in time range)  potassium chloride SA (KLOR-CON) CR tablet 40 mEq (has no administration in time range)  HYDROmorphone (DILAUDID) injection 1 mg (1 mg Intravenous Given 11/14/20 2103)  sodium chloride 0.9 % bolus 1,000 mL (0 mLs Intravenous Stopped 11/14/20 2048)  ondansetron (ZOFRAN) injection 4 mg (4 mg Intravenous Given 11/14/20 1941)  morphine 4 MG/ML injection 4 mg (4 mg Intravenous Given 11/14/20 1941)  cefTRIAXone (ROCEPHIN) 1 g in sodium chloride 0.9 % 100 mL IVPB (0 g Intravenous Stopped 11/14/20 2048)    ED Course  I have reviewed the triage vital signs and the nursing notes.  Pertinent labs & imaging results that were available during my care of the patient were reviewed by me and considered in my medical decision making (see chart for details).    MDM Rules/Calculators/A&P                          MDM  MSE complete  Chandelle Harkey was evaluated in Emergency Department on 11/14/2020 for the symptoms described in the history of present illness. She was evaluated in the context of the global COVID-19 pandemic, which necessitated consideration that the patient might be at risk for infection with the SARS-CoV-2 virus that causes COVID-19. Institutional protocols and algorithms that pertain to the evaluation of patients at  risk for COVID-19 are in a state of rapid change based on information released by regulatory bodies including the CDC and federal and state organizations. These policies and algorithms were followed during the patient's care in the ED.  Patient is presenting with right-sided flank pain, fever, nausea, and vomiting.  Presentation is concerning for possible pyelonephritis.  UA is suggestive of infection with CT imaging suggesting presence  of pyelonephritis.  Patient feels improved with IV fluids and antiemetics.  However, patient would benefit from overnight admission for further treatment.  Hospitalist service is aware of case and will evaluate for admission.   Final Clinical Impression(s) / ED Diagnoses Final diagnoses:  Pyelonephritis    Rx / DC Orders ED Discharge Orders     None        Wynetta Fines, MD 11/14/20 2109

## 2020-11-15 ENCOUNTER — Encounter (HOSPITAL_COMMUNITY): Payer: Self-pay | Admitting: Internal Medicine

## 2020-11-15 DIAGNOSIS — E876 Hypokalemia: Secondary | ICD-10-CM | POA: Diagnosis not present

## 2020-11-15 DIAGNOSIS — N12 Tubulo-interstitial nephritis, not specified as acute or chronic: Secondary | ICD-10-CM | POA: Diagnosis not present

## 2020-11-15 LAB — CBC
HCT: 32.9 % — ABNORMAL LOW (ref 36.0–46.0)
Hemoglobin: 11 g/dL — ABNORMAL LOW (ref 12.0–15.0)
MCH: 30.5 pg (ref 26.0–34.0)
MCHC: 33.4 g/dL (ref 30.0–36.0)
MCV: 91.1 fL (ref 80.0–100.0)
Platelets: 254 10*3/uL (ref 150–400)
RBC: 3.61 MIL/uL — ABNORMAL LOW (ref 3.87–5.11)
RDW: 12.8 % (ref 11.5–15.5)
WBC: 11.1 10*3/uL — ABNORMAL HIGH (ref 4.0–10.5)
nRBC: 0 % (ref 0.0–0.2)

## 2020-11-15 LAB — BASIC METABOLIC PANEL
Anion gap: 6 (ref 5–15)
BUN: 8 mg/dL (ref 6–20)
CO2: 24 mmol/L (ref 22–32)
Calcium: 8 mg/dL — ABNORMAL LOW (ref 8.9–10.3)
Chloride: 104 mmol/L (ref 98–111)
Creatinine, Ser: 0.65 mg/dL (ref 0.44–1.00)
GFR, Estimated: >60 mL/min (ref >60)
Glucose, Bld: 160 mg/dL — ABNORMAL HIGH (ref 70–99)
Potassium: 3.7 mmol/L (ref 3.5–5.1)
Sodium: 134 mmol/L — ABNORMAL LOW (ref 135–145)

## 2020-11-15 LAB — PHOSPHORUS: Phosphorus: 2.9 mg/dL (ref 2.5–4.6)

## 2020-11-15 LAB — MAGNESIUM: Magnesium: 1.9 mg/dL (ref 1.7–2.4)

## 2020-11-15 MED ORDER — ENOXAPARIN SODIUM 40 MG/0.4ML IJ SOSY
40.0000 mg | PREFILLED_SYRINGE | INTRAMUSCULAR | Status: DC
  Administered 2020-11-15 – 2020-11-19 (×5): 40 mg via SUBCUTANEOUS
  Filled 2020-11-15 (×5): qty 0.4

## 2020-11-15 NOTE — Progress Notes (Signed)
PROGRESS NOTE    Alicia Gilbert  JXB:[id number] DOB: 12/22/1985 DOA: 11/14/2020 PCP: Dettinger, Elige RadonJoshua A, MD   Brief Narrative: Alicia Gilbert is a 35 y.o. female with a history of gastritis and obesity. Patient presented secondary to worsening right flank pain for the past two weeks with associated fever. She was found to have a right pyelonephritis. No abscess identified on imaging. Started empirically on Ceftriaxone. Urine and blood cultures are pending.   Assessment & Plan:   Active Problems:   Pyelonephritis of right kidney   Hypokalemia   Acute pyelonephritis Right sided. Associated fevers at home. CT evidence of ascending infection. Urinalysis suggests UTI with urine culture and blood culture obtained/pending. Started empirically on Ceftriaxone IV. -Continue Ceftriaxone IV -Continue analgesics and IV fluids  Hypokalemia Supplementation given. Resolved.  History of gastritis History of H. Pylori Completed treatment.  Obesity Body mass index is 34.6 kg/m.   DVT prophylaxis: Lovenox Code Status:   Code Status: Full Code Family Communication: Mother and aunt at bedside Disposition Plan: Discharge home likely in 3-5 days pending improvement of infection/pain and transition to oral antibiotics   Consultants:  None  Procedures:  None  Antimicrobials: Ceftriaxone IV    Subjective:  Spanish InterpreterBoneta Lucks: Jenny #[id number]  Objective: Vitals:   11/15/20 0007 11/15/20 0422 11/15/20 0422 11/15/20 0823  BP: 117/67 101/65 101/65 118/84  Pulse: 99 66 66 84  Resp: 17 18  19   Temp: 98.8 F (37.1 C) 99 F (37.2 C) 99 F (37.2 C) 98.5 F (36.9 C)  TempSrc: Oral Oral Oral Oral  SpO2: 96% 97% 96% 97%  Weight:      Height:        Intake/Output Summary (Last 24 hours) at 11/15/2020 0919 Last data filed at 11/15/2020 0827 Gross per 24 hour  Intake 638.5 ml  Output 901 ml  Net -262.5 ml   Filed Weights   11/14/20 2200  Weight: 85.8 kg     Examination:  General exam: Appears calm but uncomfortable and in no acute distress. Ill appearing. Conversant Respiratory: Clear to auscultation. Respiratory effort normal with no intercostal retractions or use of accessory muscles Cardiovascular: S1 & S2 heard, RRR. No murmurs, rubs, gallops or clicks. No edema Gastrointestinal: Abdomen is nondistended, soft and nontender. No masses felt. Normal bowel sounds heard Neurologic: No focal neurological deficits Musculoskeletal: No calf tenderness Skin: No cyanosis. No new rashes Psychiatry: Alert and oriented. Memory intact. Mood & affect appropriate    Data Reviewed: I have personally reviewed following labs and imaging studies  CBC Lab Results  Component Value Date   WBC 11.1 (H) 11/15/2020   RBC 3.61 (L) 11/15/2020   HGB 11.0 (L) 11/15/2020   HCT 32.9 (L) 11/15/2020   MCV 91.1 11/15/2020   MCH 30.5 11/15/2020   PLT 254 11/15/2020   MCHC 33.4 11/15/2020   RDW 12.8 11/15/2020   LYMPHSABS 1.4 11/06/2020   MONOABS 0.6 11/06/2020   EOSABS 0.0 11/06/2020   BASOSABS 0.0 11/06/2020     Last metabolic panel Lab Results  Component Value Date   NA 134 (L) 11/15/2020   K 3.7 11/15/2020   CL 104 11/15/2020   CO2 24 11/15/2020   BUN 8 11/15/2020   CREATININE 0.65 11/15/2020   GLUCOSE 160 (H) 11/15/2020   GFRNONAA >60 11/15/2020   GFRAA >60 01/13/2020   CALCIUM 8.0 (L) 11/15/2020   PHOS 2.9 11/15/2020   PROT 7.5 11/14/2020   ALBUMIN 3.8 11/14/2020   BILITOT 0.8 11/14/2020  ALKPHOS 72 11/14/2020   AST 17 11/14/2020   ALT 16 11/14/2020   ANIONGAP 6 11/15/2020    CBG (last 3)  No results for input(s): GLUCAP in the last 72 hours.   GFR: Estimated Creatinine Clearance: 100.7 mL/min (by C-G formula based on SCr of 0.65 mg/dL).  Coagulation Profile: No results for input(s): INR, PROTIME in the last 168 hours.  Recent Results (from the past 240 hour(s))  Urine Culture     Status: Abnormal (Preliminary result)    Collection Time: 11/13/20  9:28 AM   Specimen: Urine   UR  Result Value Ref Range Status   Urine Culture, Routine Preliminary report (A)  Preliminary   Organism ID, Bacteria Gram negative rods (A)  Preliminary    Comment: Greater than 100,000 colony forming units per mL   ORGANISM ID, BACTERIA Comment  Preliminary    Comment: Microbiological testing to rule out the presence of possible pathogens is in progress.   Microscopic Examination     Status: Abnormal   Collection Time: 11/13/20  9:28 AM   Urine  Result Value Ref Range Status   WBC, UA 6-10 (A) 0 - 5 /hpf Final   RBC 0-2 0 - 2 /hpf Final   Epithelial Cells (non renal) 0-10 0 - 10 /hpf Final   Bacteria, UA Few None seen/Few Final  Resp Panel by RT-PCR (Flu A&B, Covid) Nasopharyngeal Swab     Status: None   Collection Time: 11/14/20  8:31 PM   Specimen: Nasopharyngeal Swab; Nasopharyngeal(NP) swabs in vial transport medium  Result Value Ref Range Status   SARS Coronavirus 2 by RT PCR NEGATIVE NEGATIVE Final    Comment: (NOTE) SARS-CoV-2 target nucleic acids are NOT DETECTED.  The SARS-CoV-2 RNA is generally detectable in upper respiratory specimens during the acute phase of infection. The lowest concentration of SARS-CoV-2 viral copies this assay can detect is 138 copies/mL. A negative result does not preclude SARS-Cov-2 infection and should not be used as the sole basis for treatment or other patient management decisions. A negative result may occur with  improper specimen collection/handling, submission of specimen other than nasopharyngeal swab, presence of viral mutation(s) within the areas targeted by this assay, and inadequate number of viral copies(<138 copies/mL). A negative result must be combined with clinical observations, patient history, and epidemiological information. The expected result is Negative.  Fact Sheet for Patients:  BloggerCourse.com  Fact Sheet for Healthcare Providers:   SeriousBroker.it  This test is no t yet approved or cleared by the [place] FDA and  has been authorized for detection and/or diagnosis of SARS-CoV-2 by FDA under an Emergency Use Authorization (EUA). This EUA will remain  in effect (meaning this test can be used) for the duration of the COVID-19 declaration under Section 564(b)(1) of the Act, 21 U.S.C.section 360bbb-3(b)(1), unless the authorization is terminated  or revoked sooner.       Influenza A by PCR NEGATIVE NEGATIVE Final   Influenza B by PCR NEGATIVE NEGATIVE Final    Comment: (NOTE) The Xpert Xpress SARS-CoV-2/FLU/RSV plus assay is intended as an aid in the diagnosis of influenza from Nasopharyngeal swab specimens and should not be used as a sole basis for treatment. Nasal washings and aspirates are unacceptable for Xpert Xpress SARS-CoV-2/FLU/RSV testing.  Fact Sheet for Patients: BloggerCourse.com  Fact Sheet for Healthcare Providers: SeriousBroker.it  This test is not yet approved or cleared by the [place] FDA and has been authorized for detection and/or diagnosis of SARS-CoV-2 by  FDA under an Emergency Use Authorization (EUA). This EUA will remain in effect (meaning this test can be used) for the duration of the COVID-19 declaration under Section 564(b)(1) of the Act, 21 U.S.C. section 360bbb-3(b)(1), unless the authorization is terminated or revoked.  Performed at [facility], [address], [place], [place] [address]   Culture, blood (routine x 2)     Status: None (Preliminary result)   Collection Time: 11/14/20  9:47 PM   Specimen: BLOOD  Result Value Ref Range Status   Specimen Description BLOOD SITE NOT SPECIFIED  Final   Special Requests   Final    BOTTLES DRAWN AEROBIC AND ANAEROBIC Blood Culture adequate volume   Culture   Final    NO GROWTH < 12 HOURS Performed at [facility], [address], [place], [place] [address]    Report Status PENDING  Incomplete  Culture, blood (routine x 2)     Status: None (Preliminary result)   Collection Time: 11/14/20  9:47 PM   Specimen: BLOOD  Result Value Ref Range Status   Specimen Description BLOOD SITE NOT SPECIFIED  Final   Special Requests   Final    BOTTLES DRAWN AEROBIC AND ANAEROBIC Blood Culture adequate volume   Culture   Final    NO GROWTH < 12 HOURS Performed at [facility], [address], [place], [place] [address]    Report Status PENDING  Incomplete        Radiology Studies: CT ABDOMEN PELVIS WO CONTRAST  Result Date: 11/14/2020 CLINICAL DATA:  Right flank/abdominal pain EXAM: CT ABDOMEN AND PELVIS WITHOUT CONTRAST TECHNIQUE: Multidetector CT imaging of the abdomen and pelvis was performed following the standard protocol without IV contrast. COMPARISON:  CT 11/06/2020 FINDINGS: Lower chest: Lung bases are clear. Normal heart size. No pericardial effusion. Hepatobiliary: No visible focal liver lesions. Smooth liver surface contour. Normal hepatic attenuation. Normal gallbladder and biliary tree. No visible calcified gallstone. Pancreas: No pancreatic ductal dilatation or surrounding inflammatory changes. Spleen: Normal in size. No concerning splenic lesions. Adrenals/Urinary Tract: Normal adrenals. Some asymmetric right perinephric and periureteral stranding with mild urothelial thickening. No visible obstructing urolith. Bladder decompressed with some mild nonspecific wall thickening though faint perivesicular hazy stranding could suggest a superimposed cystitis. No significant stranding or abnormality of the left proximal urinary tract. Stomach/Bowel: Distal esophagus, stomach and duodenal sweep are unremarkable. No small bowel wall thickening or dilatation. No evidence of obstruction. A normal appendix is visualized. No colonic dilatation or wall thickening. Vascular/Lymphatic: No significant vascular findings are present.  No enlarged abdominal or pelvic lymph nodes. Reproductive: Anteverted uterus. Radiopaque IUD in expected positioning. Other: No abdominopelvic free fluid or free gas. No bowel containing hernias. Musculoskeletal: No acute osseous abnormality or suspicious osseous lesion. IMPRESSION: Asymmetric right perinephric, periureteral and perivesicular stranding with some slight urothelial bladder wall thickening. May reflect an ascending tract infection versus a recently passed calculus. No frank hydronephrosis is seen at this time. Hypoattenuating focus in the left interpolar kidney is not well appreciated on this unenhanced CT. No other gross left urinary tract abnormality. Electronically Signed   By: Kreg Shropshire M.D.   On: 11/14/2020 20:15        Scheduled Meds:  enoxaparin (LOVENOX) injection  40 mg Subcutaneous Q24H   multivitamin with minerals  1 tablet Oral Daily   pantoprazole  40 mg Oral Daily   potassium chloride  40 mEq Oral BID   senna-docusate  2 tablet  Oral QHS   Continuous Infusions:  cefTRIAXone (ROCEPHIN)  IV     lactated ringers 50 mL/hr at 11/15/20 4098     LOS: 1 day     Jacquelin Hawking, MD [facility] 11/15/2020, 9:19 AM  If 7PM-7AM, please contact night-coverage [email or web address]

## 2020-11-15 NOTE — Plan of Care (Signed)
  Problem: Education: Goal: Knowledge of General Education information will improve Description: Including pain rating scale, medication(s)/side effects and non-pharmacologic comfort measures Outcome: Progressing   Problem: Health Behavior/Discharge Planning: Goal: Ability to manage health-related needs will improve Outcome: Progressing   Problem: Clinical Measurements: Goal: Ability to maintain clinical measurements within normal limits will improve Outcome: Progressing   Problem: Activity: Goal: Risk for activity intolerance will decrease Outcome: Progressing   Problem: Nutrition: Goal: Adequate nutrition will be maintained Outcome: Progressing   Problem: Elimination: Goal: Will not experience complications related to urinary retention Outcome: Progressing   Problem: Pain Managment: Goal: General experience of comfort will improve Outcome: Progressing   Problem: Safety: Goal: Ability to remain free from injury will improve Outcome: Progressing   Problem: Skin Integrity: Goal: Risk for impaired skin integrity will decrease Outcome: Progressing   

## 2020-11-16 ENCOUNTER — Other Ambulatory Visit: Payer: Self-pay | Admitting: Family Medicine

## 2020-11-16 DIAGNOSIS — N12 Tubulo-interstitial nephritis, not specified as acute or chronic: Secondary | ICD-10-CM | POA: Diagnosis not present

## 2020-11-16 DIAGNOSIS — E669 Obesity, unspecified: Secondary | ICD-10-CM

## 2020-11-16 DIAGNOSIS — B373 Candidiasis of vulva and vagina: Secondary | ICD-10-CM

## 2020-11-16 DIAGNOSIS — B3731 Acute candidiasis of vulva and vagina: Secondary | ICD-10-CM

## 2020-11-16 DIAGNOSIS — E876 Hypokalemia: Secondary | ICD-10-CM | POA: Diagnosis not present

## 2020-11-16 LAB — BASIC METABOLIC PANEL
Anion gap: 8 (ref 5–15)
BUN: 5 mg/dL — ABNORMAL LOW (ref 6–20)
CO2: 25 mmol/L (ref 22–32)
Calcium: 8.5 mg/dL — ABNORMAL LOW (ref 8.9–10.3)
Chloride: 101 mmol/L (ref 98–111)
Creatinine, Ser: 0.75 mg/dL (ref 0.44–1.00)
GFR, Estimated: >60 mL/min (ref >60)
Glucose, Bld: 98 mg/dL (ref 70–99)
Potassium: 4.1 mmol/L (ref 3.5–5.1)
Sodium: 134 mmol/L — ABNORMAL LOW (ref 135–145)

## 2020-11-16 LAB — CERVICOVAGINAL ANCILLARY ONLY
Bacterial Vaginitis (gardnerella): NEGATIVE
Candida Glabrata: NEGATIVE
Candida Vaginitis: POSITIVE — AB
Chlamydia: NEGATIVE
Comment: NEGATIVE
Comment: NEGATIVE
Comment: NEGATIVE
Comment: NEGATIVE
Comment: NEGATIVE
Comment: NORMAL
Neisseria Gonorrhea: NEGATIVE
Trichomonas: NEGATIVE

## 2020-11-16 LAB — CBC
HCT: 32.8 % — ABNORMAL LOW (ref 36.0–46.0)
Hemoglobin: 10.7 g/dL — ABNORMAL LOW (ref 12.0–15.0)
MCH: 30.5 pg (ref 26.0–34.0)
MCHC: 32.6 g/dL (ref 30.0–36.0)
MCV: 93.4 fL (ref 80.0–100.0)
Platelets: 255 10*3/uL (ref 150–400)
RBC: 3.51 MIL/uL — ABNORMAL LOW (ref 3.87–5.11)
RDW: 12.7 % (ref 11.5–15.5)
WBC: 8.3 10*3/uL (ref 4.0–10.5)
nRBC: 0 % (ref 0.0–0.2)

## 2020-11-16 LAB — URINE CULTURE

## 2020-11-16 MED ORDER — ACETAMINOPHEN 325 MG PO TABS
650.0000 mg | ORAL_TABLET | Freq: Four times a day (QID) | ORAL | Status: DC | PRN
  Administered 2020-11-16 – 2020-11-17 (×2): 650 mg via ORAL
  Filled 2020-11-16 (×2): qty 2

## 2020-11-16 MED ORDER — HYDROMORPHONE HCL 1 MG/ML IJ SOLN
INTRAMUSCULAR | Status: AC
  Filled 2020-11-16: qty 1

## 2020-11-16 MED ORDER — LACTATED RINGERS IV SOLN
INTRAVENOUS | Status: DC
  Administered 2020-11-17: 999 mL via INTRAVENOUS

## 2020-11-16 MED ORDER — FLUCONAZOLE 150 MG PO TABS: 150.0000 mg | ORAL_TABLET | Freq: Once | ORAL | 0 refills | Status: AC

## 2020-11-16 MED ORDER — KETOROLAC TROMETHAMINE 30 MG/ML IJ SOLN
INTRAMUSCULAR | Status: AC
  Filled 2020-11-16: qty 1

## 2020-11-16 NOTE — Plan of Care (Signed)
  Problem: Education: Goal: Knowledge of General Education information will improve Description Including pain rating scale, medication(s)/side effects and non-pharmacologic comfort measures Outcome: Progressing   Problem: Health Behavior/Discharge Planning: Goal: Ability to manage health-related needs will improve Outcome: Progressing   Problem: Clinical Measurements: Goal: Ability to maintain clinical measurements within normal limits will improve Outcome: Progressing Goal: Will remain free from infection Outcome: Progressing Goal: Respiratory complications will improve Outcome: Progressing Goal: Cardiovascular complication will be avoided Outcome: Progressing   Problem: Activity: Goal: Risk for activity intolerance will decrease Outcome: Progressing   Problem: Nutrition: Goal: Adequate nutrition will be maintained Outcome: Progressing   Problem: Elimination: Goal: Will not experience complications related to urinary retention Outcome: Progressing   Problem: Pain Managment: Goal: General experience of comfort will improve Outcome: Progressing   Problem: Safety: Goal: Ability to remain free from injury will improve Outcome: Progressing   Problem: Skin Integrity: Goal: Risk for impaired skin integrity will decrease Outcome: Progressing   

## 2020-11-16 NOTE — Progress Notes (Signed)
PROGRESS NOTE    Alicia Gilbert  XAJ:[id number] DOB: Sep 20, 1985 DOA: 11/14/2020 PCP: Dettinger, Elige Radon, MD   Brief Narrative: Alicia Gilbert is a 35 y.o. female with a history of gastritis and obesity. Patient presented secondary to worsening right flank pain for the past two weeks with associated fever. She was found to have a right pyelonephritis. No abscess identified on imaging. Started empirically on Ceftriaxone. Urine and blood cultures are pending.   Assessment & Plan:   Principal Problem:   Pyelonephritis of right kidney Active Problems:   Hypokalemia   Obesity (BMI 30-39.9)   Acute pyelonephritis Right sided. Associated fevers at home. CT evidence of ascending infection. Urinalysis suggests UTI with urine culture and blood culture obtained. Urine culture with multiple species. Blood culture with no growth to date. Started empirically on Ceftriaxone IV. -Continue Ceftriaxone IV -Continue analgesics and IV fluids -Watch for clinical signs of developing abscess -Oxycodone and dilaudid prn for pain -Tylenol prn for fever -Repeat urine culture  Hypokalemia Supplementation given. Resolved.  History of gastritis History of H. Pylori Completed treatment.  Breastfeeding Currently pumping and dumping. Pump ordered to bedside. Plans to restart feeding breast milk when feeling better. Will need recommendations on when safe restart prior to discharge.  Obesity Body mass index is 34.6 kg/m.   DVT prophylaxis: Lovenox Code Status:   Code Status: Full Code Family Communication: None at bedside Disposition Plan: Discharge home likely in 2-5 days pending improvement of infection/pain and transition to oral antibiotics   Consultants:  None  Procedures:  None  Antimicrobials: Ceftriaxone IV    Subjective:  Spanish Interpreter: Rafael [phone]  Continued flank pain. Overall, improving generally but slowly  Objective: Vitals:   11/15/20 2115  11/16/20 0047 11/16/20 0339 11/16/20 0721  BP: 102/67 113/64 (!) 120/51 (!) 96/57  Pulse: 68 60 63 66  Resp: 18 18 18 18   Temp: 99 F (37.2 C) 98.4 F (36.9 C) 99.4 F (37.4 C) (!) 97.3 F (36.3 C)  TempSrc: Oral Oral Oral Oral  SpO2: 99% 99% 98% 98%  Weight:      Height:        Intake/Output Summary (Last 24 hours) at 11/16/2020 0914 Last data filed at 11/16/2020 0729 Gross per 24 hour  Intake 1542.07 ml  Output 601 ml  Net 941.07 ml    Filed Weights   11/14/20 2200  Weight: 85.8 kg    Examination:  General exam: Appears calm and uncomfortable Respiratory system: Clear to auscultation. Respiratory effort normal. Cardiovascular system: S1 & S2 heard, RRR. No murmurs, rubs, gallops or clicks. Gastrointestinal system: Abdomen is nondistended, soft. Right abdominal/flank tenderness. Suprapubic tenderness. No organomegaly or masses felt. Normal bowel sounds heard. Central nervous system: Alert and oriented. No focal neurological deficits. Musculoskeletal: No edema. No calf tenderness Skin: No cyanosis. No rashes Psychiatry: Judgement and insight appear normal. Mood & affect appropriate.    Data Reviewed: I have personally reviewed following labs and imaging studies  CBC Lab Results  Component Value Date   WBC 8.3 11/16/2020   RBC 3.51 (L) 11/16/2020   HGB 10.7 (L) 11/16/2020   HCT 32.8 (L) 11/16/2020   MCV 93.4 11/16/2020   MCH 30.5 11/16/2020   PLT 255 11/16/2020   MCHC 32.6 11/16/2020   RDW 12.7 11/16/2020   LYMPHSABS 1.4 11/06/2020   MONOABS 0.6 11/06/2020   EOSABS 0.0 11/06/2020   BASOSABS 0.0 11/06/2020     Last metabolic panel Lab Results  Component Value Date  NA 134 (L) 11/16/2020   K 4.1 11/16/2020   CL 101 11/16/2020   CO2 25 11/16/2020   BUN 5 (L) 11/16/2020   CREATININE 0.75 11/16/2020   GLUCOSE 98 11/16/2020   GFRNONAA >60 11/16/2020   GFRAA >60 01/13/2020   CALCIUM 8.5 (L) 11/16/2020   PHOS 2.9 11/15/2020   PROT 7.5 11/14/2020    ALBUMIN 3.8 11/14/2020   BILITOT 0.8 11/14/2020   ALKPHOS 72 11/14/2020   AST 17 11/14/2020   ALT 16 11/14/2020   ANIONGAP 8 11/16/2020    CBG (last 3)  No results for input(s): GLUCAP in the last 72 hours.   GFR: Estimated Creatinine Clearance: 100.7 mL/min (by C-G formula based on SCr of 0.75 mg/dL).  Coagulation Profile: No results for input(s): INR, PROTIME in the last 168 hours.  Recent Results (from the past 240 hour(s))  Urine Culture     Status: Abnormal (Preliminary result)   Collection Time: 11/13/20  9:28 AM   Specimen: Urine   UR  Result Value Ref Range Status   Urine Culture, Routine Preliminary report (A)  Preliminary   Organism ID, Bacteria Gram negative rods (A)  Preliminary    Comment: Greater than 100,000 colony forming units per mL   ORGANISM ID, BACTERIA Comment  Preliminary    Comment: Microbiological testing to rule out the presence of possible pathogens is in progress.   Microscopic Examination     Status: Abnormal   Collection Time: 11/13/20  9:28 AM   Urine  Result Value Ref Range Status   WBC, UA 6-10 (A) 0 - 5 /hpf Final   RBC 0-2 0 - 2 /hpf Final   Epithelial Cells (non renal) 0-10 0 - 10 /hpf Final   Bacteria, UA Few None seen/Few Final  Resp Panel by RT-PCR (Flu A&B, Covid) Nasopharyngeal Swab     Status: None   Collection Time: 11/14/20  8:31 PM   Specimen: Nasopharyngeal Swab; Nasopharyngeal(NP) swabs in vial transport medium  Result Value Ref Range Status   SARS Coronavirus 2 by RT PCR NEGATIVE NEGATIVE Final    Comment: (NOTE) SARS-CoV-2 target nucleic acids are NOT DETECTED.  The SARS-CoV-2 RNA is generally detectable in upper respiratory specimens during the acute phase of infection. The lowest concentration of SARS-CoV-2 viral copies this assay can detect is 138 copies/mL. A negative result does not preclude SARS-Cov-2 infection and should not be used as the sole basis for treatment or other patient management decisions. A  negative result may occur with  improper specimen collection/handling, submission of specimen other than nasopharyngeal swab, presence of viral mutation(s) within the areas targeted by this assay, and inadequate number of viral copies(<138 copies/mL). A negative result must be combined with clinical observations, patient history, and epidemiological information. The expected result is Negative.  Fact Sheet for Patients:  BloggerCourse.com  Fact Sheet for Healthcare Providers:  SeriousBroker.it  This test is no t yet approved or cleared by the [place] FDA and  has been authorized for detection and/or diagnosis of SARS-CoV-2 by FDA under an Emergency Use Authorization (EUA). This EUA will remain  in effect (meaning this test can be used) for the duration of the COVID-19 declaration under Section 564(b)(1) of the Act, 21 U.S.C.section 360bbb-3(b)(1), unless the authorization is terminated  or revoked sooner.       Influenza A by PCR NEGATIVE NEGATIVE Final   Influenza B by PCR NEGATIVE NEGATIVE Final    Comment: (NOTE) The Xpert Xpress SARS-CoV-2/FLU/RSV plus assay is  intended as an aid in the diagnosis of influenza from Nasopharyngeal swab specimens and should not be used as a sole basis for treatment. Nasal washings and aspirates are unacceptable for Xpert Xpress SARS-CoV-2/FLU/RSV testing.  Fact Sheet for Patients: BloggerCourse.com  Fact Sheet for Healthcare Providers: SeriousBroker.it  This test is not yet approved or cleared by the [place] FDA and has been authorized for detection and/or diagnosis of SARS-CoV-2 by FDA under an Emergency Use Authorization (EUA). This EUA will remain in effect (meaning this test can be used) for the duration of the COVID-19 declaration under Section 564(b)(1) of the Act, 21 U.S.C. section 360bbb-3(b)(1), unless the authorization  is terminated or revoked.  Performed at [facility], [address] [place], [place] [address]   Culture, blood (routine x 2)     Status: None (Preliminary result)   Collection Time: 11/14/20  9:47 PM   Specimen: BLOOD  Result Value Ref Range Status   Specimen Description BLOOD SITE NOT SPECIFIED  Final   Special Requests   Final    BOTTLES DRAWN AEROBIC AND ANAEROBIC Blood Culture adequate volume   Culture   Final    NO GROWTH 2 DAYS Performed at [facility], [address] [place], [place] [address]    Report Status PENDING  Incomplete  Culture, blood (routine x 2)     Status: None (Preliminary result)   Collection Time: 11/14/20  9:47 PM   Specimen: BLOOD  Result Value Ref Range Status   Specimen Description BLOOD SITE NOT SPECIFIED  Final   Special Requests   Final    BOTTLES DRAWN AEROBIC AND ANAEROBIC Blood Culture adequate volume   Culture   Final    NO GROWTH 2 DAYS Performed at [facility], [address] [place], [place] [address]    Report Status PENDING  Incomplete  Culture, Urine     Status: Abnormal   Collection Time: 11/14/20 11:17 PM   Specimen: Urine, Random  Result Value Ref Range Status   Specimen Description URINE, RANDOM  Final   Special Requests   Final    NONE Performed at [facility], [address], [place], [place] [address]    Culture MULTIPLE SPECIES PRESENT, SUGGEST RECOLLECTION (A)  Final   Report Status 11/16/2020 FINAL  Final        Radiology Studies: CT ABDOMEN PELVIS WO CONTRAST  Result Date: 11/14/2020 CLINICAL DATA:  Right flank/abdominal pain EXAM: CT ABDOMEN AND PELVIS WITHOUT CONTRAST TECHNIQUE: Multidetector CT imaging of the abdomen and pelvis was performed following the standard protocol without IV contrast. COMPARISON:  CT 11/06/2020 FINDINGS: Lower chest: Lung bases are clear. Normal heart size. No pericardial effusion. Hepatobiliary: No visible focal liver lesions. Smooth liver surface contour.  Normal hepatic attenuation. Normal gallbladder and biliary tree. No visible calcified gallstone. Pancreas: No pancreatic ductal dilatation or surrounding inflammatory changes. Spleen: Normal in size. No concerning splenic lesions. Adrenals/Urinary Tract: Normal adrenals. Some asymmetric right perinephric and periureteral stranding with mild urothelial thickening. No visible obstructing urolith. Bladder decompressed with some mild nonspecific wall thickening though faint perivesicular hazy stranding could suggest a superimposed cystitis. No significant stranding or abnormality of the left proximal urinary tract. Stomach/Bowel: Distal esophagus, stomach and duodenal sweep are unremarkable. No small bowel wall thickening or dilatation. No evidence of obstruction. A normal appendix is visualized. No colonic dilatation or wall thickening. Vascular/Lymphatic: No significant vascular findings are present. No enlarged abdominal or pelvic lymph nodes.  Reproductive: Anteverted uterus. Radiopaque IUD in expected positioning. Other: No abdominopelvic free fluid or free gas. No bowel containing hernias. Musculoskeletal: No acute osseous abnormality or suspicious osseous lesion. IMPRESSION: Asymmetric right perinephric, periureteral and perivesicular stranding with some slight urothelial bladder wall thickening. May reflect an ascending tract infection versus a recently passed calculus. No frank hydronephrosis is seen at this time. Hypoattenuating focus in the left interpolar kidney is not well appreciated on this unenhanced CT. No other gross left urinary tract abnormality. Electronically Signed   By: Kreg ShropshirePrice  DeHay M.D.   On: 11/14/2020 20:15        Scheduled Meds:  enoxaparin (LOVENOX) injection  40 mg Subcutaneous Q24H   multivitamin with minerals  1 tablet Oral Daily   pantoprazole  40 mg Oral Daily   senna-docusate  2 tablet Oral QHS   Continuous Infusions:  cefTRIAXone (ROCEPHIN)  IV Stopped (11/15/20 2111)    lactated ringers 100 mL/hr at 11/16/20 16100621     LOS: 2 days     Jacquelin Hawkingalph Jacklynn Dehaas, MD [facility] 11/16/2020, 9:14 AM  If 7PM-7AM, please contact night-coverage [email or web address]

## 2020-11-16 NOTE — Op Note (Signed)
Mailed pt letter informing of results and that a prescription has been sent in.

## 2020-11-16 NOTE — Progress Notes (Signed)
Patient vaginal swab came back positive for yeast, sent Diflucan for her.

## 2020-11-17 ENCOUNTER — Telehealth: Payer: Self-pay | Admitting: Family Medicine

## 2020-11-17 DIAGNOSIS — E669 Obesity, unspecified: Secondary | ICD-10-CM | POA: Diagnosis not present

## 2020-11-17 DIAGNOSIS — N12 Tubulo-interstitial nephritis, not specified as acute or chronic: Secondary | ICD-10-CM | POA: Diagnosis not present

## 2020-11-17 DIAGNOSIS — E876 Hypokalemia: Secondary | ICD-10-CM | POA: Diagnosis not present

## 2020-11-17 LAB — CBC
HCT: 31.4 % — ABNORMAL LOW (ref 36.0–46.0)
Hemoglobin: 10.4 g/dL — ABNORMAL LOW (ref 12.0–15.0)
MCH: 30.3 pg (ref 26.0–34.0)
MCHC: 33.1 g/dL (ref 30.0–36.0)
MCV: 91.5 fL (ref 80.0–100.0)
Platelets: 262 10*3/uL (ref 150–400)
RBC: 3.43 MIL/uL — ABNORMAL LOW (ref 3.87–5.11)
RDW: 12.5 % (ref 11.5–15.5)
WBC: 6.8 10*3/uL (ref 4.0–10.5)
nRBC: 0 % (ref 0.0–0.2)

## 2020-11-17 MED ORDER — DIPHENHYDRAMINE HCL 50 MG/ML IJ SOLN
12.5000 mg | Freq: Once | INTRAMUSCULAR | Status: AC
  Administered 2020-11-18: 12.5 mg via INTRAVENOUS
  Filled 2020-11-17: qty 1

## 2020-11-17 NOTE — Telephone Encounter (Signed)
Dr. Louanne Skye,  I received the positive yeast results yesterday. I did not call the patient, but mailed a letter in Spanish with the results and that you had sent in Diflucan to help.

## 2020-11-17 NOTE — Progress Notes (Signed)
Nausea and emesis episode at 1600, managed with IV prn zofran. Pain managed with prn oxy and prn dilaudid. Family members at bedside today. Translator used for complex conversations, patient able to understand english if simple commands. One assist to [facility] due to increase in dizziness during ambulation.

## 2020-11-17 NOTE — Progress Notes (Signed)
PROGRESS NOTE    Alicia Gilbert  GUY:[id number] DOB: 25-Jan-1986 DOA: 11/14/2020 PCP: Dettinger, Elige Radon, MD   Brief Narrative: Alicia Gilbert is a 35 y.o. female with a history of gastritis and obesity. Patient presented secondary to worsening right flank pain for the past two weeks with associated fever. She was found to have a right pyelonephritis. No abscess identified on imaging. Started empirically on Ceftriaxone. Urine and blood cultures are pending.   Assessment & Plan:   Principal Problem:   Pyelonephritis of right kidney Active Problems:   Hypokalemia   Obesity (BMI 30-39.9)   Acute pyelonephritis Right sided. Associated fevers at home. CT evidence of ascending infection. Urinalysis suggests UTI with urine culture and blood culture obtained. Urine culture (7/9) with multiple species. Blood culture with no growth to date. Started empirically on Ceftriaxone IV. -Continue Ceftriaxone IV 2g -Continue analgesics and IV fluids -Watch for clinical signs of developing abscess -Oxycodone and dilaudid prn for pain -Tylenol prn for fever only -Follow-up repeat urine culture (7/11); if negative, transition to oral antibiotics  Hypokalemia Supplementation given. Resolved.  History of gastritis History of H. Pylori Completed treatment.  Breastfeeding Currently pumping and dumping. Pump ordered to bedside. Plans to restart feeding breast milk when feeling better. Will need recommendations on when safe restart prior to discharge.  Obesity Body mass index is 34.6 kg/m.   DVT prophylaxis: Lovenox Code Status:   Code Status: Full Code Family Communication: None at bedside Disposition Plan: Discharge home likely in 1-3 days pending improvement of infection/pain and transition to oral antibiotics after repeat urine culture   Consultants:  None  Procedures:  None  Antimicrobials: Ceftriaxone IV    Subjective:  [name];  Ephriam Knuckles #[phone]  Continued right abdominal/flank pain. Pain has overall improved. Nausea today without emesis. Ate breakfast.  Objective: Vitals:   11/16/20 2022 11/17/20 0020 11/17/20 0455 11/17/20 0900  BP: 115/74 115/64 104/70 104/69  Pulse: 65 (!) 58 60 61  Resp: 16 16 16 17   Temp: 98.4 F (36.9 C) 98.4 F (36.9 C) 98.7 F (37.1 C) 97.9 F (36.6 C)  TempSrc: Oral Oral Oral Oral  SpO2: 98% 100% 97%   Weight:      Height:        Intake/Output Summary (Last 24 hours) at 11/17/2020 1006 Last data filed at 11/17/2020 0501 Gross per 24 hour  Intake 1485 ml  Output 2200 ml  Net -715 ml    Filed Weights   11/14/20 2200  Weight: 85.8 kg    Examination:  General exam: Appears calm and comfortable Respiratory system: Clear to auscultation. Respiratory effort normal. Cardiovascular system: S1 & S2 heard, RRR. No murmurs, rubs, gallops or clicks. Gastrointestinal system: Abdomen is nondistended, soft and minimally tender (after receiving Dilaudid). No organomegaly or masses felt. Normal bowel sounds heard. Central nervous system: Alert and oriented. No focal neurological deficits. Musculoskeletal: No edema. No calf tenderness Skin: No cyanosis. No rashes Psychiatry: Judgement and insight appear normal. Mood & affect appropriate.    Data Reviewed: I have personally reviewed following labs and imaging studies  CBC Lab Results  Component Value Date   WBC 6.8 11/17/2020   RBC 3.43 (L) 11/17/2020   HGB 10.4 (L) 11/17/2020   HCT 31.4 (L) 11/17/2020   MCV 91.5 11/17/2020   MCH 30.3 11/17/2020   PLT 262 11/17/2020   MCHC 33.1 11/17/2020   RDW 12.5 11/17/2020   LYMPHSABS 1.4 11/06/2020   MONOABS 0.6 11/06/2020  EOSABS 0.0 11/06/2020   BASOSABS 0.0 11/06/2020     Last metabolic panel Lab Results  Component Value Date   NA 134 (L) 11/16/2020   K 4.1 11/16/2020   CL 101 11/16/2020   CO2 25 11/16/2020   BUN 5 (L) 11/16/2020   CREATININE 0.75 11/16/2020   GLUCOSE  98 11/16/2020   GFRNONAA >60 11/16/2020   GFRAA >60 01/13/2020   CALCIUM 8.5 (L) 11/16/2020   PHOS 2.9 11/15/2020   PROT 7.5 11/14/2020   ALBUMIN 3.8 11/14/2020   BILITOT 0.8 11/14/2020   ALKPHOS 72 11/14/2020   AST 17 11/14/2020   ALT 16 11/14/2020   ANIONGAP 8 11/16/2020    CBG (last 3)  No results for input(s): GLUCAP in the last 72 hours.   GFR: Estimated Creatinine Clearance: 100.7 mL/min (by C-G formula based on SCr of 0.75 mg/dL).  Coagulation Profile: No results for input(s): INR, PROTIME in the last 168 hours.  Recent Results (from the past 240 hour(s))  Urine Culture     Status: Abnormal (Preliminary result)   Collection Time: 11/13/20  9:28 AM   Specimen: Urine   UR  Result Value Ref Range Status   Urine Culture, Routine Preliminary report (A)  Preliminary   Organism ID, Bacteria Gram negative rods (A)  Preliminary    Comment: Greater than 100,000 colony forming units per mL   ORGANISM ID, BACTERIA Comment  Preliminary    Comment: Microbiological testing to rule out the presence of possible pathogens is in progress.   Microscopic Examination     Status: Abnormal   Collection Time: 11/13/20  9:28 AM   Urine  Result Value Ref Range Status   WBC, UA 6-10 (A) 0 - 5 /hpf Final   RBC 0-2 0 - 2 /hpf Final   Epithelial Cells (non renal) 0-10 0 - 10 /hpf Final   Bacteria, UA Few None seen/Few Final  Resp Panel by RT-PCR (Flu A&B, Covid) Nasopharyngeal Swab     Status: None   Collection Time: 11/14/20  8:31 PM   Specimen: Nasopharyngeal Swab; Nasopharyngeal(NP) swabs in vial transport medium  Result Value Ref Range Status   SARS Coronavirus 2 by RT PCR NEGATIVE NEGATIVE Final    Comment: (NOTE) SARS-CoV-2 target nucleic acids are NOT DETECTED.  The SARS-CoV-2 RNA is generally detectable in upper respiratory specimens during the acute phase of infection. The lowest concentration of SARS-CoV-2 viral copies this assay can detect is 138 copies/mL. A negative result  does not preclude SARS-Cov-2 infection and should not be used as the sole basis for treatment or other patient management decisions. A negative result may occur with  improper specimen collection/handling, submission of specimen other than nasopharyngeal swab, presence of viral mutation(s) within the areas targeted by this assay, and inadequate number of viral copies(<138 copies/mL). A negative result must be combined with clinical observations, patient history, and epidemiological information. The expected result is Negative.  Fact Sheet for Patients:  BloggerCourse.com  Fact Sheet for Healthcare Providers:  SeriousBroker.it  This test is no t yet approved or cleared by the [place] FDA and  has been authorized for detection and/or diagnosis of SARS-CoV-2 by FDA under an Emergency Use Authorization (EUA). This EUA will remain  in effect (meaning this test can be used) for the duration of the COVID-19 declaration under Section 564(b)(1) of the Act, 21 U.S.C.section 360bbb-3(b)(1), unless the authorization is terminated  or revoked sooner.       Influenza A by PCR NEGATIVE  NEGATIVE Final   Influenza B by PCR NEGATIVE NEGATIVE Final    Comment: (NOTE) The Xpert Xpress SARS-CoV-2/FLU/RSV plus assay is intended as an aid in the diagnosis of influenza from Nasopharyngeal swab specimens and should not be used as a sole basis for treatment. Nasal washings and aspirates are unacceptable for Xpert Xpress SARS-CoV-2/FLU/RSV testing.  Fact Sheet for Patients: BloggerCourse.com  Fact Sheet for Healthcare Providers: SeriousBroker.it  This test is not yet approved or cleared by the [place] FDA and has been authorized for detection and/or diagnosis of SARS-CoV-2 by FDA under an Emergency Use Authorization (EUA). This EUA will remain in effect (meaning this test can be used) for  the duration of the COVID-19 declaration under Section 564(b)(1) of the Act, 21 U.S.C. section 360bbb-3(b)(1), unless the authorization is terminated or revoked.  Performed at [facility], [address], [place], [place] [address]   Culture, blood (routine x 2)     Status: None (Preliminary result)   Collection Time: 11/14/20  9:47 PM   Specimen: BLOOD  Result Value Ref Range Status   Specimen Description BLOOD SITE NOT SPECIFIED  Final   Special Requests   Final    BOTTLES DRAWN AEROBIC AND ANAEROBIC Blood Culture adequate volume   Culture   Final    NO GROWTH 3 DAYS Performed at [facility], [address] [place], [place] [address]    Report Status PENDING  Incomplete  Culture, blood (routine x 2)     Status: None (Preliminary result)   Collection Time: 11/14/20  9:47 PM   Specimen: BLOOD  Result Value Ref Range Status   Specimen Description BLOOD SITE NOT SPECIFIED  Final   Special Requests   Final    BOTTLES DRAWN AEROBIC AND ANAEROBIC Blood Culture adequate volume   Culture   Final    NO GROWTH 3 DAYS Performed at [facility], [address] [place], [place] [address]    Report Status PENDING  Incomplete  Culture, Urine     Status: Abnormal   Collection Time: 11/14/20 11:17 PM   Specimen: Urine, Random  Result Value Ref Range Status   Specimen Description URINE, RANDOM  Final   Special Requests   Final    NONE Performed at [facility], [address], [place], [place] [address]    Culture MULTIPLE SPECIES PRESENT, SUGGEST RECOLLECTION (A)  Final   Report Status 11/16/2020 FINAL  Final        Radiology Studies: No results found.      Scheduled Meds:  enoxaparin (LOVENOX) injection  40 mg Subcutaneous Q24H   multivitamin with minerals  1 tablet Oral Daily   pantoprazole  40 mg Oral Daily   senna-docusate  2 tablet Oral QHS   Continuous Infusions:  cefTRIAXone (ROCEPHIN)  IV 200 mL/hr at 11/17/20 0501   lactated ringers  100 mL/hr at 11/16/20 2100     LOS: 3 days     Jacquelin Hawking, MD [facility] 11/17/2020, 10:06 AM  If 7PM-7AM, please contact night-coverage [email or web address]

## 2020-11-18 DIAGNOSIS — E876 Hypokalemia: Secondary | ICD-10-CM | POA: Diagnosis not present

## 2020-11-18 DIAGNOSIS — N12 Tubulo-interstitial nephritis, not specified as acute or chronic: Secondary | ICD-10-CM | POA: Diagnosis not present

## 2020-11-18 LAB — URINE CULTURE: Culture: NO GROWTH

## 2020-11-18 NOTE — Progress Notes (Signed)
Pt c/o itching all over and felt like tongue swelling. No SOB noted. Pt stated she ate cantaloupe and that's the first time it happen to her. Notified on call Nelia Shi with new order benadryl 12.5mg  IV x1.

## 2020-11-18 NOTE — Telephone Encounter (Signed)
You can just let her know that the results have not come back before the weekend and would like that on Monday and that is why they were called and the medicine was sent on Monday.  Please have her follow-up with [place] after she is on the hospital.

## 2020-11-18 NOTE — Progress Notes (Signed)
[facility] PROGRESS NOTE    Progress Note  Alicia Gilbert  CWC:[id number] DOB: [birth year] DOA: 11/14/2020 PCP: Dettinger, Elige Radon, MD     Brief Narrative:   Alicia Gilbert is an 35 y.o. female with a history of gastritis comes in with worsening right flank pain associated with fever diagnosed with acute pyelonephritis    Assessment/Plan:   Pyelonephritis of right kidney: Has remained afebrile and leukocytosis. Currently on IV Rocephin. Pain seems to be hard to control continue analgesics. Will de-escalate to oral Omnicef Blood cultures have remained negative till date, initial urine cultures show E. coli no sensitivities available.  Hypokalemia Pleated orally now resolved  Obesity (BMI 30-39.9) Counseling.  Breast-feeding: Currently pumping and dumping plans to restart feeding when she feels better.   DVT prophylaxis: lovenox Family Communication:none Status is: Inpatient  Remains inpatient appropriate because:Hemodynamically unstable  Dispo:  Patient From: Home  Planned Disposition: Home  Medically stable for discharge: No     Code Status:     Code Status Orders  (From admission, onward)           Start     Ordered   11/14/20 2047  Full code  Continuous        11/14/20 2047           Code Status History     This patient has a current code status but no historical code status.         IV Access:   Peripheral IV   Procedures and diagnostic studies:   No results found.   Medical Consultants:   None.   Subjective:    Alicia Gilbert still complaining of pain and nauseated.  Objective:    Vitals:   11/17/20 2024 11/18/20 0007 11/18/20 0440 11/18/20 0840  BP: 103/82 127/73 127/88 113/70  Pulse: (!) 56 (!) 57 60 (!) 58  Resp: 16 17 17 18   Temp: 98 F (36.7 C) 97.9 F (36.6 C) 97.9 F (36.6 C) 98.5 F (36.9 C)  TempSrc: Oral Oral Oral Oral  SpO2: 98% 99% 98% 97%  Weight:      Height:        SpO2: 97 %   Intake/Output Summary (Last 24 hours) at 11/18/2020 1022 Last data filed at 11/18/2020 0841 Gross per 24 hour  Intake 480 ml  Output 80 ml  Net 400 ml   Filed Weights   11/14/20 2200  Weight: 85.8 kg    Exam: General exam: In no acute distress. Respiratory system: Good air movement and clear to auscultation. Cardiovascular system: S1 & S2 heard, RRR. No JVD. Gastrointestinal system: Abdomen is nondistended, soft and nontender.  Extremities: No pedal edema.  Data Reviewed:    Labs: Basic Metabolic Panel: Recent Labs  Lab 11/14/20 1715 11/15/20 0118 11/16/20 0115  NA 135 134* 134*  K 3.1* 3.7 4.1  CL 104 104 101  CO2 22 24 25   GLUCOSE 124* 160* 98  BUN 11 8 5*  CREATININE 0.72 0.65 0.75  CALCIUM 8.8* 8.0* 8.5*  MG  --  1.9  --   PHOS  --  2.9  --    GFR Estimated Creatinine Clearance: 100.7 mL/min (by C-G formula based on SCr of 0.75 mg/dL). Liver Function Tests: Recent Labs  Lab 11/14/20 1715  AST 17  ALT 16  ALKPHOS 72  BILITOT 0.8  PROT 7.5  ALBUMIN 3.8   Recent Labs  Lab 11/14/20 1715  LIPASE 25   No results for  input(s): AMMONIA in the last 168 hours. Coagulation profile No results for input(s): INR, PROTIME in the last 168 hours. COVID-19 Labs  No results for input(s): DDIMER, FERRITIN, LDH, CRP in the last 72 hours.  Lab Results  Component Value Date   SARSCOV2NAA NEGATIVE 11/14/2020   SARSCOV2NAA NEGATIVE 09/25/2020   SARSCOV2NAA NEGATIVE 01/13/2020    CBC: Recent Labs  Lab 11/14/20 1715 11/15/20 0118 11/16/20 0115 11/17/20 0147  WBC 11.1* 11.1* 8.3 6.8  HGB 12.6 11.0* 10.7* 10.4*  HCT 37.5 32.9* 32.8* 31.4*  MCV 91.2 91.1 93.4 91.5  PLT 310 254 255 262   Cardiac Enzymes: No results for input(s): CKTOTAL, CKMB, CKMBINDEX, TROPONINI in the last 168 hours. BNP (last 3 results) No results for input(s): PROBNP in the last 8760 hours. CBG: No results for input(s): GLUCAP in the last 168 hours. D-Dimer: No  results for input(s): DDIMER in the last 72 hours. Hgb A1c: No results for input(s): HGBA1C in the last 72 hours. Lipid Profile: No results for input(s): CHOL, HDL, LDLCALC, TRIG, CHOLHDL, LDLDIRECT in the last 72 hours. Thyroid function studies: No results for input(s): TSH, T4TOTAL, T3FREE, THYROIDAB in the last 72 hours.  Invalid input(s): FREET3 Anemia work up: No results for input(s): VITAMINB12, FOLATE, FERRITIN, TIBC, IRON, RETICCTPCT in the last 72 hours. Sepsis Labs: Recent Labs  Lab 11/14/20 1715 11/15/20 0118 11/16/20 0115 11/17/20 0147  WBC 11.1* 11.1* 8.3 6.8   Microbiology Recent Results (from the past 240 hour(s))  Urine Culture     Status: Abnormal (Preliminary result)   Collection Time: 11/13/20  9:28 AM   Specimen: Urine   UR  Result Value Ref Range Status   Urine Culture, Routine Preliminary report (A)  Preliminary   Organism ID, Bacteria Escherichia coli (A)  Preliminary    Comment: Greater than 100,000 colony forming units per mL   ORGANISM ID, BACTERIA Not applicable  Preliminary  Microscopic Examination     Status: Abnormal   Collection Time: 11/13/20  9:28 AM   Urine  Result Value Ref Range Status   WBC, UA 6-10 (A) 0 - 5 /hpf Final   RBC 0-2 0 - 2 /hpf Final   Epithelial Cells (non renal) 0-10 0 - 10 /hpf Final   Bacteria, UA Few None seen/Few Final  Resp Panel by RT-PCR (Flu A&B, Covid) Nasopharyngeal Swab     Status: None   Collection Time: 11/14/20  8:31 PM   Specimen: Nasopharyngeal Swab; Nasopharyngeal(NP) swabs in vial transport medium  Result Value Ref Range Status   SARS Coronavirus 2 by RT PCR NEGATIVE NEGATIVE Final    Comment: (NOTE) SARS-CoV-2 target nucleic acids are NOT DETECTED.  The SARS-CoV-2 RNA is generally detectable in upper respiratory specimens during the acute phase of infection. The lowest concentration of SARS-CoV-2 viral copies this assay can detect is 138 copies/mL. A negative result does not preclude  SARS-Cov-2 infection and should not be used as the sole basis for treatment or other patient management decisions. A negative result may occur with  improper specimen collection/handling, submission of specimen other than nasopharyngeal swab, presence of viral mutation(s) within the areas targeted by this assay, and inadequate number of viral copies(<138 copies/mL). A negative result must be combined with clinical observations, patient history, and epidemiological information. The expected result is Negative.  Fact Sheet for Patients:  BloggerCourse.comhttps://www.fda.gov/media/152166/download  Fact Sheet for Healthcare Providers:  SeriousBroker.ithttps://www.fda.gov/media/152162/download  This test is no t yet approved or cleared by the [place] FDA and  has been authorized for detection and/or diagnosis of SARS-CoV-2 by FDA under an Emergency Use Authorization (EUA). This EUA will remain  in effect (meaning this test can be used) for the duration of the COVID-19 declaration under Section 564(b)(1) of the Act, 21 U.S.C.section 360bbb-3(b)(1), unless the authorization is terminated  or revoked sooner.       Influenza A by PCR NEGATIVE NEGATIVE Final   Influenza B by PCR NEGATIVE NEGATIVE Final    Comment: (NOTE) The Xpert Xpress SARS-CoV-2/FLU/RSV plus assay is intended as an aid in the diagnosis of influenza from Nasopharyngeal swab specimens and should not be used as a sole basis for treatment. Nasal washings and aspirates are unacceptable for Xpert Xpress SARS-CoV-2/FLU/RSV testing.  Fact Sheet for Patients: BloggerCourse.com  Fact Sheet for Healthcare Providers: SeriousBroker.it  This test is not yet approved or cleared by the [place] FDA and has been authorized for detection and/or diagnosis of SARS-CoV-2 by FDA under an Emergency Use Authorization (EUA). This EUA will remain in effect (meaning this test can be used) for the duration of  the COVID-19 declaration under Section 564(b)(1) of the Act, 21 U.S.C. section 360bbb-3(b)(1), unless the authorization is terminated or revoked.  Performed at [facility], [address], [place], [place] [address]   Culture, blood (routine x 2)     Status: None (Preliminary result)   Collection Time: 11/14/20  9:47 PM   Specimen: BLOOD  Result Value Ref Range Status   Specimen Description BLOOD SITE NOT SPECIFIED  Final   Special Requests   Final    BOTTLES DRAWN AEROBIC AND ANAEROBIC Blood Culture adequate volume   Culture   Final    NO GROWTH 4 DAYS Performed at [facility], [address] [place], [place] [address]    Report Status PENDING  Incomplete  Culture, blood (routine x 2)     Status: None (Preliminary result)   Collection Time: 11/14/20  9:47 PM   Specimen: BLOOD  Result Value Ref Range Status   Specimen Description BLOOD SITE NOT SPECIFIED  Final   Special Requests   Final    BOTTLES DRAWN AEROBIC AND ANAEROBIC Blood Culture adequate volume   Culture   Final    NO GROWTH 4 DAYS Performed at [facility], [address] [place], [place] [address]    Report Status PENDING  Incomplete  Culture, Urine     Status: Abnormal   Collection Time: 11/14/20 11:17 PM   Specimen: Urine, Random  Result Value Ref Range Status   Specimen Description URINE, RANDOM  Final   Special Requests   Final    NONE Performed at [facility], [address] [place], [place] [address]    Culture MULTIPLE SPECIES PRESENT, SUGGEST RECOLLECTION (A)  Final   Report Status 11/16/2020 FINAL  Final  Culture, Urine     Status: None   Collection Time: 11/16/20  2:37 PM   Specimen: Urine, Clean Catch  Result Value Ref Range Status   Specimen Description URINE, CLEAN CATCH  Final   Special Requests NONE  Final   Culture   Final    NO GROWTH Performed at [facility], [address] [place], [place] [address]    Report Status 11/18/2020 FINAL  Final      Medications:    enoxaparin (LOVENOX) injection  40 mg Subcutaneous Q24H   multivitamin with minerals  1 tablet Oral Daily   pantoprazole  40  mg Oral Daily   senna-docusate  2 tablet Oral QHS   Continuous Infusions:  cefTRIAXone (ROCEPHIN)  IV 2 g (11/17/20 2004)   lactated ringers 999 mL (11/17/20 1633)      LOS: 4 days   Marinda Elk  [facility]  11/18/2020, 10:22 AM

## 2020-11-19 ENCOUNTER — Telehealth: Payer: Self-pay | Admitting: Family Medicine

## 2020-11-19 DIAGNOSIS — E876 Hypokalemia: Secondary | ICD-10-CM | POA: Diagnosis not present

## 2020-11-19 DIAGNOSIS — N12 Tubulo-interstitial nephritis, not specified as acute or chronic: Secondary | ICD-10-CM | POA: Diagnosis not present

## 2020-11-19 LAB — CULTURE, BLOOD (ROUTINE X 2)
Culture: NO GROWTH
Culture: NO GROWTH
Special Requests: ADEQUATE
Special Requests: ADEQUATE

## 2020-11-19 MED ORDER — CEFDINIR 300 MG PO CAPS: 300.0000 mg | ORAL_CAPSULE | Freq: Two times a day (BID) | ORAL | 0 refills | Status: AC

## 2020-11-19 MED ORDER — TRAMADOL HCL 50 MG PO TABS: 50.0000 mg | ORAL_TABLET | Freq: Four times a day (QID) | ORAL | 0 refills | Status: AC | PRN

## 2020-11-19 NOTE — Progress Notes (Signed)
Pixie Burgener to be D/C'd  per MD order.  Discussed with the patient and all questions fully answered.  VSS, Skin clean, dry and intact without evidence of skin break down, no evidence of skin tears noted.  IV catheter discontinued intact. Site without signs and symptoms of complications. Dressing and pressure applied.  An After Visit Summary was printed and given to the patient. Use interpreter Elita Quick [phone] to give instruction.  D/c education completed with patient/family including follow up instructions, medication list, d/c activities limitations if indicated, with other d/c instructions as indicated by MD - patient able to verbalize understanding, all questions fully answered.   Patient instructed to return to ED, call 911, or call MD for any changes in condition.   Patient to be escorted via WC, and D/C home via private auto.

## 2020-11-19 NOTE — Telephone Encounter (Signed)
1st attempt to schedule hospital f/u. Patient did not answer. [employer] interpreter left message on voicemail to call back clinic to schedule appointment

## 2020-11-19 NOTE — Discharge Summary (Signed)
Physician Discharge Summary  Alicia Gilbert RWE:[id number] DOB: [birth year] DOA: 11/14/2020  PCP: Dettinger, Elige Radon, MD  Admit date: 11/14/2020 Discharge date: 11/19/2020  Admitted From: Home Disposition:  home  Recommendations for Outpatient Follow-up:  Follow up with PCP in 1-2 weeks   Home Health:no Equipment/Devices:None  Discharge Condition:Stable CODE STATUS:Full Diet recommendation: Heart Healthy  Brief/Interim Summary: 35 y.o. female with a history of gastritis comes in with worsening right flank pain associated with fever diagnosed with acute pyelonephritis    Discharge Diagnoses:  Principal Problem:   Pyelonephritis of right kidney Active Problems:   Hypokalemia   Obesity (BMI 30-39.9)  Acute pyelonephritis of the left kidney: She was on IV Rocephin her fever resolved, urine culture grew E. coli sensitive to cephalosporins. She was transitioned to oral Omnicef which will continue as an outpatient blood cultures remain negative to date.  Hypokalemia: Replete orally now resolved.    Discharge Instructions  Discharge Instructions     Diet - low sodium heart healthy   Complete by: As directed    Increase activity slowly   Complete by: As directed       Allergies as of 11/19/2020       Reactions   Penicillins Hives, Rash   Reaction: Childhood   Shellfish Allergy Hives, Swelling        Medication List     TAKE these medications    cefdinir 300 MG capsule Commonly known as: OMNICEF Take 1 capsule (300 mg total) by mouth 2 (two) times daily for 7 days.   IBUPROFEN IB PO Take 1 tablet by mouth daily as needed (pain).   multivitamin with minerals tablet Take 1 tablet by mouth daily. Woman's   Pylera 140-125-125 MG capsule Generic drug: bismuth-metronidazole-tetracycline Take 3 capsules by mouth 4 (four) times daily -  before meals and at bedtime for 21 days. What changed: when to take this   traMADol 50 MG tablet Commonly known as:  Ultram Take 1 tablet (50 mg total) by mouth every 6 (six) hours as needed for up to 5 days.        Allergies  Allergen Reactions   Penicillins Hives and Rash    Reaction: Childhood   Shellfish Allergy Hives and Swelling    Consultations: None  Procedures/Studies: CT ABDOMEN PELVIS WO CONTRAST  Result Date: 11/14/2020 CLINICAL DATA:  Right flank/abdominal pain EXAM: CT ABDOMEN AND PELVIS WITHOUT CONTRAST TECHNIQUE: Multidetector CT imaging of the abdomen and pelvis was performed following the standard protocol without IV contrast. COMPARISON:  CT 11/06/2020 FINDINGS: Lower chest: Lung bases are clear. Normal heart size. No pericardial effusion. Hepatobiliary: No visible focal liver lesions. Smooth liver surface contour. Normal hepatic attenuation. Normal gallbladder and biliary tree. No visible calcified gallstone. Pancreas: No pancreatic ductal dilatation or surrounding inflammatory changes. Spleen: Normal in size. No concerning splenic lesions. Adrenals/Urinary Tract: Normal adrenals. Some asymmetric right perinephric and periureteral stranding with mild urothelial thickening. No visible obstructing urolith. Bladder decompressed with some mild nonspecific wall thickening though faint perivesicular hazy stranding could suggest a superimposed cystitis. No significant stranding or abnormality of the left proximal urinary tract. Stomach/Bowel: Distal esophagus, stomach and duodenal sweep are unremarkable. No small bowel wall thickening or dilatation. No evidence of obstruction. A normal appendix is visualized. No colonic dilatation or wall thickening. Vascular/Lymphatic: No significant vascular findings are present. No enlarged abdominal or pelvic lymph nodes. Reproductive: Anteverted uterus. Radiopaque IUD in expected positioning. Other: No abdominopelvic free fluid or free gas. No bowel containing  hernias. Musculoskeletal: No acute osseous abnormality or suspicious osseous lesion. IMPRESSION:  Asymmetric right perinephric, periureteral and perivesicular stranding with some slight urothelial bladder wall thickening. May reflect an ascending tract infection versus a recently passed calculus. No frank hydronephrosis is seen at this time. Hypoattenuating focus in the left interpolar kidney is not well appreciated on this unenhanced CT. No other gross left urinary tract abnormality. Electronically Signed   By: Kreg Shropshire M.D.   On: 11/14/2020 20:15   CT ABDOMEN PELVIS W CONTRAST  Result Date: 11/06/2020 CLINICAL DATA:  Right lower back and abdominal pain EXAM: CT ABDOMEN AND PELVIS WITH CONTRAST TECHNIQUE: Multidetector CT imaging of the abdomen and pelvis was performed using the standard protocol following bolus administration of intravenous contrast. CONTRAST:  OMNIPAQUE IOHEXOL 300 MG/ML  SOLN COMPARISON:  Ultrasound 08/19/2020 FINDINGS: Lower chest: Lung bases are clear. Normal heart size. No pericardial effusion. Hepatobiliary: No worrisome focal liver lesions. Smooth liver surface contour. Normal hepatic attenuation. Normal gallbladder and biliary tree. Pancreas: No pancreatic ductal dilatation or surrounding inflammatory changes. Spleen: Normal in size. No concerning splenic lesions. Adrenals/Urinary Tract: Normal adrenals. Kidneys are normally located. Ill-defined hypoattenuating focus in the left interpolar kidney measuring approximately 14 mm in size, incompletely characterized. No significant perinephric stranding. No other concerning focal renal lesion. No urolithiasis or hydronephrosis. Bladder decompressed. Bladder wall thickening with some mucosal hyperemia, some of which could be related to underdistention though should correlate with urinalysis particularly given the findings in the left kidney. Stomach/Bowel: Distal esophagus, stomach and duodenum are unremarkable. Normal air-filled appendix in retrocecal position. No colonic dilatation or wall thickening. Vascular/Lymphatic: No  significant vascular findings are present. No enlarged abdominal or pelvic lymph nodes. Reproductive: Anteverted uterus. Grossly normal positioning of a radiodense IUD. No concerning uterine mass. No worrisome adnexal lesions. Other: Mild posterior body wall edema. No free abdominopelvic air or fluid. No bowel containing hernia. Musculoskeletal: Levocurvature lumbar spine, apex L3. No acute osseous abnormality or suspicious osseous lesion. Musculature is normal and symmetric. IMPRESSION: Circumferential thickening urinary bladder, somewhat greater than expected for underdistention with additional ill-defined region of hypoattenuation in the interpolar left kidney. Contralateral to the indicated site of patient discomfort. Recommend correlation with urinalysis to exclude cystitis and ascending tract infection. Could also consider renal ultrasound for further interrogation of this ill-defined region of hypoattenuation incompletely characterized on the CT images. No other acute or worrisome CT abnormality to provide cause for patient's right flank and abdominal pain. Electronically Signed   By: Kreg Shropshire M.D.   On: 11/06/2020 18:44   (Echo, Carotid, EGD, Colonoscopy, ERCP)    Subjective: No complaints.  Discharge Exam: Vitals:   11/18/20 2357 11/19/20 0452  BP: 114/66 124/77  Pulse: 60 (!) 49  Resp: 16 17  Temp: 98 F (36.7 C) 98.1 F (36.7 C)  SpO2: 97% 99%   Vitals:   11/18/20 1832 11/18/20 2047 11/18/20 2357 11/19/20 0452  BP: 119/77 126/78 114/66 124/77  Pulse: 62 (!) 53 60 (!) 49  Resp: 17 17 16 17   Temp: 97.8 F (36.6 C) 97.7 F (36.5 C) 98 F (36.7 C) 98.1 F (36.7 C)  TempSrc: Oral Oral Oral Oral  SpO2: 98% 96% 97% 99%  Weight:      Height:        General: Pt is alert, awake, not in acute distress Cardiovascular: RRR, S1/S2 +, no rubs, no gallops Respiratory: CTA bilaterally, no wheezing, no rhonchi Abdominal: Soft, NT, ND, bowel sounds + Extremities: no edema,  no  cyanosis    The results of significant diagnostics from this hospitalization (including imaging, microbiology, ancillary and laboratory) are listed below for reference.     Microbiology: Recent Results (from the past 240 hour(s))  Urine Culture     Status: Abnormal   Collection Time: 11/13/20  9:28 AM   Specimen: Urine   UR  Result Value Ref Range Status   Urine Culture, Routine Final report (A)  Final   Organism ID, Bacteria Escherichia coli (A)  Final    Comment: Cefazolin <=4 ug/mL Cefazolin with an MIC <=16 predicts susceptibility to the oral agents cefaclor, cefdinir, cefpodoxime, cefprozil, cefuroxime, cephalexin, and loracarbef when used for therapy of uncomplicated urinary tract infections due to E. coli, Klebsiella pneumoniae, and Proteus mirabilis. Greater than 100,000 colony forming units per mL    ORGANISM ID, BACTERIA Not applicable  Final   Antimicrobial Susceptibility Comment  Final    Comment:       ** S = Susceptible; I = Intermediate; R = Resistant **                    P = Positive; N = Negative             MICS are expressed in micrograms per mL    Antibiotic                 RSLT#1    RSLT#2    RSLT#3    RSLT#4 Amoxicillin/Clavulanic Acid    S Ampicillin                     S Cefepime                       S Ceftriaxone                    S Cefuroxime                     S Ciprofloxacin                  R Ertapenem                      S Gentamicin                     S Imipenem                       S Levofloxacin                   R Meropenem                      S Nitrofurantoin                 S Piperacillin/Tazobactam        S Tetracycline                   R Tobramycin                     S Trimethoprim/Sulfa             S   Microscopic Examination     Status: Abnormal   Collection Time: 11/13/20  9:28 AM   Urine  Result Value Ref Range Status   WBC, UA 6-10 (A) 0 - 5 /hpf  Final   RBC 0-2 0 - 2 /hpf Final   Epithelial Cells (non renal)  0-10 0 - 10 /hpf Final   Bacteria, UA Few None seen/Few Final  Resp Panel by RT-PCR (Flu A&B, Covid) Nasopharyngeal Swab     Status: None   Collection Time: 11/14/20  8:31 PM   Specimen: Nasopharyngeal Swab; Nasopharyngeal(NP) swabs in vial transport medium  Result Value Ref Range Status   SARS Coronavirus 2 by RT PCR NEGATIVE NEGATIVE Final    Comment: (NOTE) SARS-CoV-2 target nucleic acids are NOT DETECTED.  The SARS-CoV-2 RNA is generally detectable in upper respiratory specimens during the acute phase of infection. The lowest concentration of SARS-CoV-2 viral copies this assay can detect is 138 copies/mL. A negative result does not preclude SARS-Cov-2 infection and should not be used as the sole basis for treatment or other patient management decisions. A negative result may occur with  improper specimen collection/handling, submission of specimen other than nasopharyngeal swab, presence of viral mutation(s) within the areas targeted by this assay, and inadequate number of viral copies(<138 copies/mL). A negative result must be combined with clinical observations, patient history, and epidemiological information. The expected result is Negative.  Fact Sheet for Patients:  BloggerCourse.com  Fact Sheet for Healthcare Providers:  SeriousBroker.it  This test is no t yet approved or cleared by the [place] FDA and  has been authorized for detection and/or diagnosis of SARS-CoV-2 by FDA under an Emergency Use Authorization (EUA). This EUA will remain  in effect (meaning this test can be used) for the duration of the COVID-19 declaration under Section 564(b)(1) of the Act, 21 U.S.C.section 360bbb-3(b)(1), unless the authorization is terminated  or revoked sooner.       Influenza A by PCR NEGATIVE NEGATIVE Final   Influenza B by PCR NEGATIVE NEGATIVE Final    Comment: (NOTE) The Xpert Xpress SARS-CoV-2/FLU/RSV plus assay is  intended as an aid in the diagnosis of influenza from Nasopharyngeal swab specimens and should not be used as a sole basis for treatment. Nasal washings and aspirates are unacceptable for Xpert Xpress SARS-CoV-2/FLU/RSV testing.  Fact Sheet for Patients: BloggerCourse.com  Fact Sheet for Healthcare Providers: SeriousBroker.it  This test is not yet approved or cleared by the [place] FDA and has been authorized for detection and/or diagnosis of SARS-CoV-2 by FDA under an Emergency Use Authorization (EUA). This EUA will remain in effect (meaning this test can be used) for the duration of the COVID-19 declaration under Section 564(b)(1) of the Act, 21 U.S.C. section 360bbb-3(b)(1), unless the authorization is terminated or revoked.  Performed at [facility], [address] [place], [place] [address]   Culture, blood (routine x 2)     Status: None   Collection Time: 11/14/20  9:47 PM   Specimen: BLOOD  Result Value Ref Range Status   Specimen Description BLOOD SITE NOT SPECIFIED  Final   Special Requests   Final    BOTTLES DRAWN AEROBIC AND ANAEROBIC Blood Culture adequate volume   Culture   Final    NO GROWTH 5 DAYS Performed at [facility], [address] [place], [place] [address]    Report Status 11/19/2020 FINAL  Final  Culture, blood (routine x 2)     Status: None   Collection Time: 11/14/20  9:47 PM   Specimen: BLOOD  Result Value Ref Range Status   Specimen Description BLOOD SITE NOT SPECIFIED  Final   Special Requests   Final  BOTTLES DRAWN AEROBIC AND ANAEROBIC Blood Culture adequate volume   Culture   Final    NO GROWTH 5 DAYS Performed at [facility], [address], [place], [place] [address]    Report Status 11/19/2020 FINAL  Final  Culture, Urine     Status: Abnormal   Collection Time: 11/14/20 11:17 PM   Specimen: Urine, Random  Result Value Ref Range Status   Specimen  Description URINE, RANDOM  Final   Special Requests   Final    NONE Performed at [facility], [address] [place], [place] [address]    Culture MULTIPLE SPECIES PRESENT, SUGGEST RECOLLECTION (A)  Final   Report Status 11/16/2020 FINAL  Final  Culture, Urine     Status: None   Collection Time: 11/16/20  2:37 PM   Specimen: Urine, Clean Catch  Result Value Ref Range Status   Specimen Description URINE, CLEAN CATCH  Final   Special Requests NONE  Final   Culture   Final    NO GROWTH Performed at [facility], [address], [place], [place] [address]    Report Status 11/18/2020 FINAL  Final     Labs: BNP (last 3 results) Recent Labs    07/14/20 1020  BNP 57.0   Basic Metabolic Panel: Recent Labs  Lab 11/14/20 1715 11/15/20 0118 11/16/20 0115  NA 135 134* 134*  K 3.1* 3.7 4.1  CL 104 104 101  CO2 22 24 25   GLUCOSE 124* 160* 98  BUN 11 8 5*  CREATININE 0.72 0.65 0.75  CALCIUM 8.8* 8.0* 8.5*  MG  --  1.9  --   PHOS  --  2.9  --    Liver Function Tests: Recent Labs  Lab 11/14/20 1715  AST 17  ALT 16  ALKPHOS 72  BILITOT 0.8  PROT 7.5  ALBUMIN 3.8   Recent Labs  Lab 11/14/20 1715  LIPASE 25   No results for input(s): AMMONIA in the last 168 hours. CBC: Recent Labs  Lab 11/14/20 1715 11/15/20 0118 11/16/20 0115 11/17/20 0147  WBC 11.1* 11.1* 8.3 6.8  HGB 12.6 11.0* 10.7* 10.4*  HCT 37.5 32.9* 32.8* 31.4*  MCV 91.2 91.1 93.4 91.5  PLT 310 254 255 262   Cardiac Enzymes: No results for input(s): CKTOTAL, CKMB, CKMBINDEX, TROPONINI in the last 168 hours. BNP: Invalid input(s): POCBNP CBG: No results for input(s): GLUCAP in the last 168 hours. D-Dimer No results for input(s): DDIMER in the last 72 hours. Hgb A1c No results for input(s): HGBA1C in the last 72 hours. Lipid Profile No results for input(s): CHOL, HDL, LDLCALC, TRIG, CHOLHDL, LDLDIRECT in the last 72 hours. Thyroid function studies No results for input(s): TSH,  T4TOTAL, T3FREE, THYROIDAB in the last 72 hours.  Invalid input(s): FREET3 Anemia work up No results for input(s): VITAMINB12, FOLATE, FERRITIN, TIBC, IRON, RETICCTPCT in the last 72 hours. Urinalysis    Component Value Date/Time   COLORURINE YELLOW 11/14/2020 1710   APPEARANCEUR HAZY (A) 11/14/2020 1710   APPEARANCEUR Hazy (A) 11/13/2020 0928   LABSPEC 1.015 11/14/2020 1710   PHURINE 7.0 11/14/2020 1710   GLUCOSEU NEGATIVE 11/14/2020 1710   HGBUR NEGATIVE 11/14/2020 1710   BILIRUBINUR NEGATIVE 11/14/2020 1710   BILIRUBINUR Negative 11/13/2020 0928   KETONESUR 20 (A) 11/14/2020 1710   PROTEINUR NEGATIVE 11/14/2020 1710   UROBILINOGEN 0.2 12/20/2014 0945   NITRITE POSITIVE (A) 11/14/2020 1710   LEUKOCYTESUR LARGE (A) 11/14/2020 1710   Sepsis Labs  Invalid input(s): PROCALCITONIN,  WBC,  LACTICIDVEN Microbiology Recent Results (from the past 240 hour(s))  Urine Culture     Status: Abnormal   Collection Time: 11/13/20  9:28 AM   Specimen: Urine   UR  Result Value Ref Range Status   Urine Culture, Routine Final report (A)  Final   Organism ID, Bacteria Escherichia coli (A)  Final    Comment: Cefazolin <=4 ug/mL Cefazolin with an MIC <=16 predicts susceptibility to the oral agents cefaclor, cefdinir, cefpodoxime, cefprozil, cefuroxime, cephalexin, and loracarbef when used for therapy of uncomplicated urinary tract infections due to E. coli, Klebsiella pneumoniae, and Proteus mirabilis. Greater than 100,000 colony forming units per mL    ORGANISM ID, BACTERIA Not applicable  Final   Antimicrobial Susceptibility Comment  Final    Comment:       ** S = Susceptible; I = Intermediate; R = Resistant **                    P = Positive; N = Negative             MICS are expressed in micrograms per mL    Antibiotic                 RSLT#1    RSLT#2    RSLT#3    RSLT#4 Amoxicillin/Clavulanic Acid    S Ampicillin                     S Cefepime                       S Ceftriaxone                     S Cefuroxime                     S Ciprofloxacin                  R Ertapenem                      S Gentamicin                     S Imipenem                       S Levofloxacin                   R Meropenem                      S Nitrofurantoin                 S Piperacillin/Tazobactam        S Tetracycline                   R Tobramycin                     S Trimethoprim/Sulfa             S   Microscopic Examination     Status: Abnormal   Collection Time: 11/13/20  9:28 AM   Urine  Result Value Ref Range Status   WBC, UA 6-10 (A) 0 - 5 /hpf Final   RBC 0-2 0 - 2 /hpf Final   Epithelial Cells (non renal) 0-10 0 - 10 /hpf  Final   Bacteria, UA Few None seen/Few Final  Resp Panel by RT-PCR (Flu A&B, Covid) Nasopharyngeal Swab     Status: None   Collection Time: 11/14/20  8:31 PM   Specimen: Nasopharyngeal Swab; Nasopharyngeal(NP) swabs in vial transport medium  Result Value Ref Range Status   SARS Coronavirus 2 by RT PCR NEGATIVE NEGATIVE Final    Comment: (NOTE) SARS-CoV-2 target nucleic acids are NOT DETECTED.  The SARS-CoV-2 RNA is generally detectable in upper respiratory specimens during the acute phase of infection. The lowest concentration of SARS-CoV-2 viral copies this assay can detect is 138 copies/mL. A negative result does not preclude SARS-Cov-2 infection and should not be used as the sole basis for treatment or other patient management decisions. A negative result may occur with  improper specimen collection/handling, submission of specimen other than nasopharyngeal swab, presence of viral mutation(s) within the areas targeted by this assay, and inadequate number of viral copies(<138 copies/mL). A negative result must be combined with clinical observations, patient history, and epidemiological information. The expected result is Negative.  Fact Sheet for Patients:  BloggerCourse.comhttps://www.fda.gov/media/152166/download  Fact Sheet for Healthcare Providers:   SeriousBroker.ithttps://www.fda.gov/media/152162/download  This test is no t yet approved or cleared by the [place] FDA and  has been authorized for detection and/or diagnosis of SARS-CoV-2 by FDA under an Emergency Use Authorization (EUA). This EUA will remain  in effect (meaning this test can be used) for the duration of the COVID-19 declaration under Section 564(b)(1) of the Act, 21 U.S.C.section 360bbb-3(b)(1), unless the authorization is terminated  or revoked sooner.       Influenza A by PCR NEGATIVE NEGATIVE Final   Influenza B by PCR NEGATIVE NEGATIVE Final    Comment: (NOTE) The Xpert Xpress SARS-CoV-2/FLU/RSV plus assay is intended as an aid in the diagnosis of influenza from Nasopharyngeal swab specimens and should not be used as a sole basis for treatment. Nasal washings and aspirates are unacceptable for Xpert Xpress SARS-CoV-2/FLU/RSV testing.  Fact Sheet for Patients: BloggerCourse.comhttps://www.fda.gov/media/152166/download  Fact Sheet for Healthcare Providers: SeriousBroker.ithttps://www.fda.gov/media/152162/download  This test is not yet approved or cleared by the [place] FDA and has been authorized for detection and/or diagnosis of SARS-CoV-2 by FDA under an Emergency Use Authorization (EUA). This EUA will remain in effect (meaning this test can be used) for the duration of the COVID-19 declaration under Section 564(b)(1) of the Act, 21 U.S.C. section 360bbb-3(b)(1), unless the authorization is terminated or revoked.  Performed at [facility], [address] [place], [place] [address]   Culture, blood (routine x 2)     Status: None   Collection Time: 11/14/20  9:47 PM   Specimen: BLOOD  Result Value Ref Range Status   Specimen Description BLOOD SITE NOT SPECIFIED  Final   Special Requests   Final    BOTTLES DRAWN AEROBIC AND ANAEROBIC Blood Culture adequate volume   Culture   Final    NO GROWTH 5 DAYS Performed at [facility], [address] [place], [place]  [address]    Report Status 11/19/2020 FINAL  Final  Culture, blood (routine x 2)     Status: None   Collection Time: 11/14/20  9:47 PM   Specimen: BLOOD  Result Value Ref Range Status   Specimen Description BLOOD SITE NOT SPECIFIED  Final   Special Requests   Final    BOTTLES DRAWN AEROBIC AND ANAEROBIC Blood Culture adequate volume   Culture   Final    NO GROWTH  5 DAYS Performed at [facility], [address], [place], [place] [address]    Report Status 11/19/2020 FINAL  Final  Culture, Urine     Status: Abnormal   Collection Time: 11/14/20 11:17 PM   Specimen: Urine, Random  Result Value Ref Range Status   Specimen Description URINE, RANDOM  Final   Special Requests   Final    NONE Performed at [facility], [address] [place], [place] [address]    Culture MULTIPLE SPECIES PRESENT, SUGGEST RECOLLECTION (A)  Final   Report Status 11/16/2020 FINAL  Final  Culture, Urine     Status: None   Collection Time: 11/16/20  2:37 PM   Specimen: Urine, Clean Catch  Result Value Ref Range Status   Specimen Description URINE, CLEAN CATCH  Final   Special Requests NONE  Final   Culture   Final    NO GROWTH Performed at [facility], [address], [place], [place] [address]    Report Status 11/18/2020 FINAL  Final     Time coordinating discharge: Over 30 minutes  SIGNED:   Marinda Elk, MD  [facility] 11/19/2020, 9:32 AM Pager   If 7PM-7AM, please contact night-coverage [email or web address] Password [email or web address]

## 2020-11-20 NOTE — Telephone Encounter (Signed)
Transition Care Management Unsuccessful Follow-up Telephone Call  Date of discharge and from where:  Alicia Gilbert 11/19/20  Diagnosis:  pyelonephritis   Attempts:  1st Attempt  Reason for unsuccessful TCM follow-up call:  Left voice message [facility] interpreter LM)

## 2020-11-23 NOTE — Telephone Encounter (Signed)
Contacted patient via [employer].  Appointment made for hospital f/u on 12/16/2020 with Dr. Louanne Skye at 10:55am

## 2020-12-16 ENCOUNTER — Ambulatory Visit (INDEPENDENT_AMBULATORY_CARE_PROVIDER_SITE_OTHER): Payer: Medicaid Other | Admitting: Family Medicine

## 2020-12-16 ENCOUNTER — Encounter: Payer: Self-pay | Admitting: Family Medicine

## 2020-12-16 ENCOUNTER — Other Ambulatory Visit: Payer: Self-pay

## 2020-12-16 VITALS — BP 117/77 | HR 50 | Ht 62.0 in | Wt 187.0 lb

## 2020-12-16 DIAGNOSIS — N12 Tubulo-interstitial nephritis, not specified as acute or chronic: Secondary | ICD-10-CM

## 2020-12-16 DIAGNOSIS — B373 Candidiasis of vulva and vagina: Secondary | ICD-10-CM

## 2020-12-16 DIAGNOSIS — R1013 Epigastric pain: Secondary | ICD-10-CM

## 2020-12-16 DIAGNOSIS — B3731 Acute candidiasis of vulva and vagina: Secondary | ICD-10-CM

## 2020-12-16 LAB — CMP14+EGFR
ALT: 13 IU/L (ref 0–32)
AST: 17 IU/L (ref 0–40)
Albumin/Globulin Ratio: 1.6 (ref 1.2–2.2)
Albumin: 4.2 g/dL (ref 3.8–4.8)
Alkaline Phosphatase: 66 IU/L (ref 44–121)
BUN/Creatinine Ratio: 19 (ref 9–23)
BUN: 12 mg/dL (ref 6–20)
Bilirubin Total: 0.3 mg/dL (ref 0.0–1.2)
CO2: 21 mmol/L (ref 20–29)
Calcium: 8.9 mg/dL (ref 8.7–10.2)
Chloride: 106 mmol/L (ref 96–106)
Creatinine, Ser: 0.64 mg/dL (ref 0.57–1.00)
Globulin, Total: 2.7 g/dL (ref 1.5–4.5)
Glucose: 92 mg/dL (ref 65–99)
Potassium: 3.7 mmol/L (ref 3.5–5.2)
Sodium: 140 mmol/L (ref 134–144)
Total Protein: 6.9 g/dL (ref 6.0–8.5)
eGFR: 119 mL/min/{1.73_m2} (ref >59)

## 2020-12-16 LAB — MICROSCOPIC EXAMINATION: Renal Epithel, UA: NONE SEEN /hpf

## 2020-12-16 LAB — CBC WITH DIFFERENTIAL/PLATELET
Basophils Absolute: 0 10*3/uL (ref 0.0–0.2)
Basos: 1 %
EOS (ABSOLUTE): 0.1 10*3/uL (ref 0.0–0.4)
Eos: 1 %
Hematocrit: 35.2 % (ref 34.0–46.6)
Hemoglobin: 11.8 g/dL (ref 11.1–15.9)
Immature Grans (Abs): 0 10*3/uL (ref 0.0–0.1)
Immature Granulocytes: 0 %
Lymphocytes Absolute: 2.1 10*3/uL (ref 0.7–3.1)
Lymphs: 47 %
MCH: 30.7 pg (ref 26.6–33.0)
MCHC: 33.5 g/dL (ref 31.5–35.7)
MCV: 92 fL (ref 79–97)
Monocytes Absolute: 0.3 10*3/uL (ref 0.1–0.9)
Monocytes: 6 %
Neutrophils Absolute: 2 10*3/uL (ref 1.4–7.0)
Neutrophils: 45 %
Platelets: 278 10*3/uL (ref 150–450)
RBC: 3.84 x10E6/uL (ref 3.77–5.28)
RDW: 13 % (ref 11.7–15.4)
WBC: 4.5 10*3/uL (ref 3.4–10.8)

## 2020-12-16 LAB — URINALYSIS, COMPLETE
Bilirubin, UA: NEGATIVE
Glucose, UA: NEGATIVE
Ketones, UA: NEGATIVE
Leukocytes,UA: NEGATIVE
Nitrite, UA: POSITIVE — AB
Protein,UA: NEGATIVE
Specific Gravity, UA: 1.025 (ref 1.005–1.030)
Urobilinogen, Ur: 0.2 mg/dL (ref 0.2–1.0)
pH, UA: 6 (ref 5.0–7.5)

## 2020-12-16 MED ORDER — ESOMEPRAZOLE MAGNESIUM 40 MG PO CPDR: 40.0000 mg | DELAYED_RELEASE_CAPSULE | Freq: Every day | ORAL | 3 refills | Status: DC

## 2020-12-16 MED ORDER — FLUCONAZOLE 150 MG PO TABS: 150.0000 mg | ORAL_TABLET | Freq: Once | ORAL | 0 refills | Status: AC

## 2020-12-16 NOTE — Progress Notes (Signed)
BP 117/77   Pulse (!) 50   Ht '5\' 2"'  (1.575 m)   Wt 187 lb (84.8 kg)   SpO2 100%   BMI 34.20 kg/m    Subjective:   Patient ID: Alicia Gilbert, female    DOB: 04-Jun-1985, 35 y.o.   MRN: [id number]  HPI: Alicia Gilbert is a 35 y.o. female presenting on 12/16/2020 for Hospitalization Follow-up   HPI Patient is coming in today for hospital follow-up for pyelonephritis.  She was in the hospital from 11/14/2020 until 11/19/2020.  She was treated for pyelonephritis which is found to have E. coli which is sensitive to cephalosporins.  She was treated with Rocephin and then Omnicef.  She still has some soreness in her left flank and suprapubic region but it has greatly improved, she is trying to hydrate a lot and she is making urine.  She does still have vaginal discharge and irritation.  She never did get treatment with Diflucan for vaginal irritation  She is still having her epigastric discomfort which she was having previously, she did stop the Pylera because they told her to at the hospital.  Relevant past medical, surgical, family and social history reviewed and updated as indicated. Interim medical history since our last visit reviewed. Allergies and medications reviewed and updated.  Review of Systems  Constitutional:  Negative for chills and fever.  Eyes:  Negative for visual disturbance.  Respiratory:  Negative for chest tightness and shortness of breath.   Cardiovascular:  Negative for chest pain and leg swelling.  Gastrointestinal:  Positive for abdominal pain.  Genitourinary:  Positive for flank pain and pelvic pain. Negative for difficulty urinating, dysuria, frequency, hematuria, urgency, vaginal bleeding, vaginal discharge and vaginal pain.  Musculoskeletal:  Negative for back pain and gait problem.  Skin:  Negative for rash.  Neurological:  Negative for light-headedness and headaches.  Psychiatric/Behavioral:  Negative for agitation and behavioral problems.   All  other systems reviewed and are negative.  Per HPI unless specifically indicated above   Allergies as of 12/16/2020       Reactions   Penicillins Hives, Rash   Reaction: Childhood   Shellfish Allergy Hives, Swelling        Medication List        Accurate as of December 16, 2020 11:10 AM. If you have any questions, ask your nurse or doctor.          STOP taking these medications    IBUPROFEN IB PO Stopped by: Worthy Rancher, MD   Pylera [id number] MG capsule Generic drug: bismuth-metronidazole-tetracycline Stopped by: Fransisca Kaufmann Toivo Bordon, MD       TAKE these medications    esomeprazole 40 MG capsule Commonly known as: NexIUM Take 1 capsule (40 mg total) by mouth daily. Started by: Worthy Rancher, MD   fluconazole 150 MG tablet Commonly known as: Diflucan Take 1 tablet (150 mg total) by mouth once for 1 dose. Started by: Worthy Rancher, MD   multivitamin with minerals tablet Take 1 tablet by mouth daily. Woman's         Objective:   BP 117/77   Pulse (!) 50   Ht '5\' 2"'  (1.575 m)   Wt 187 lb (84.8 kg)   SpO2 100%   BMI 34.20 kg/m   Wt Readings from Last 3 Encounters:  12/16/20 187 lb (84.8 kg)  11/14/20 189 lb 2.5 oz (85.8 kg)  11/13/20 189 lb (85.7 kg)    Physical Exam Vitals  and nursing note reviewed.  Constitutional:      General: She is not in acute distress.    Appearance: She is well-developed. She is not diaphoretic.  Eyes:     Conjunctiva/sclera: Conjunctivae normal.  Cardiovascular:     Rate and Rhythm: Normal rate and regular rhythm.     Heart sounds: Normal heart sounds. No murmur heard. Pulmonary:     Effort: Pulmonary effort is normal. No respiratory distress.     Breath sounds: Normal breath sounds. No wheezing.  Abdominal:     General: Abdomen is flat. Bowel sounds are normal.     Palpations: Abdomen is soft.     Tenderness: There is abdominal tenderness (Epigastric and suprapubic). There is left CVA tenderness.  There is no guarding or rebound.  Musculoskeletal:        General: No tenderness. Normal range of motion.  Skin:    General: Skin is warm and dry.     Findings: No rash.  Neurological:     Mental Status: She is alert and oriented to person, place, and time.     Coordination: Coordination normal.  Psychiatric:        Behavior: Behavior normal.      Assessment & Plan:   Problem List Items Addressed This Visit       Genitourinary   Pyelonephritis of right kidney - Primary   Relevant Medications   fluconazole (DIFLUCAN) 150 MG tablet   Other Relevant Orders   CBC with Differential/Platelet   CMP14+EGFR   Urinalysis, Complete   Urine Culture     Other   Abdominal pain, epigastric   Relevant Medications   esomeprazole (NEXIUM) 40 MG capsule   Other Visit Diagnoses     Yeast vaginitis       Relevant Medications   fluconazole (DIFLUCAN) 150 MG tablet       Give treatment for yeast vaginitis for Diflucan, will start Nexium for her, sent Diflucan as well Follow up plan: Return in about 4 weeks (around 01/13/2021), or if symptoms worsen or fail to improve, for Recheck epigastric pain and urinary discomfort.  Counseling provided for all of the vaccine components Orders Placed This Encounter  Procedures   Urine Culture   CBC with Differential/Platelet   CMP14+EGFR   Urinalysis, Complete    Caryl Pina, MD [facility] 12/16/2020, 11:10 AM

## 2020-12-18 LAB — URINE CULTURE

## 2020-12-21 ENCOUNTER — Other Ambulatory Visit: Payer: Self-pay | Admitting: Family Medicine

## 2020-12-21 MED ORDER — NITROFURANTOIN MONOHYD MACRO 100 MG PO CAPS: 100.0000 mg | ORAL_CAPSULE | Freq: Two times a day (BID) | ORAL | 0 refills | Status: DC

## 2020-12-21 NOTE — Progress Notes (Unsigned)
Please let her know that her urine grew bacteria that was resistant to almost everything except Macrobid, sent in the [facility] for her to her pharmacy.

## 2021-02-02 NOTE — Progress Notes (Signed)
Referring Provider: Dettinger, Elige Radon, MD Primary Care Physician:  Dettinger, Elige Radon, MD Primary GI Physician: Dr. Marletta Lor  Chief Complaint  Patient presents with   Abdominal Pain    Epigastric pain and into left side abd pain getting worse   Nausea    No vomiting    HPI:   Alicia Gilbert is a 35 y.o. female presenting today for follow-up of epigastric abdominal pain, GERD, and H. pylori.  Last seen in our office 08/19/2020 for the same.  At the time of her last office visit, noted 3-year history of epigastric abdominal pain, worse since having her baby 6 months prior.  PCP had increased omeprazole to 40 mg daily and added famotidine 20 mg twice daily. Per review of referral information, patient had also tested positive for H. pylori in February 2022 and was treated with metronidazole, clarithromycin, and PPI with "50% improvement".  At the time of her visit, she reported intermittent epigastric pain as often as 3 times a day, lasting 4 to 5 minutes, described as sharp.  PPI not helping much.  H. pylori treatment helped a little.  Associated nausea/vomiting usually early in the morning.  Around 3 AM, felt "water coming out" and sometimes bitter taste.  Dark/black stools in the setting of iron for anemia.  No hematochezia.  Rare ibuprofen.  Plan to stop Prilosec and start Protonix 40 mg twice daily, proceed with EGD with gastric biopsies to verify H. pylori eradication.  EGD 09/28/2020: Gastritis biopsied, otherwise normal exam.  Recommend PPI twice daily and avoiding NSAIDs.  Pathology revealed chronic active gastritis with H. pylori.  Recommended treating with Prevpac.  Hold PPI until after completing Prevpac, then resume thereafter.  Follow-up with PCP 10/22/2020 for continued abdominal pain with intermittent nausea.  She reported completing 14-day prescription for H. pylori with improvement, but symptoms have returned over the last week.  Symptoms worsened by eating tomato based  products, bread products, or spicy foods.  She was prescribed Pylera.  Plan to try for a longer course of 21 days.  Admitted in July with pyelonephritis.  Discharge medication list included cefdinir, ibuprofen, ?Pylera, tramadol.  No PPI.  Follow with PCP 12/16/20 for follow-up of pyelonephritis.  She reported still having some soreness in her left flank and suprapubic region but greatly improved.  Still with vaginal discharge and irritation.  Still with epigastric discomfort.  Ibuprofen was discontinued and Nexium 40 mg daily was prescribed.  She was also prescribed Diflucan and labs and urinalysis was ordered.  CBC, CMP unremarkable.  She did have UTI with culture growing E. coli.  She was prescribed Macrobid.   Today: Patient reports ongoing intermittent, sharp epigastric abdominal pain that radiates into her chest at times.  Associated nausea with occasional vomiting.  Abdominal pain occurs about 3 times a day and last about 10 minutes and spontaneously resolved.  Symptoms are not triggered by meals.  States food actually help her symptoms.  She did notice improvement when she was taking Prevpac as well as when she was taking Pylera.  States her symptoms had essentially resolved with the extended course of Pylera, but then symptoms returned.  Feels it is increasing back to where she was originally.  She has not started Nexium yet.  She is waiting for this visit prior to starting.  Admits to reflux symptoms at least 3-4 times a week.  No antibiotics in the last 4 weeks.  Has not taken any Tums, Pepto-Bismol, other antacids, or  NSAIDs.  Denies BRBPR or melena.  Admits to occasional constipation, but nothing routine and takes a stool softener as needed which works well for her.  Denies diarrhea.  Bowel movements do not affect her abdominal pain.. Takes stool softener as needed. No diarrhea.    RUQ ultrasound April 2022 with no evidence of gallstones.  CBD normal.  Liver normal. CT A/P with  contrast 11/06/2020 and CT A/P without contrast 11/14/2020 with no GI/biliary abnormality.  Past Medical History:  Diagnosis Date   H. pylori infection    S/p treatment with metronidazole, clarithromycin, and PPI February 2022, treated with Prevpac May 2022, treated with 21-day course of Pylera in June 2022.   Heart murmur    childhood   Hypertension    Kidney infection     Past Surgical History:  Procedure Laterality Date   APPENDECTOMY     BIOPSY  09/28/2020   Procedure: BIOPSY;  Surgeon: Lanelle Bal, DO;  Location: [facility];  Service: Endoscopy;;   ESOPHAGOGASTRODUODENOSCOPY (EGD) WITH PROPOFOL N/A 09/28/2020   Surgeon: Lanelle Bal, DO; Gastritis biopsied, otherwise normal exam.  Recommend PPI twice daily and avoiding NSAIDs.  Pathology revealed chronic active gastritis with H. pylori.    Current Outpatient Medications  Medication Sig Dispense Refill   folic acid (FOLVITE) 1 MG tablet Take 1 mg by mouth daily.     Multiple Vitamins-Minerals (MULTIVITAMIN WITH MINERALS) tablet Take 1 tablet by mouth daily. Woman's     esomeprazole (NEXIUM) 40 MG capsule Take 1 capsule (40 mg total) by mouth daily. (Patient not taking: Reported on 02/03/2021) 30 capsule 3   No current facility-administered medications for this visit.    Allergies as of 02/03/2021 - Review Complete 02/03/2021  Allergen Reaction Noted   Penicillins Hives and Rash 06/04/2013   Shellfish allergy Hives and Swelling 09/18/2020    Family History  Problem Relation Age of Onset   Heart disease Mother    Gastric cancer Mother    Breast cancer Mother    Heart disease Father    Colon cancer Neg Hx     Social History   Socioeconomic History   Marital status: Married    Spouse name: Not on file   Number of children: Not on file   Years of education: Not on file   Highest education level: Not on file  Occupational History   Not on file  Tobacco Use   Smoking status: Never   Smokeless tobacco:  Never  Vaping Use   Vaping Use: Never used  Substance and Sexual Activity   Alcohol use: Never   Drug use: Never   Sexual activity: Yes    Birth control/protection: None, I.U.D.  Other Topics Concern   Not on file  Social History Narrative   ** Merged History Encounter **       Social Determinants of Health   Financial Resource Strain: Not on file  Food Insecurity: Not on file  Transportation Needs: Not on file  Physical Activity: Not on file  Stress: Not on file  Social Connections: Not on file    Review of Systems: Gen: Denies fever, cold or flulike symptoms, presyncope, syncope. CV: Admits to occasional chest pain with exertion, no palpitations.  Resp: Denies dyspnea or cough.  GI: See HPI Heme: See HPI  Physical Exam: BP 118/79   Pulse (!) 55   Temp (!) 97.3 F (36.3 C)   Ht 5\' 2"  (1.575 m)   Wt 186 lb 12.8 oz (  84.7 kg)   LMP 01/29/2021   BMI 34.17 kg/m  General:   Alert and oriented. No distress noted. Pleasant and cooperative.  Head:  Normocephalic and atraumatic. Eyes:  Conjuctiva clear without scleral icterus. Heart:  S1, S2 present without murmurs appreciated. Lungs:  Clear to auscultation bilaterally. No wheezes, rales, or rhonchi. No distress.  Abdomen:  +BS, soft, and non-distended. Mild TTP in the epigastric area. Minimal TTP in LLQ (patient states she hasn't noticed this until I palpated the area).  No rebound or guarding. No HSM or masses noted. Msk:  Symmetrical without gross deformities. Normal posture. Extremities:  Without edema. Neurologic:  Alert and  oriented x4 Psych:  Normal mood and affect.    Assessment: 35 year old female presenting today for follow-up of epigastric pain with associated nausea and occasional vomiting, GERD, and history of H. pylori.  Patient has 3-year history of intermittent, daily, sharp epigastric abdominal pain radiating into her chest at times with associated nausea with occasional vomiting.  Ultimately diagnosed  with H. pylori in February 2022 and was treated with metronidazole, clarithromycin, and PPI with some improvement.  EGD May 2022 with gastritis, biopsy revealed active gastritis with H. pylori.  She was treated with Prevpac.  Noted some improvement, but PCP prescribed 21-day course of Pylera due to return of symptoms.  Again, symptoms improved with Pylera but have now returned.    Query whether symptoms may be related to chronic H. pylori infection.  May also be secondary to residual gastritis and uncontrolled GERD as she has reflux symptoms 3-4 times weekly, not on a PPI currently.  Symptoms were not consistent with biliary etiology as she reports improvement with food.  Prior abdominal imaging includes RUQ ultrasound April 2022 with normal exam.  CT A/P with contrast 11/06/2020 and CT A/P without contrast 11/14/2020 with no GI or biliary abnormality.  As she has not taken any antacid medication or Pepto-Bismol in the last 2 weeks or antibiotics in the last 4 weeks, patient will complete H. pylori breath test to confirm H. pylori eradication.  If negative, will resume PPI daily and monitor.  Also noted mild LLQ tenderness palpation on exam.  Patient denies any pain in this area prior to my exam today and denies pain unless area is being palpated.  No significant lower GI symptoms.  No rebound on exam.  She will monitor for now and let us know of any worsening symptoms.  Chest pain: Patient reports intermittent chest pain with exertion.  No associated shortness of breath, presyncope, syncope.  States she just feels worn out when it happens.  Advised she reach out to her PCP to discuss further.   Plan: Complete H. pylori breath test. No antacids, Pepto-Bismol, or antibiotics prior to completing this test. If H. pylori breath test is negative, we will start Nexium 40 mg daily. Avoid all NSAIDs. Monitor for worsening LLQ abdominal pain. Patient is to discuss chest pain with PCP.   Ermalinda Memos,  PA-C [facility] 02/03/2021

## 2021-02-03 ENCOUNTER — Other Ambulatory Visit: Payer: Self-pay

## 2021-02-03 ENCOUNTER — Ambulatory Visit (INDEPENDENT_AMBULATORY_CARE_PROVIDER_SITE_OTHER): Payer: Medicaid Other | Admitting: Gastroenterology

## 2021-02-03 ENCOUNTER — Encounter: Payer: Self-pay | Admitting: Gastroenterology

## 2021-02-03 VITALS — BP 118/79 | HR 55 | Temp 97.3°F | Ht 62.0 in | Wt 186.8 lb

## 2021-02-03 DIAGNOSIS — R079 Chest pain, unspecified: Secondary | ICD-10-CM | POA: Insufficient documentation

## 2021-02-03 DIAGNOSIS — K219 Gastro-esophageal reflux disease without esophagitis: Secondary | ICD-10-CM | POA: Diagnosis not present

## 2021-02-03 DIAGNOSIS — R1013 Epigastric pain: Secondary | ICD-10-CM | POA: Diagnosis not present

## 2021-02-03 DIAGNOSIS — R1032 Left lower quadrant pain: Secondary | ICD-10-CM | POA: Insufficient documentation

## 2021-02-03 DIAGNOSIS — Z8619 Personal history of other infectious and parasitic diseases: Secondary | ICD-10-CM | POA: Diagnosis not present

## 2021-02-03 NOTE — Patient Instructions (Addendum)
Please have H. pylori breath test completed at [facility]  [facility] Address: [address], [place], [place] [address]  Do not take Nexium or any other antacid medications including Tums, Pepto-Bismol, or any antibiotics before completing this test.  We will call you with results and further recommendations.  It was a pleasure to meet you today!  Alicia Memos, PA-C [facility]   Spanish Translation:  Realice una prueba de aliento para H. pylori en [facility]. [address], [place], [place] [address]  No tome Nexium ni ningn otro medicamento anticido, incluidos Tums, Pepto-Bismol o cualquier antibitico antes de completar esta prueba.  Te llamaremos con los resultados y ms recomendaciones.  Fue un placer conocerte hoy!  Alicia Memos, PA-C [facility]

## 2021-02-11 ENCOUNTER — Other Ambulatory Visit: Payer: Self-pay

## 2021-02-11 ENCOUNTER — Other Ambulatory Visit (HOSPITAL_COMMUNITY)
Admission: RE | Admit: 2021-02-11 | Discharge: 2021-02-11 | Disposition: A | Discharge status: Home / Self Care | Payer: Medicaid Other | Source: Ambulatory Visit | Attending: Gastroenterology | Admitting: Gastroenterology

## 2021-02-11 DIAGNOSIS — Z8619 Personal history of other infectious and parasitic diseases: Secondary | ICD-10-CM | POA: Insufficient documentation

## 2021-02-12 LAB — H. PYLORI BREATH TEST: H. pylori Breath Test: DETECTED — AB

## 2021-02-25 ENCOUNTER — Encounter: Payer: Self-pay | Admitting: Internal Medicine

## 2021-02-25 ENCOUNTER — Other Ambulatory Visit: Payer: Self-pay | Admitting: Gastroenterology

## 2021-02-25 DIAGNOSIS — K219 Gastro-esophageal reflux disease without esophagitis: Secondary | ICD-10-CM

## 2021-02-25 DIAGNOSIS — A048 Other specified bacterial intestinal infections: Secondary | ICD-10-CM

## 2021-02-25 MED ORDER — PYLERA 140-125-125 MG PO CAPS
3.0000 | ORAL_CAPSULE | Freq: Three times a day (TID) | ORAL | 0 refills | Status: DC
Start: 2021-02-25 — End: 2021-07-19

## 2021-02-25 MED ORDER — PANTOPRAZOLE SODIUM 40 MG PO TBEC: 40.0000 mg | DELAYED_RELEASE_TABLET | Freq: Two times a day (BID) | ORAL | 3 refills | Status: DC

## 2021-06-18 ENCOUNTER — Ambulatory Visit: Payer: Medicaid Other | Admitting: Family Medicine

## 2021-06-21 ENCOUNTER — Encounter: Payer: Self-pay | Admitting: Family Medicine

## 2021-06-21 ENCOUNTER — Emergency Department (HOSPITAL_COMMUNITY): Payer: Medicaid Other

## 2021-06-21 ENCOUNTER — Emergency Department (HOSPITAL_COMMUNITY)
Admission: EM | Admit: 2021-06-21 | Discharge: 2021-06-21 | Disposition: A | Discharge status: Home / Self Care | Payer: Medicaid Other | Attending: Emergency Medicine | Admitting: Emergency Medicine

## 2021-06-21 ENCOUNTER — Encounter (HOSPITAL_COMMUNITY): Payer: Self-pay

## 2021-06-21 ENCOUNTER — Other Ambulatory Visit: Payer: Self-pay

## 2021-06-21 DIAGNOSIS — R519 Headache, unspecified: Secondary | ICD-10-CM | POA: Insufficient documentation

## 2021-06-21 DIAGNOSIS — M549 Dorsalgia, unspecified: Secondary | ICD-10-CM | POA: Insufficient documentation

## 2021-06-21 DIAGNOSIS — R42 Dizziness and giddiness: Secondary | ICD-10-CM | POA: Diagnosis not present

## 2021-06-21 DIAGNOSIS — W01198A Fall on same level from slipping, tripping and stumbling with subsequent striking against other object, initial encounter: Secondary | ICD-10-CM | POA: Insufficient documentation

## 2021-06-21 DIAGNOSIS — R109 Unspecified abdominal pain: Secondary | ICD-10-CM

## 2021-06-21 DIAGNOSIS — W19XXXA Unspecified fall, initial encounter: Secondary | ICD-10-CM

## 2021-06-21 DIAGNOSIS — R1013 Epigastric pain: Secondary | ICD-10-CM | POA: Diagnosis present

## 2021-06-21 DIAGNOSIS — R1110 Vomiting, unspecified: Secondary | ICD-10-CM | POA: Insufficient documentation

## 2021-06-21 LAB — CBC WITH DIFFERENTIAL/PLATELET
Abs Immature Granulocytes: 0.04 10*3/uL (ref 0.00–0.07)
Basophils Absolute: 0.1 10*3/uL (ref 0.0–0.1)
Basophils Relative: 1 %
Eosinophils Absolute: 0 10*3/uL (ref 0.0–0.5)
Eosinophils Relative: 0 %
HCT: 39.5 % (ref 36.0–46.0)
Hemoglobin: 12.9 g/dL (ref 12.0–15.0)
Immature Granulocytes: 0 %
Lymphocytes Relative: 19 %
Lymphs Abs: 2 10*3/uL (ref 0.7–4.0)
MCH: 30.8 pg (ref 26.0–34.0)
MCHC: 32.7 g/dL (ref 30.0–36.0)
MCV: 94.3 fL (ref 80.0–100.0)
Monocytes Absolute: 0.4 10*3/uL (ref 0.1–1.0)
Monocytes Relative: 4 %
Neutro Abs: 8.2 10*3/uL — ABNORMAL HIGH (ref 1.7–7.7)
Neutrophils Relative %: 76 %
Platelets: 302 10*3/uL (ref 150–400)
RBC: 4.19 MIL/uL (ref 3.87–5.11)
RDW: 13.4 % (ref 11.5–15.5)
WBC: 10.7 10*3/uL — ABNORMAL HIGH (ref 4.0–10.5)
nRBC: 0 % (ref 0.0–0.2)

## 2021-06-21 LAB — PREGNANCY, URINE: Preg Test, Ur: NEGATIVE

## 2021-06-21 LAB — URINALYSIS, ROUTINE W REFLEX MICROSCOPIC
Bilirubin Urine: NEGATIVE
Glucose, UA: NEGATIVE mg/dL
Hgb urine dipstick: NEGATIVE
Ketones, ur: NEGATIVE mg/dL
Nitrite: NEGATIVE
Protein, ur: 100 mg/dL — AB
Specific Gravity, Urine: 1.025 (ref 1.005–1.030)
pH: 5 (ref 5.0–8.0)

## 2021-06-21 LAB — COMPREHENSIVE METABOLIC PANEL
ALT: 11 U/L (ref 0–44)
AST: 17 U/L (ref 15–41)
Albumin: 4.1 g/dL (ref 3.5–5.0)
Alkaline Phosphatase: 53 U/L (ref 38–126)
Anion gap: 7 (ref 5–15)
BUN: 15 mg/dL (ref 6–20)
CO2: 21 mmol/L — ABNORMAL LOW (ref 22–32)
Calcium: 8.9 mg/dL (ref 8.9–10.3)
Chloride: 109 mmol/L (ref 98–111)
Creatinine, Ser: 0.73 mg/dL (ref 0.44–1.00)
GFR, Estimated: >60 mL/min (ref >60)
Glucose, Bld: 105 mg/dL — ABNORMAL HIGH (ref 70–99)
Potassium: 3.8 mmol/L (ref 3.5–5.1)
Sodium: 137 mmol/L (ref 135–145)
Total Bilirubin: 0.6 mg/dL (ref 0.3–1.2)
Total Protein: 7.5 g/dL (ref 6.5–8.1)

## 2021-06-21 LAB — LIPASE, BLOOD: Lipase: 31 U/L (ref 11–51)

## 2021-06-21 MED ORDER — IOHEXOL 300 MG/ML  SOLN
100.0000 mL | Freq: Once | INTRAMUSCULAR | Status: AC | PRN
  Administered 2021-06-21: 100 mL via INTRAVENOUS

## 2021-06-21 MED ORDER — ACETAMINOPHEN 325 MG PO TABS
650.0000 mg | ORAL_TABLET | Freq: Once | ORAL | Status: AC
  Administered 2021-06-21: 650 mg via ORAL
  Filled 2021-06-21: qty 2

## 2021-06-21 MED ORDER — ONDANSETRON 4 MG PO TBDP: 4.0000 mg | ORAL_TABLET | Freq: Three times a day (TID) | ORAL | 0 refills | Status: DC | PRN

## 2021-06-21 MED ORDER — IBUPROFEN 400 MG PO TABS
600.0000 mg | ORAL_TABLET | Freq: Once | ORAL | Status: AC
  Administered 2021-06-21: 600 mg via ORAL
  Filled 2021-06-21: qty 1

## 2021-06-21 NOTE — ED Provider Notes (Signed)
[facility] EMERGENCY DEPARTMENT Provider Note   CSN: [id number] Arrival date & time: 06/21/21  1222     History  No chief complaint on file.   Alicia Gilbert is a 36 y.o. female.  Patient presents chief complaint of abdominal pain back pain and headache.  She states that yesterday she was mopping and slipped and fell backwards.  She denies loss of consciousness but had episode of dizziness.  She vomited and complaining of headache today.  Also complaining of mid to upper abdominal pain.  Denies diarrhea.  Denies chest pain.      Home Medications Prior to Admission medications   Medication Sig Start Date End Date Taking? Authorizing Provider  ondansetron (ZOFRAN-ODT) 4 MG disintegrating tablet Take 1 tablet (4 mg total) by mouth every 8 (eight) hours as needed for nausea or vomiting. 06/21/21  Yes Cheryll Cockayne, MD  bismuth-metronidazole-tetracycline [facility]) [phone] MG capsule Take 3 capsules by mouth 4 (four) times daily -  before meals and at bedtime for 14 days. 02/25/21 03/11/21  Letta Median, PA-C  folic acid (FOLVITE) 1 MG tablet Take 1 mg by mouth daily.    [provider]  Multiple Vitamins-Minerals (MULTIVITAMIN WITH MINERALS) tablet Take 1 tablet by mouth daily. Woman's    [provider]  pantoprazole (PROTONIX) 40 MG tablet Take 1 tablet (40 mg total) by mouth 2 (two) times daily before a meal. 02/25/21 02/25/22  Letta Median, PA-C      Allergies    Penicillins and Shellfish allergy    Review of Systems   Review of Systems  Constitutional:  Negative for fever.  HENT:  Negative for ear pain.   Eyes:  Negative for pain.  Respiratory:  Negative for cough.   Cardiovascular:  Negative for chest pain.  Gastrointestinal:  Positive for abdominal pain.  Genitourinary:  Negative for flank pain.  Musculoskeletal:  Negative for back pain.  Skin:  Negative for rash.  Neurological:  Positive for headaches.    Physical Exam Updated Vital Signs BP 104/63 (BP Location: Right Arm)    Pulse 62    Temp 98.8 F (37.1 C)    Resp 14    SpO2 100%  Physical Exam Constitutional:      General: She is not in acute distress.    Appearance: Normal appearance.  HENT:     Head: Normocephalic.     Nose: Nose normal.  Eyes:     Extraocular Movements: Extraocular movements intact.  Cardiovascular:     Rate and Rhythm: Normal rate.  Pulmonary:     Effort: Pulmonary effort is normal.  Abdominal:     Tenderness: There is abdominal tenderness.     Comments: Moderate tenderness in epigastric region, no guarding or rebound.  Musculoskeletal:        General: Normal range of motion.     Cervical back: Normal range of motion.  Neurological:     General: No focal deficit present.     Mental Status: She is alert. Mental status is at baseline.    ED Results / Procedures / Treatments   Labs (all labs ordered are listed, but only abnormal results are displayed) Labs Reviewed  CBC WITH DIFFERENTIAL/PLATELET - Abnormal; Notable for the following components:      Result Value   WBC 10.7 (*)    Neutro Abs 8.2 (*)    All other components within normal limits  COMPREHENSIVE METABOLIC PANEL - Abnormal; Notable for the following components:  CO2 21 (*)    Glucose, Bld 105 (*)    All other components within normal limits  URINALYSIS, ROUTINE W REFLEX MICROSCOPIC - Abnormal; Notable for the following components:   Color, Urine AMBER (*)    APPearance CLOUDY (*)    Protein, ur 100 (*)    Leukocytes,Ua TRACE (*)    Bacteria, UA RARE (*)    All other components within normal limits  LIPASE, BLOOD  PREGNANCY, URINE    EKG None  Radiology DG Thoracic Spine 2 View  Result Date: 06/21/2021 CLINICAL DATA:  Back pain after fall. EXAM: THORACIC SPINE 2 VIEWS COMPARISON:  None. FINDINGS: There is no evidence of thoracic spine fracture. Alignment is normal. No other significant bone abnormalities are identified.  IMPRESSION: Negative. Electronically Signed   By: Lupita Raider M.D.   On: 06/21/2021 16:49   DG Lumbar Spine Complete  Result Date: 06/21/2021 CLINICAL DATA:  Low back pain after fall. EXAM: LUMBAR SPINE - COMPLETE 4+ VIEW COMPARISON:  None. FINDINGS: There is no evidence of lumbar spine fracture. Alignment is normal. Intervertebral disc spaces are maintained. IMPRESSION: Negative. Electronically Signed   By: Lupita Raider M.D.   On: 06/21/2021 16:50   CT Head Wo Contrast  Result Date: 06/21/2021 CLINICAL DATA:  Head trauma EXAM: CT HEAD WITHOUT CONTRAST TECHNIQUE: Contiguous axial images were obtained from the base of the skull through the vertex without intravenous contrast. RADIATION DOSE REDUCTION: This exam was performed according to the departmental dose-optimization program which includes automated exposure control, adjustment of the mA and/or kV according to patient size and/or use of iterative reconstruction technique. COMPARISON:  CT head 10/21/2018 FINDINGS: Brain: No acute intracranial hemorrhage, mass effect, or herniation. No extra-axial fluid collections. No evidence of acute territorial infarct. No hydrocephalus. Vascular: No hyperdense vessel or unexpected calcification. Skull: Normal. Negative for fracture or focal lesion. Sinuses/Orbits: No acute finding. Other: None. IMPRESSION: No acute intracranial process identified. Electronically Signed   By: Jannifer Hick M.D.   On: 06/21/2021 14:48   CT Cervical Spine Wo Contrast  Result Date: 06/21/2021 CLINICAL DATA:  Polytrauma, blunt EXAM: CT CERVICAL SPINE WITHOUT CONTRAST TECHNIQUE: Multidetector CT imaging of the cervical spine was performed without intravenous contrast. Multiplanar CT image reconstructions were also generated. RADIATION DOSE REDUCTION: This exam was performed according to the departmental dose-optimization program which includes automated exposure control, adjustment of the mA and/or kV according to patient  size and/or use of iterative reconstruction technique. COMPARISON:  None. FINDINGS: Alignment: Normal. Skull base and vertebrae: No acute fracture. No primary bone lesion or focal pathologic process. Soft tissues and spinal canal: No prevertebral fluid or swelling. No visible canal hematoma. Disc levels:  No visible impingement.  Preserved disc heights. Upper chest: Negative. Other: None. IMPRESSION: No acute cervical spine fracture. Electronically Signed   By: Caprice Renshaw M.D.   On: 06/21/2021 14:50   CT ABDOMEN PELVIS W CONTRAST  Result Date: 06/21/2021 CLINICAL DATA:  Abdominal pain, acute, nonlocalized EXAM: CT ABDOMEN AND PELVIS WITH CONTRAST TECHNIQUE: Multidetector CT imaging of the abdomen and pelvis was performed using the standard protocol following bolus administration of intravenous contrast. RADIATION DOSE REDUCTION: This exam was performed according to the departmental dose-optimization program which includes automated exposure control, adjustment of the mA and/or kV according to patient size and/or use of iterative reconstruction technique. CONTRAST:  OMNIPAQUE IOHEXOL 300 MG/ML  SOLN COMPARISON:  CT 11/14/2020 FINDINGS: Lower chest: No acute abnormality. Hepatobiliary: No focal liver abnormality  is seen. The gallbladder is unremarkable. Pancreas: Unremarkable. No pancreatic ductal dilatation or surrounding inflammatory changes. Spleen: Normal in size without focal abnormality. Adrenals/Urinary Tract: Adrenal glands are unremarkable. Kidneys are normal, without renal calculi, focal lesion, or hydronephrosis. Bladder is unremarkable. Stomach/Bowel: The stomach is within normal limits. There is no evidence of bowel obstruction.Fluid-filled distal ileum and terminal ileum with mild wall thickening, mural hyperenhancement, and mild mesenteric stranding. The appendix is normal. Vascular/Lymphatic: No significant vascular findings are present. No enlarged abdominal or pelvic lymph nodes.  Reproductive: There is an IUD in place. Left-sided corpus luteal cyst. Other: No bowel containing hernia.  No ascites.  No free air. Musculoskeletal: No acute or significant osseous findings. IMPRESSION: Mild wall thickening and mesenteric stranding involving the distal and terminal ileum, suggesting and infectious or inflammatory process. Given involvement of the terminal ileum, inflammatory bowel disease is in the differential. Normal appendix. Electronically Signed   By: Caprice Renshaw M.D.   On: 06/21/2021 15:03    Procedures Procedures    Medications Ordered in ED Medications  iohexol (OMNIPAQUE) 300 MG/ML solution 100 mL (100 mLs Intravenous Contrast Given 06/21/21 1441)  acetaminophen (TYLENOL) tablet 650 mg (650 mg Oral Given 06/21/21 1653)  ibuprofen (ADVIL) tablet 600 mg (600 mg Oral Given 06/21/21 1653)    ED Course/ Medical Decision Making/ A&P                           Medical Decision Making Risk OTC drugs.   Chart review shows visit September 20 2022nd for H. pylori with gastroenterology.  Labs today unremarkable.  White count normal chemistry normal urinalysis negative.  Multiple imaging modalities pursued.  No acute pathology noted.  CT abdomen pelvis did note some evidence of enteritis likely secondary to her vomiting episodes at home.  Patient otherwise remained stable vitals are normal.  Recommending outpatient follow-up with her doctor within 3 to 4 days.  Recommending immediate return for worsening pain or any additional concerns.        Final Clinical Impression(s) / ED Diagnoses Final diagnoses:  Fall, initial encounter  Abdominal pain, unspecified abdominal location    Rx / DC Orders ED Discharge Orders          Ordered    ondansetron (ZOFRAN-ODT) 4 MG disintegrating tablet  Every 8 hours PRN        06/21/21 1726              Cheryll Cockayne, MD 06/21/21 1726

## 2021-06-21 NOTE — ED Triage Notes (Signed)
Patient complains of abdominal pain with vomiting since am. Reports that she fell yesterday and since has had pain and vomiting. States that she was mopping and hit head and back on chair and floor. Alert and oriented

## 2021-06-21 NOTE — Discharge Instructions (Signed)
Take Tylenol for any pains you have.  Try to avoid ibuprofen or Motrin.  Follow-up with your doctor this week.  If you have persistent vomiting take the medication as prescribed.

## 2021-06-21 NOTE — ED Provider Triage Note (Signed)
Emergency Medicine Provider Triage Evaluation Note  Nazly Digilio , a 36 y.o. female  was evaluated in triage.  Pt complains of back pain, abdominal pain, headache, neck pain since falling yesterday afternoon.  Patient reports she was mopping slipped and fell directly onto the floor hitting her head and neck on the base of the office chair.  Patient reports she did not lose consciousness however had to stay on the ground for a while because of dizziness.  Patient reports 5 episodes of emesis and severe headache today.  She reports on her last episode of emesis she also noticed blood-tinged sputum/emesis.  Denies dysuria.  Locates abdominal pain to right upper quadrant.  Review of Systems  Positive: As above Negative: As above  Physical Exam  BP 106/66    Pulse 63    Temp 97.8 F (36.6 C) (Oral)    Resp 14    SpO2 96%  Gen:   Awake, no distress   Resp:  Normal effort  MSK:   Moves extremities without difficulty  Other:  Diffuse abdominal tenderness.  Cervical spine tenderness present.  Diffuse paraspinal muscle tenderness.  Medical Decision Making  Medically screening exam initiated at 1:02 PM.  Appropriate orders placed.  Harriet Sutphen was informed that the remainder of the evaluation will be completed by another provider, this initial triage assessment does not replace that evaluation, and the importance of remaining in the ED until their evaluation is complete.     Marita Kansas, PA-C 06/21/21 1303

## 2021-07-17 NOTE — Progress Notes (Signed)
Referring Provider: Dettinger, Elige Radon, MD Primary Care Physician:  Dettinger, Elige Radon, MD Primary GI Physician: Dr. Marletta Lor  Chief Complaint  Patient presents with   Follow-up    Nauseated, diarrhea sometimes.     HPI:   Alicia Gilbert is a 36 y.o. female presenting today for follow-up of abdominal pain, GERD, and H. pylori.   Patient initially tested positive for H. pylori in February 2022 and was treated with metronidazole, clarithromycin, and PPI.  EGD completed 09/28/2020 revealing gastritis, biopsies positive for H. pylori.  She was treated with Pylera.  Again saw PCP in June 2022 with ongoing abdominal pain and intermittent nausea, treated with Pylera.  Prior PPIs have included omeprazole, pantoprazole, and Nexium most recently.  Last seen in our office 02/03/2021.  Reported ongoing intermittent, sharp, epigastric abdominal pain radiating into her chest at times with associated nausea and occasional vomiting.  Symptoms not triggered by meals, but felt food actually helped at times.  Reporting symptoms previously essentially resolved with extended course of Pylera, but later returned.  She was not taking a PPI as she wanted to wait until she saw [place] again in the office.  Noted prior ultrasound in April 2022 and prior CT in July 2022 with no significant findings.  Plan to complete H. pylori breath test.  Recommended avoiding all NSAIDs.  H. pylori breath test was positive on 02/11/2021.  After further discussion with patient, she reported with her first course of Pylera, she only took pantoprazole once daily.  With her second course of Pylera, she did not take pantoprazole at all.  Due to penicillin allergy, antibiotic regimens limited.  Recommended repeating Pylera with high-dose PPI.  After completing Pylera, continue pantoprazole twice daily.  She was evaluated in the emergency room on 06/21/2021 after having a fall the day prior.  Reported headache, episode of vomiting,  episode of dizziness, but no loss of consciousness.  Also reported mid to upper abdominal pain.  Denied diarrhea.  Multiple imaging studies ordered and did not reveal any sort of fracture or acute intracranial process.  CT A/P with contrast showed mild wall thickening and mesenteric stranding involving distal and terminal ileum suggesting infectious or inflammatory process.  No significant abnormalities on laboratory work-up.  Today:   Took all antibiotics for H. pylori with PPI BID. Abdominal pain and nausea has decreased, but still with symptoms. Not as persistent.  Also with intermittent epigastric/RUQ pain. Not triggered by meals, can be before or after, but occurs daily.  Also with nausea which actually seems to come on first, then abdominal pain. Intermittent vomiting. Last was 2 nights ago. No hematemesis, coffee-ground emesis, melena.  Still having reflux at times. 2-3 times a week.  Triggered by stress and tomatoes.  Not taking Pantoprazole, last took last Thursday. When she was taking daily, it did help with upper GI symptoms.   No NSAIDs.   She has also noticed that when she has more abdominal pain, she has diarrhea the following day.  In general, she has diarrhea 4 to 5 days a week with 4-5 watery bowel movements per day.  Also with nocturnal stools.  Reports stools are very smelly.  Denies hematochezia.  The couple days when she does not have diarrhea, she reports she passes normal, formed stools.  No antibiotics, travel, sick contacts. Had similar symptoms of diarrhea about 1 year ago, but stopped for several months, and now back.   Mother with history of "chronic colitis", but cannot tell  me if she has Crohn's or ulcerative colitis.  Her mother lives in [place].  States she does require a daily medication to control this.  No Fhx of colon cancer.   Past Medical History:  Diagnosis Date   H. pylori infection    S/p treatment with metronidazole, clarithromycin, and PPI February 2022,  treated with Prevpac May 2022, treated with 21-day course of Pylera in June 2022 without PPI, treated with Pylera with high dose PPI BID October 2022.   Heart murmur    childhood   Hypertension    Kidney infection     Past Surgical History:  Procedure Laterality Date   APPENDECTOMY     BIOPSY  09/28/2020   Procedure: BIOPSY;  Surgeon: Lanelle Bal, DO;  Location: [facility];  Service: Endoscopy;;   ESOPHAGOGASTRODUODENOSCOPY (EGD) WITH PROPOFOL N/A 09/28/2020   Surgeon: Lanelle Bal, DO; Gastritis biopsied, otherwise normal exam.  Recommend PPI twice daily and avoiding NSAIDs.  Pathology revealed chronic active gastritis with H. pylori.    Current Outpatient Medications  Medication Sig Dispense Refill   folic acid (FOLVITE) 1 MG tablet Take 1 mg by mouth daily.     Multiple Vitamins-Minerals (MULTIVITAMIN WITH MINERALS) tablet Take 1 tablet by mouth daily. Woman's     ondansetron (ZOFRAN-ODT) 4 MG disintegrating tablet Take 1 tablet (4 mg total) by mouth every 8 (eight) hours as needed for nausea or vomiting. 20 tablet 0   pantoprazole (PROTONIX) 40 MG tablet Take 1 tablet (40 mg total) by mouth 2 (two) times daily before a meal. (Patient not taking: Reported on 07/19/2021) 60 tablet 3   No current facility-administered medications for this visit.    Allergies as of 07/19/2021 - Review Complete 07/19/2021  Allergen Reaction Noted   Penicillins Hives and Rash 06/04/2013   Shellfish allergy Hives and Swelling 09/18/2020    Family History  Problem Relation Age of Onset   Heart disease Mother    Gastric cancer Mother    Breast cancer Mother    Colitis Mother    Heart disease Father    Colon cancer Neg Hx     Social History   Socioeconomic History   Marital status: Married    Spouse name: Not on file   Number of children: Not on file   Years of education: Not on file   Highest education level: Not on file  Occupational History   Not on file  Tobacco Use    Smoking status: Never   Smokeless tobacco: Never  Vaping Use   Vaping Use: Never used  Substance and Sexual Activity   Alcohol use: Never   Drug use: Never   Sexual activity: Yes    Birth control/protection: None, I.U.D.  Other Topics Concern   Not on file  Social History Narrative   ** Merged History Encounter **       Social Determinants of Health   Financial Resource Strain: Not on file  Food Insecurity: Not on file  Transportation Needs: Not on file  Physical Activity: Not on file  Stress: Not on file  Social Connections: Not on file    Review of Systems: Gen: Denies fever, chills, cold or flulike symptoms, presyncope, syncope. CV: Denies chest pain, palpitations. Resp: Denies dyspnea or cough. GI: See HPI Heme: See HPI  Physical Exam: BP 106/62    Pulse 66    Temp (!) 97.3 F (36.3 C) (Temporal)    Ht 5\' 4"  (1.626 m)    Wt  189 lb 3.2 oz (85.8 kg)    LMP 07/12/2021    BMI 32.48 kg/m  General:   Alert and oriented. No distress noted. Pleasant and cooperative.  Head:  Normocephalic and atraumatic. Eyes:  Conjuctiva clear without scleral icterus. Heart:  S1, S2 present without murmurs appreciated. Lungs:  Clear to auscultation bilaterally. No wheezes, rales, or rhonchi. No distress.  Abdomen:  +BS, soft, and non-distended. Mild TTP in RUQ, epigastric, and LUQ region. Moderate TTP in LLQ. No rebound or guarding. No HSM or masses noted. Msk:  Symmetrical without gross deformities. Normal posture. Extremities:  Without edema. Neurologic:  Alert and  oriented x4 Psych: Normal mood and affect.    Assessment: 36 year old female with history of GERD, H. pylori, presenting today for follow-up of GERD, H. pylori, epigastric abdominal pain.  Also note recent emergency room evaluation on 06/21/2021 for headache, vomiting, dizziness, upper abdominal pain s/p fall the day prior.  CT A/P with bowel wall thickening and mesenteric stranding involving distal and terminal ileum  suggesting infectious or inflammatory process.   H. Pylori:  Tested positive for H. pylori in February 2022 and was treated with metronidazole, clarithromycin, and PPI.  EGD completed 09/28/2020 revealing gastritis, biopsies positive for H. pylori.  She was treated with Pylera, but only took PPI once daily.  Due to ongoing abdominal pain and intermittent nausea, she was treated by PCP in June 2022 with an extended course of Pylera, but did not take PPI with this.  H. pylori breath test again positive on 02/13/2021.  Due to penicillin allergy, antibiotic regimen is limited, and opted to repeat Pylera with high-dose PPI.  Patient reports she did complete this regimen and continue Protonix twice daily until 3/9.  She continues with intermittent upper GI symptoms discussed below which were improved when she was taking Protonix twice daily.  We will have her continue to hold PPI for total of 14 days and repeat H. pylori breath test to confirm eradication.  GERD:  Improved, but still with symptoms 2-3 times a week, off PPI.  Often triggered by stress or tomato-based products.  We will have her continue to hold PPI for now as we are completing an H. pylori breath test in the near future.  After this, we will plan to resume PPI at least once daily.   Abdominal pain: History of epigastric/RUQ abdominal pain with associated nausea and intermittent vomiting thought to be secondary to GERD, gastritis, and H. pylori infection.  She has completed treatment for H. pylori as per above and notes improvement in her symptoms, but continues to have abdominal pain and nausea daily without specific trigger.  Intermittent vomiting.  Prior abdominal imaging including RUQ ultrasound April 2022 with normal exam, CT A/P x2 in 2022 with no acute GI or biliary findings.  However, now she is also experiencing watery, non-bloody, diarrhea 4 to 5 days a week with nocturnal stools with her abdominal pain. On exam, she is most tender in the  LLQ without rebound or guarding, also with mild TTP in LUQ, epigastric, and RUQ region.  Notably, CT A/P 06/21/2021 with bowel wall thickening and mesenteric stranding involving distal and terminal ileum. Patient tells me at that time, she was only having epigastric/RUQ pain at that time.  As her abdominal pain has worsened, become more diffuse, and with persistent diarrhea, I am concerned about infectious versus inflammatory colitis and cannot rule out diverticulitis.  We will plan to repeat CT, obtain stool studies, and plan to proceed  with colonoscopy in the near future as long as stool studies are negative.    Patient reports mother has history of "chronic colitis", resides in [place].  Diarrhea:  New onset watery, nonbloody diarrhea with associated nocturnal stools and abdominal pain x a couple of months.  CT A/P with contrast in February 2023 with mild wall thickening and mesenteric stranding involving the distal and terminal ileum.  Mother with history of "chronic colitis".  Planning for stool studies to rule out infectious diarrhea, inflammatory markers, CT A/P with contrast to evaluate worsening/more diffuse abdominal pain as discussed above.  Pending CT and stool studies, will likely proceed with colonoscopy in the near future.  Plan: CBC, CMP, CRP, sed rate, C. difficile GDH and toxin A/B, GI pathogen panel. CT A/P with contrast ASAP. H. pylori breath test no earlier than 07/30/2021.  She will continue to hold Protonix and avoid all acid suppression medications including Pepto-Bismol. She will avoid antibiotics as well or let me know if she has to start any.   If H. pylori breath test is negative, we will resume Protonix 40 mg daily for GERD. Refilled Zofran 4 mg every 8 hours as needed. Further recommendations to follow stool studies and CT.  She will need a colonoscopy in the near future.  Timing to be determined.    Ermalinda MemosKristen Toneisha Savary, PA-C [facility] 07/19/2021

## 2021-07-19 ENCOUNTER — Ambulatory Visit: Payer: Medicaid Other | Admitting: Gastroenterology

## 2021-07-19 ENCOUNTER — Telehealth: Payer: Self-pay

## 2021-07-19 ENCOUNTER — Other Ambulatory Visit: Payer: Self-pay

## 2021-07-19 ENCOUNTER — Encounter: Payer: Self-pay | Admitting: Gastroenterology

## 2021-07-19 VITALS — BP 106/62 | HR 66 | Temp 97.3°F | Ht 64.0 in | Wt 189.2 lb

## 2021-07-19 DIAGNOSIS — R112 Nausea with vomiting, unspecified: Secondary | ICD-10-CM

## 2021-07-19 DIAGNOSIS — R1084 Generalized abdominal pain: Secondary | ICD-10-CM

## 2021-07-19 DIAGNOSIS — A048 Other specified bacterial intestinal infections: Secondary | ICD-10-CM

## 2021-07-19 DIAGNOSIS — R933 Abnormal findings on diagnostic imaging of other parts of digestive tract: Secondary | ICD-10-CM

## 2021-07-19 DIAGNOSIS — R197 Diarrhea, unspecified: Secondary | ICD-10-CM

## 2021-07-19 DIAGNOSIS — K219 Gastro-esophageal reflux disease without esophagitis: Secondary | ICD-10-CM

## 2021-07-19 MED ORDER — ONDANSETRON 4 MG PO TBDP: 4.0000 mg | ORAL_TABLET | Freq: Three times a day (TID) | ORAL | 0 refills | Status: DC | PRN

## 2021-07-19 NOTE — Patient Instructions (Addendum)
English:  ?Please have blood work and stool studies completed at [facility]. ? ?You will also need to complete an H. pylori breath test at [facility], but this test should not be performed until 3/24 at the earliest.  Remember, you cannot have any acid suppression medications, Pepto-Bismol, or antibiotics prior to your H. pylori breath test. ? ?We will arrange for you to have a CT scan of your abdomen and pelvis at [facility]. ? ?We will call you with further recommendations pending review of blood work and CT. ? ?I have refilled your Zofran to help with nausea. ? ?Please let me know of any worsening symptoms. ? ?Ermalinda Memos, PA-C ?[facility] ? ? ?Spanish:  ?Realice an?lisis de [place] y estudios de Scientist, research (life sciences). ? ?Tambi?n deber? completar una prueba de aliento de H. pylori en [facility], pero esta prueba no debe realizarse hasta el 24/3 como m?nimo. Recuerde, no puede tomar ning?n medicamento para suprimir el ?cido, Pepto-Bismol o antibi?ticos antes de su prueba de aliento de H. pylori. ? ?Haremos los arreglos para que le hagan una tomograf?a computarizada de su abdomen y pelvis en [employer]. ? ?Lo llamaremos con m?s recomendaciones pendientes de revisi?n de an?lisis de sangre y tomograf?a computarizada. ? ?He recargado su Zofran para ayudar con las n?useas. ? ?Por favor, h?gamelo saber de cualquier empeoramiento de los s?ntomas. ? ?Ermalinda Memos, PA-C ?[facility] ? ? ?

## 2021-07-19 NOTE — Telephone Encounter (Signed)
Called pt via interpreter line and informed her of CT appt and details. Interpreter ID# [id number]. Appt letter mailed in spanish and [place]. ?

## 2021-07-19 NOTE — Telephone Encounter (Signed)
PA for CT abd/pelvis w/contrast submitted via [employer]. Order ID: [id number], valid 07/19/21-09/16/21.  ? ?CT abd/pelvis w/contrast scheduled for 07/26/21 at 8:00am, arrive at 7:45am. NPO for 4 hours before test. Pick up contrast. ?

## 2021-07-19 NOTE — Telephone Encounter (Signed)
Tried to call pt via interpreter line Merril Abbe, ID# [phone] inform her of CT appt, voicemail box full. ?

## 2021-07-20 LAB — CBC
HCT: 36.4 % (ref 35.0–45.0)
Hemoglobin: 12.1 g/dL (ref 11.7–15.5)
MCH: 30.9 pg (ref 27.0–33.0)
MCHC: 33.2 g/dL (ref 32.0–36.0)
MCV: 93.1 fL (ref 80.0–100.0)
MPV: 9.7 fL (ref 7.5–12.5)
Platelets: 246 10*3/uL (ref 140–400)
RBC: 3.91 10*6/uL (ref 3.80–5.10)
RDW: 12.4 % (ref 11.0–15.0)
WBC: 4.7 10*3/uL (ref 3.8–10.8)

## 2021-07-20 LAB — COMPLETE METABOLIC PANEL WITH GFR
AG Ratio: 1.7 (calc) (ref 1.0–2.5)
ALT: 9 U/L (ref 6–29)
AST: 12 U/L (ref 10–30)
Albumin: 4.3 g/dL (ref 3.6–5.1)
Alkaline phosphatase (APISO): 52 U/L (ref 31–125)
BUN: 12 mg/dL (ref 7–25)
CO2: 26 mmol/L (ref 20–32)
Calcium: 8.9 mg/dL (ref 8.6–10.2)
Chloride: 105 mmol/L (ref 98–110)
Creat: 0.62 mg/dL (ref 0.50–0.97)
Globulin: 2.6 g/dL (calc) (ref 1.9–3.7)
Glucose, Bld: 89 mg/dL (ref 65–99)
Potassium: 3.6 mmol/L (ref 3.5–5.3)
Sodium: 138 mmol/L (ref 135–146)
Total Bilirubin: 0.4 mg/dL (ref 0.2–1.2)
Total Protein: 6.9 g/dL (ref 6.1–8.1)
eGFR: 120 mL/min/{1.73_m2} (ref >=60)

## 2021-07-20 LAB — C-REACTIVE PROTEIN: CRP: 2.2 mg/L (ref <8.0)

## 2021-07-20 LAB — SEDIMENTATION RATE: Sed Rate: 6 mm/h (ref 0–20)

## 2021-07-26 ENCOUNTER — Ambulatory Visit (HOSPITAL_COMMUNITY)
Admission: RE | Admit: 2021-07-26 | Discharge: 2021-07-26 | Disposition: A | Discharge status: Home / Self Care | Payer: Medicaid Other | Source: Ambulatory Visit | Attending: Gastroenterology | Admitting: Gastroenterology

## 2021-07-26 ENCOUNTER — Other Ambulatory Visit: Payer: Self-pay

## 2021-07-26 MED ORDER — BARIUM SULFATE 2 % PO SUSP
ORAL | Status: AC
  Filled 2021-07-26: qty 2

## 2021-07-28 ENCOUNTER — Other Ambulatory Visit: Payer: Self-pay

## 2021-07-28 ENCOUNTER — Ambulatory Visit (HOSPITAL_COMMUNITY)
Admission: RE | Admit: 2021-07-28 | Discharge: 2021-07-28 | Disposition: A | Discharge status: Home / Self Care | Payer: Medicaid Other | Source: Ambulatory Visit | Attending: Gastroenterology | Admitting: Gastroenterology

## 2021-07-28 ENCOUNTER — Encounter (HOSPITAL_COMMUNITY): Payer: Self-pay

## 2021-07-28 DIAGNOSIS — R1084 Generalized abdominal pain: Secondary | ICD-10-CM | POA: Diagnosis present

## 2021-07-28 DIAGNOSIS — R197 Diarrhea, unspecified: Secondary | ICD-10-CM | POA: Insufficient documentation

## 2021-07-28 DIAGNOSIS — R112 Nausea with vomiting, unspecified: Secondary | ICD-10-CM | POA: Insufficient documentation

## 2021-07-28 MED ORDER — IOHEXOL 300 MG/ML  SOLN
100.0000 mL | Freq: Once | INTRAMUSCULAR | Status: AC | PRN
  Administered 2021-07-28: 100 mL via INTRAVENOUS

## 2021-07-29 ENCOUNTER — Encounter: Payer: Self-pay | Admitting: *Deleted

## 2021-08-02 ENCOUNTER — Other Ambulatory Visit: Payer: Self-pay | Admitting: *Deleted

## 2021-08-02 DIAGNOSIS — R197 Diarrhea, unspecified: Secondary | ICD-10-CM

## 2021-08-02 DIAGNOSIS — R933 Abnormal findings on diagnostic imaging of other parts of digestive tract: Secondary | ICD-10-CM

## 2021-08-02 DIAGNOSIS — R1084 Generalized abdominal pain: Secondary | ICD-10-CM

## 2021-08-03 LAB — C. DIFFICILE GDH AND TOXIN A/B
GDH ANTIGEN: NOT DETECTED
MICRO NUMBER:: 13178589
SPECIMEN QUALITY:: ADEQUATE
TOXIN A AND B: NOT DETECTED

## 2021-08-03 LAB — H. PYLORI BREATH TEST: H. pylori Breath Test: DETECTED — AB

## 2021-08-12 ENCOUNTER — Other Ambulatory Visit: Payer: Self-pay | Admitting: *Deleted

## 2021-08-12 ENCOUNTER — Other Ambulatory Visit: Payer: Self-pay | Admitting: Gastroenterology

## 2021-08-12 DIAGNOSIS — A048 Other specified bacterial intestinal infections: Secondary | ICD-10-CM

## 2021-08-12 DIAGNOSIS — R197 Diarrhea, unspecified: Secondary | ICD-10-CM

## 2021-08-12 DIAGNOSIS — K219 Gastro-esophageal reflux disease without esophagitis: Secondary | ICD-10-CM

## 2021-08-12 MED ORDER — PANTOPRAZOLE SODIUM 40 MG PO TBEC: 40.0000 mg | DELAYED_RELEASE_TABLET | Freq: Every day | ORAL | 1 refills | Status: DC

## 2021-08-12 NOTE — Addendum Note (Signed)
Addended by: Faythe Dingwall on: 08/12/2021 11:23 AM ? ? Modules accepted: Orders ? ?

## 2021-08-20 ENCOUNTER — Other Ambulatory Visit: Payer: Self-pay | Admitting: *Deleted

## 2021-08-20 DIAGNOSIS — R197 Diarrhea, unspecified: Secondary | ICD-10-CM

## 2021-08-23 NOTE — Progress Notes (Signed)
Noted  

## 2021-08-24 LAB — GIARDIA ANTIGEN
MICRO NUMBER:: 13237309
RESULT:: NOT DETECTED
SPECIMEN QUALITY:: ADEQUATE

## 2021-08-24 LAB — CRYPTOSPORIDIUM ANTIGEN, EIA
Specimen Quality:: ADEQUATE
micro Number:: 13272112

## 2021-08-24 LAB — TEST AUTHORIZATION 2

## 2021-08-26 ENCOUNTER — Encounter: Payer: Self-pay | Admitting: Internal Medicine

## 2021-08-26 ENCOUNTER — Other Ambulatory Visit: Payer: Self-pay | Admitting: *Deleted

## 2021-08-26 ENCOUNTER — Telehealth: Payer: Self-pay | Admitting: *Deleted

## 2021-08-26 DIAGNOSIS — R197 Diarrhea, unspecified: Secondary | ICD-10-CM

## 2021-08-26 DIAGNOSIS — Z8619 Personal history of other infectious and parasitic diseases: Secondary | ICD-10-CM

## 2021-08-26 NOTE — Telephone Encounter (Addendum)
Noted.  As diarrhea has resolved, no need to complete stool studies.  We will have her follow-up in the office in 3 months.  ? ?Alicia Gilbert: Please arrange 80-month follow-up  ?

## 2021-08-26 NOTE — Addendum Note (Signed)
Addended by: Faythe Dingwall on: 08/26/2021 10:19 AM ? ? Modules accepted: Orders ? ?

## 2021-08-26 NOTE — Telephone Encounter (Signed)
Spanish interpreter Jasser [phone]) called and spoke to pt, informed pt we needed another stool study done. Pt stated she is not having diarrhea anymore.  ?

## 2021-08-26 NOTE — Telephone Encounter (Signed)
Noted  

## 2021-09-19 ENCOUNTER — Emergency Department (HOSPITAL_BASED_OUTPATIENT_CLINIC_OR_DEPARTMENT_OTHER)
Admission: EM | Admit: 2021-09-19 | Discharge: 2021-09-19 | Disposition: A | Discharge status: Home / Self Care | Payer: Medicaid Other | Attending: Emergency Medicine | Admitting: Emergency Medicine

## 2021-09-19 ENCOUNTER — Encounter (HOSPITAL_BASED_OUTPATIENT_CLINIC_OR_DEPARTMENT_OTHER): Payer: Self-pay

## 2021-09-19 ENCOUNTER — Other Ambulatory Visit: Payer: Self-pay

## 2021-09-19 DIAGNOSIS — R109 Unspecified abdominal pain: Secondary | ICD-10-CM | POA: Diagnosis present

## 2021-09-19 DIAGNOSIS — I10 Essential (primary) hypertension: Secondary | ICD-10-CM | POA: Insufficient documentation

## 2021-09-19 DIAGNOSIS — N12 Tubulo-interstitial nephritis, not specified as acute or chronic: Secondary | ICD-10-CM | POA: Insufficient documentation

## 2021-09-19 LAB — URINALYSIS, ROUTINE W REFLEX MICROSCOPIC
Bilirubin Urine: NEGATIVE
Glucose, UA: NEGATIVE mg/dL
Ketones, ur: NEGATIVE mg/dL
Nitrite: NEGATIVE
Protein, ur: 100 mg/dL — AB
Specific Gravity, Urine: 1.016 (ref 1.005–1.030)
WBC, UA: >50 WBC/hpf — ABNORMAL HIGH (ref 0–5)
pH: 6.5 (ref 5.0–8.0)

## 2021-09-19 LAB — PREGNANCY, URINE: Preg Test, Ur: NEGATIVE

## 2021-09-19 MED ORDER — ONDANSETRON 4 MG PO TBDP: 4.0000 mg | ORAL_TABLET | Freq: Three times a day (TID) | ORAL | 0 refills | Status: AC | PRN

## 2021-09-19 MED ORDER — KETOROLAC TROMETHAMINE 60 MG/2ML IM SOLN
30.0000 mg | Freq: Once | INTRAMUSCULAR | Status: AC
  Administered 2021-09-19: 30 mg via INTRAMUSCULAR
  Filled 2021-09-19: qty 2

## 2021-09-19 MED ORDER — SULFAMETHOXAZOLE-TRIMETHOPRIM 800-160 MG PO TABS
1.0000 | ORAL_TABLET | Freq: Once | ORAL | Status: AC
  Administered 2021-09-19: 1 via ORAL
  Filled 2021-09-19: qty 1

## 2021-09-19 MED ORDER — SULFAMETHOXAZOLE-TRIMETHOPRIM 800-160 MG PO TABS: 1.0000 | ORAL_TABLET | Freq: Two times a day (BID) | ORAL | 0 refills | Status: AC

## 2021-09-19 NOTE — ED Triage Notes (Signed)
Spanish interpreter used for triage.  ? ?Right sided flank pain, fever and dysuria x 5 days.  ? ?Took ibuprofen at 0300.  ?

## 2021-09-19 NOTE — ED Provider Notes (Signed)
?[facility] EMERGENCY DEPT ?Provider Note ? ?CSN: [id number] ?Arrival date & time: 09/19/21 [id number] ? ?Chief Complaint(s) ?Flank Pain ? ?HPI ?Alicia Gilbert is a 36 y.o. female with a past medical history listed below including previous urinary tract infections and pyelonephritis here for 6 days of gradually worsening dysuria and right flank pain.  She denies any fevers or chills.  Denies any nausea or vomiting.  No other physical complaints. ? ? ?Flank Pain ? ? ?Past Medical History ?Past Medical History:  ?Diagnosis Date  ? H. pylori infection   ? S/p treatment with metronidazole, clarithromycin, and PPI February 2022, treated with Prevpac May 2022, treated with 21-day course of Pylera in June 2022 without PPI, treated with Pylera with high dose PPI BID October 2022.  ? Heart murmur   ? childhood  ? Hypertension   ? Kidney infection   ? ?Patient Active Problem List  ? Diagnosis Date Noted  ? Nausea and vomiting 07/19/2021  ? Diarrhea 07/19/2021  ? Generalized abdominal pain 07/19/2021  ? H. pylori infection 07/19/2021  ? Abnormal CT scan, small bowel 07/19/2021  ? Chest pain 02/03/2021  ? LLQ pain 02/03/2021  ? Obesity (BMI 30-39.9) 11/16/2020  ? Hypokalemia 11/15/2020  ? Pyelonephritis of right kidney 11/14/2020  ? Abdominal pain, epigastric 08/19/2020  ? Gastroesophageal reflux disease 08/19/2020  ? Family history of gastric cancer 08/19/2020  ? History of Helicobacter pylori infection 08/19/2020  ? DOE and chronic cough ? etiology  07/14/2020  ? Supervision of other normal pregnancy, antepartum 06/07/2019  ? Vaginal discharge during pregnancy, antepartum 06/07/2019  ? Language barrier 06/07/2019  ? Abdominal pain affecting pregnancy 05/27/2019  ? ?Home Medication(s) ?Prior to Admission medications   ?Medication Sig Start Date End Date Taking? Authorizing Provider  ?ondansetron (ZOFRAN-ODT) 4 MG disintegrating tablet Take 1 tablet (4 mg total) by mouth every 8 (eight) hours as needed for up to  3 days for nausea or vomiting. 09/19/21 09/22/21 Yes Hiyab Nhem, Amadeo GarnetPedro Eduardo, MD  ?sulfamethoxazole-trimethoprim (BACTRIM DS) 800-160 MG tablet Take 1 tablet by mouth 2 (two) times daily for 14 days. 09/19/21 10/03/21 Yes Emileigh Kellett, Amadeo GarnetPedro Eduardo, MD  ?folic acid (FOLVITE) 1 MG tablet Take 1 mg by mouth daily.    [provider]  ?Multiple Vitamins-Minerals (MULTIVITAMIN WITH MINERALS) tablet Take 1 tablet by mouth daily. [facility]    [provider]  ?pantoprazole (PROTONIX) 40 MG tablet Take 1 tablet (40 mg total) by mouth daily before breakfast. 08/12/21 08/12/22  Letta MedianHarper, Kristen S, PA-C  ?                                                                                                                                  ?Allergies ?Penicillins and Shellfish allergy ? ?Review of Systems ?Review of Systems  ?Genitourinary:  Positive for flank pain.  ?As noted in HPI ? ?Physical Exam ?Vital Signs  ?I have reviewed the triage vital signs ?BP 100/61  Pulse 66   Temp 98.1 ?F (36.7 ?C)   Resp 18   Ht 5\' 4"  (1.626 m)   Wt 83.9 kg   LMP 08/26/2021 (Approximate)   SpO2 100%   BMI 31.76 kg/m?  ? ?Physical Exam ?Vitals reviewed.  ?Constitutional:   ?   General: She is not in acute distress. ?   Appearance: She is well-developed. She is not diaphoretic.  ?HENT:  ?   Head: Normocephalic and atraumatic.  ?   Right Ear: External ear normal.  ?   Left Ear: External ear normal.  ?   Nose: Nose normal.  ?Eyes:  ?   General: No scleral icterus. ?   Conjunctiva/sclera: Conjunctivae normal.  ?Neck:  ?   Trachea: Phonation normal.  ?Cardiovascular:  ?   Rate and Rhythm: Normal rate and regular rhythm.  ?Pulmonary:  ?   Effort: Pulmonary effort is normal. No respiratory distress.  ?   Breath sounds: No stridor.  ?Abdominal:  ?   General: There is no distension.  ?   Tenderness: There is abdominal tenderness in the suprapubic area. There is right CVA tenderness.  ?Musculoskeletal:     ?   General: Normal range of motion.  ?    Cervical back: Normal range of motion.  ?Neurological:  ?   Mental Status: She is alert and oriented to person, place, and time.  ?Psychiatric:     ?   Behavior: Behavior normal.  ? ? ?ED Results and Treatments ?Labs ?(all labs ordered are listed, but only abnormal results are displayed) ?Labs Reviewed  ?URINALYSIS, ROUTINE W REFLEX MICROSCOPIC - Abnormal; Notable for the following components:  ?    Result Value  ? APPearance CLOUDY (*)   ? Hgb urine dipstick MODERATE (*)   ? Protein, ur 100 (*)   ? Leukocytes,Ua LARGE (*)   ? WBC, UA >50 (*)   ? Bacteria, UA FEW (*)   ? Non Squamous Epithelial 0-5 (*)   ? All other components within normal limits  ?PREGNANCY, URINE  ?                                                                                                                       ?EKG ? EKG Interpretation ? ?Date/Time:    ?Ventricular Rate:    ?PR Interval:    ?QRS Duration:   ?QT Interval:    ?QTC Calculation:   ?R Axis:     ?Text Interpretation:   ?  ? ?  ? ?Radiology ?No results found. ? ?Pertinent labs & imaging results that were available during my care of the patient were reviewed by me and considered in my medical decision making (see MDM for details). ? ?Medications Ordered in ED ?Medications  ?ketorolac (TORADOL) injection 30 mg (30 mg Intramuscular Given 09/19/21 0640)  ?sulfamethoxazole-trimethoprim (BACTRIM DS) 800-160 MG per tablet 1 tablet (1 tablet Oral Given 09/19/21 0640)  ?                                                               ?                                                                    ?  Procedures ?Procedures ? ?(including critical care time) ? ?Medical Decision Making / ED Course ? ? ? Complexity of Problem: ? ?Patient's presenting problem/concern, DDX, and MDM listed below: ?Flank pain and dysuria ?Most suspicious for urinary tract infection/pyelonephritis ?We will obtain pregnancy test to assist in care ?Presentation is less concerning for other serious intra-abdominal  inflammatory/infectious process including appendicitis, colitis, etc. however will reconsider if UA does not suggest infection ? ? ? ?  Complexity of Data: ?  ?Laboratory Tests ordered listed below with my independent interpretation: ?UA is consistent with urinary tract infection ?Urine pregnancy negative ?  ?Imaging Studies ordered listed below with my independent interpretation: ?Not indicated ?  ?  ?ED Course:   ? ?Assessment, Add'l Intervention, and Reassessment: ?Pyelonephritis ?Patient is afebrile, well-appearing and able to tolerate oral intake. ?She does not require admission at this time ?We will treat with Bactrim.  ? ? ?Final Clinical Impression(s) / ED Diagnoses ?Final diagnoses:  ?Pyelonephritis  ? ?The patient appears reasonably screened and/or stabilized for discharge and I doubt any other medical condition or other [facility] requiring further screening, evaluation, or treatment in the ED at this time prior to discharge. Safe for discharge with strict return precautions. ? ?Disposition: Discharge ? ?Condition: Good ? ?I have discussed the results, Dx and Tx plan with the patient/family who expressed understanding and agree(s) with the plan. Discharge instructions discussed at length. The patient/family was given strict return precautions who verbalized understanding of the instructions. No further questions at time of discharge.  ? ? ?ED Discharge Orders   ? ?      Ordered  ?  sulfamethoxazole-trimethoprim (BACTRIM DS) 800-160 MG tablet  2 times daily       ? 09/19/21 0648  ?  ondansetron (ZOFRAN-ODT) 4 MG disintegrating tablet  Every 8 hours PRN       ? 09/19/21 0648  ? ?  ?  ? ?  ? ? ?Follow Up: ?Dettinger, Fransisca Kaufmann, MD ?[address] ?[place] [address] ?[phone] ? ?Call  ?as needed ? ? ? ? ?  ? ? ? ? ? ?This chart was dictated using voice recognition software.  Despite best efforts to proofread,  errors can occur which can change the documentation meaning. ? ?  ?Fatima Blank, MD ?09/19/21  [phone] ? ?

## 2021-09-19 NOTE — ED Notes (Signed)
Pt agreeable with d/c plan as discussed by provider- this nurse has verbally reinforced d/c instructions and provided pt with written copy - pt acknowledges verbal understanding and denies any additional questions, concerns, needs- ambulatory independently with steady gait at d/c - no distress; vitals stable.   ?

## 2021-09-22 ENCOUNTER — Encounter: Payer: Self-pay | Admitting: *Deleted

## 2021-10-22 ENCOUNTER — Encounter: Payer: Medicaid Other | Admitting: Family Medicine

## 2021-11-03 ENCOUNTER — Ambulatory Visit: Payer: Medicaid Other | Admitting: Family Medicine

## 2021-11-03 ENCOUNTER — Encounter: Payer: Self-pay | Admitting: Family Medicine

## 2021-11-03 ENCOUNTER — Other Ambulatory Visit (HOSPITAL_COMMUNITY)
Admission: RE | Admit: 2021-11-03 | Discharge: 2021-11-03 | Disposition: A | Discharge status: Home / Self Care | Payer: Medicaid Other | Source: Ambulatory Visit | Attending: Family Medicine | Admitting: Family Medicine

## 2021-11-03 VITALS — BP 103/70 | HR 56 | Temp 98.2°F | Ht 64.0 in | Wt 186.0 lb

## 2021-11-03 DIAGNOSIS — Z0000 Encounter for general adult medical examination without abnormal findings: Secondary | ICD-10-CM

## 2021-11-03 DIAGNOSIS — Z0001 Encounter for general adult medical examination with abnormal findings: Secondary | ICD-10-CM | POA: Diagnosis not present

## 2021-11-03 DIAGNOSIS — R87810 Cervical high risk human papillomavirus (HPV) DNA test positive: Secondary | ICD-10-CM | POA: Insufficient documentation

## 2021-11-03 NOTE — Progress Notes (Signed)
BP 103/70   Pulse (!) 56   Temp 98.2 F (36.8 C)   Ht _0  (1.626 m)   Wt 186 lb (84.4 kg)   SpO2 97%   BMI 31.93 kg/m    Subjective:   Patient ID: Alicia Gilbert, female    DOB: 03-01-1986, 36 y.o.   MRN: [id number]  HPI: Alicia Gilbert is a 36 y.o. female presenting on 11/03/2021 for Medical Management of Chronic Issues (CPE- no pap/breast exam only)   HPI Physical exam and Pap smear Patient denies any chest pain, shortness of breath, headaches or vision issues, diarrhea, nausea, vomiting, or joint issues.  Patient denies any breast lumps or bumps or nodules.  She does still get heartburn and indigestion in the center of her chest but not actually problems with any breast issues.  She does have some lower abdominal irritation and some vaginal discharge but does not think it is much more than what she normally has.  Patient is still breast-feeding her 59-year-old child.  Relevant past medical, surgical, family and social history reviewed and updated as indicated. Interim medical history since our last visit reviewed. Allergies and medications reviewed and updated.  Review of Systems  Constitutional:  Negative for chills and fever.  Respiratory:  Negative for cough, shortness of breath and wheezing.   Cardiovascular:  Negative for chest pain, palpitations and leg swelling.  Gastrointestinal:  Negative for abdominal pain, blood in stool, constipation and diarrhea.  Genitourinary:  Negative for dysuria and hematuria.  Musculoskeletal:  Negative for back pain and myalgias.  Skin:  Negative for rash.  Neurological:  Negative for dizziness, weakness and headaches.  Psychiatric/Behavioral:  Positive for dysphoric mood. Negative for self-injury, sleep disturbance and suicidal ideas. The patient is nervous/anxious.     Per HPI unless specifically indicated above   Allergies as of 11/03/2021       Reactions   Penicillins Hives, Rash   Reaction: Childhood   Shellfish  Allergy Hives, Swelling        Medication List        Accurate as of November 03, 2021 11:32 AM. If you have any questions, ask your nurse or doctor.          folic acid 1 MG tablet Commonly known as: FOLVITE Take 1 mg by mouth daily.   multivitamin with minerals tablet Take 1 tablet by mouth daily. [facility]   pantoprazole 40 MG tablet Commonly known as: Protonix Take 1 tablet (40 mg total) by mouth daily before breakfast.         Objective:   BP 103/70   Pulse (!) 56   Temp 98.2 F (36.8 C)   Ht _1  (1.626 m)   Wt 186 lb (84.4 kg)   SpO2 97%   BMI 31.93 kg/m   Wt Readings from Last 3 Encounters:  11/03/21 186 lb (84.4 kg)  09/19/21 185 lb (83.9 kg)  07/19/21 189 lb 3.2 oz (85.8 kg)    Physical Exam Vitals and nursing note reviewed. Exam conducted with a chaperone present.  Constitutional:      General: She is not in acute distress.    Appearance: Normal appearance. She is well-developed. She is not diaphoretic.  Eyes:     Conjunctiva/sclera: Conjunctivae normal.  Neck:     Thyroid: No thyromegaly.  Cardiovascular:     Rate and Rhythm: Normal rate and regular rhythm.     Heart sounds: Normal heart sounds. No murmur heard. Pulmonary:  Effort: Pulmonary effort is normal. No respiratory distress.     Breath sounds: Normal breath sounds. No wheezing.  Chest:  Breasts:    Breasts are symmetrical.     Right: No inverted nipple, mass, nipple discharge, skin change or tenderness.     Left: No inverted nipple, mass, nipple discharge, skin change or tenderness.  Abdominal:     General: Bowel sounds are normal. There is no distension.     Palpations: Abdomen is soft.     Tenderness: There is abdominal tenderness in the suprapubic area. There is no guarding or rebound.  Genitourinary:    Exam position: Lithotomy position.     Labia:        Right: No rash or lesion.        Left: No rash or lesion.      Vagina: Normal.     Cervix: Discharge (Thin white  discharge) present. No cervical motion tenderness or friability.     Uterus: Not deviated, not enlarged, not fixed and not tender.      Adnexa:        Right: No mass or tenderness.         Left: No mass or tenderness.    Musculoskeletal:        General: No swelling. Normal range of motion.     Cervical back: Neck supple.  Lymphadenopathy:     Cervical: No cervical adenopathy.  Skin:    General: Skin is warm and dry.     Findings: No rash.  Neurological:     Mental Status: She is alert and oriented to person, place, and time.     Coordination: Coordination normal.  Psychiatric:        Behavior: Behavior normal.       Assessment & Plan:   Problem List Items Addressed This Visit   None Visit Diagnoses     Physical exam    -  Primary   Relevant Orders   CBC with Differential/Platelet   CMP14+EGFR   Lipid panel   Cytology - PAP([facility])   Cervical high risk HPV (human papillomavirus) test positive       Relevant Orders   Cytology - PAP([facility])     We will do blood work for patient, will send off the Pap smear as well.  She did have a little bit of discharge and will see if anything comes back with a Pap smear.  She continues to see gastroenterology.  Follow up plan: Return in about 1 year (around 11/04/2022), or if symptoms worsen or fail to improve, for well physical, pap.  Counseling provided for all of the vaccine components Orders Placed This Encounter  Procedures   CBC with Differential/Platelet   CMP14+EGFR   Lipid panel    Caryl Pina, MD [facility] 11/03/2021, 11:32 AM

## 2021-11-04 LAB — CBC WITH DIFFERENTIAL/PLATELET
Basophils Absolute: 0 10*3/uL (ref 0.0–0.2)
Basos: 1 %
EOS (ABSOLUTE): 0 10*3/uL (ref 0.0–0.4)
Eos: 1 %
Hematocrit: 37.3 % (ref 34.0–46.6)
Hemoglobin: 12.2 g/dL (ref 11.1–15.9)
Immature Grans (Abs): 0 10*3/uL (ref 0.0–0.1)
Immature Granulocytes: 0 %
Lymphocytes Absolute: 1.8 10*3/uL (ref 0.7–3.1)
Lymphs: 35 %
MCH: 30.9 pg (ref 26.6–33.0)
MCHC: 32.7 g/dL (ref 31.5–35.7)
MCV: 94 fL (ref 79–97)
Monocytes Absolute: 0.4 10*3/uL (ref 0.1–0.9)
Monocytes: 7 %
Neutrophils Absolute: 2.8 10*3/uL (ref 1.4–7.0)
Neutrophils: 56 %
Platelets: 275 10*3/uL (ref 150–450)
RBC: 3.95 x10E6/uL (ref 3.77–5.28)
RDW: 12.3 % (ref 11.7–15.4)
WBC: 5 10*3/uL (ref 3.4–10.8)

## 2021-11-04 LAB — CMP14+EGFR
ALT: 15 IU/L (ref 0–32)
AST: 15 IU/L (ref 0–40)
Albumin/Globulin Ratio: 1.6 (ref 1.2–2.2)
Albumin: 4.5 g/dL (ref 3.8–4.8)
Alkaline Phosphatase: 60 IU/L (ref 44–121)
BUN/Creatinine Ratio: 17 (ref 9–23)
BUN: 12 mg/dL (ref 6–20)
Bilirubin Total: 0.2 mg/dL (ref 0.0–1.2)
CO2: 24 mmol/L (ref 20–29)
Calcium: 8.8 mg/dL (ref 8.7–10.2)
Chloride: 103 mmol/L (ref 96–106)
Creatinine, Ser: 0.7 mg/dL (ref 0.57–1.00)
Globulin, Total: 2.9 g/dL (ref 1.5–4.5)
Glucose: 97 mg/dL (ref 70–99)
Potassium: 4 mmol/L (ref 3.5–5.2)
Sodium: 140 mmol/L (ref 134–144)
Total Protein: 7.4 g/dL (ref 6.0–8.5)
eGFR: 116 mL/min/{1.73_m2} (ref >59)

## 2021-11-04 LAB — LIPID PANEL
Chol/HDL Ratio: 3.5 ratio (ref 0.0–4.4)
Cholesterol, Total: 197 mg/dL (ref 100–199)
HDL: 57 mg/dL (ref >39)
LDL Chol Calc (NIH): 120 mg/dL — ABNORMAL HIGH (ref 0–99)
Triglycerides: 113 mg/dL (ref 0–149)
VLDL Cholesterol Cal: 20 mg/dL (ref 5–40)

## 2021-11-05 LAB — CYTOLOGY - PAP
Comment: NEGATIVE
Diagnosis: NEGATIVE
Diagnosis: REACTIVE
High risk HPV: NEGATIVE

## 2021-11-28 ENCOUNTER — Emergency Department (HOSPITAL_BASED_OUTPATIENT_CLINIC_OR_DEPARTMENT_OTHER)
Admission: EM | Admit: 2021-11-28 | Discharge: 2021-11-28 | Disposition: A | Discharge status: Home / Self Care | Payer: Medicaid Other | Attending: Emergency Medicine | Admitting: Emergency Medicine

## 2021-11-28 ENCOUNTER — Other Ambulatory Visit: Payer: Self-pay

## 2021-11-28 ENCOUNTER — Encounter (HOSPITAL_BASED_OUTPATIENT_CLINIC_OR_DEPARTMENT_OTHER): Payer: Self-pay | Admitting: Obstetrics and Gynecology

## 2021-11-28 DIAGNOSIS — I10 Essential (primary) hypertension: Secondary | ICD-10-CM | POA: Diagnosis not present

## 2021-11-28 DIAGNOSIS — Z20822 Contact with and (suspected) exposure to covid-19: Secondary | ICD-10-CM | POA: Diagnosis not present

## 2021-11-28 DIAGNOSIS — N12 Tubulo-interstitial nephritis, not specified as acute or chronic: Secondary | ICD-10-CM | POA: Diagnosis not present

## 2021-11-28 DIAGNOSIS — R519 Headache, unspecified: Secondary | ICD-10-CM | POA: Diagnosis not present

## 2021-11-28 DIAGNOSIS — R5383 Other fatigue: Secondary | ICD-10-CM | POA: Insufficient documentation

## 2021-11-28 DIAGNOSIS — R509 Fever, unspecified: Secondary | ICD-10-CM | POA: Diagnosis present

## 2021-11-28 LAB — COMPREHENSIVE METABOLIC PANEL WITH GFR
ALT: 8 U/L (ref 0–44)
AST: 12 U/L — ABNORMAL LOW (ref 15–41)
Albumin: 4.5 g/dL (ref 3.5–5.0)
Alkaline Phosphatase: 46 U/L (ref 38–126)
Anion gap: 11 (ref 5–15)
BUN: 11 mg/dL (ref 6–20)
CO2: 25 mmol/L (ref 22–32)
Calcium: 9.4 mg/dL (ref 8.9–10.3)
Chloride: 102 mmol/L (ref 98–111)
Creatinine, Ser: 0.66 mg/dL (ref 0.44–1.00)
GFR, Estimated: >60 mL/min
Glucose, Bld: 113 mg/dL — ABNORMAL HIGH (ref 70–99)
Potassium: 3.8 mmol/L (ref 3.5–5.1)
Sodium: 138 mmol/L (ref 135–145)
Total Bilirubin: 0.4 mg/dL (ref 0.3–1.2)
Total Protein: 7.7 g/dL (ref 6.5–8.1)

## 2021-11-28 LAB — CBC WITH DIFFERENTIAL/PLATELET
Abs Immature Granulocytes: 0.02 K/uL (ref 0.00–0.07)
Basophils Absolute: 0 K/uL (ref 0.0–0.1)
Basophils Relative: 1 %
Eosinophils Absolute: 0 K/uL (ref 0.0–0.5)
Eosinophils Relative: 0 %
HCT: 36.5 % (ref 36.0–46.0)
Hemoglobin: 12.3 g/dL (ref 12.0–15.0)
Immature Granulocytes: 0 %
Lymphocytes Relative: 21 %
Lymphs Abs: 1.5 K/uL (ref 0.7–4.0)
MCH: 30.9 pg (ref 26.0–34.0)
MCHC: 33.7 g/dL (ref 30.0–36.0)
MCV: 91.7 fL (ref 80.0–100.0)
Monocytes Absolute: 0.7 K/uL (ref 0.1–1.0)
Monocytes Relative: 9 %
Neutro Abs: 5.1 K/uL (ref 1.7–7.7)
Neutrophils Relative %: 69 %
Platelets: 219 K/uL (ref 150–400)
RBC: 3.98 MIL/uL (ref 3.87–5.11)
RDW: 12.9 % (ref 11.5–15.5)
WBC: 7.4 K/uL (ref 4.0–10.5)
nRBC: 0 % (ref 0.0–0.2)

## 2021-11-28 LAB — URINALYSIS, ROUTINE W REFLEX MICROSCOPIC
Bilirubin Urine: NEGATIVE
Glucose, UA: NEGATIVE mg/dL
Ketones, ur: NEGATIVE mg/dL
Nitrite: NEGATIVE
Protein, ur: NEGATIVE mg/dL
Specific Gravity, Urine: 1.016 (ref 1.005–1.030)
pH: 7 (ref 5.0–8.0)

## 2021-11-28 LAB — SARS CORONAVIRUS 2 BY RT PCR: SARS Coronavirus 2 by RT PCR: NEGATIVE

## 2021-11-28 LAB — PREGNANCY, URINE: Preg Test, Ur: NEGATIVE

## 2021-11-28 MED ORDER — CEPHALEXIN 500 MG PO CAPS: 500.0000 mg | ORAL_CAPSULE | Freq: Four times a day (QID) | ORAL | 0 refills | Status: AC

## 2021-11-28 MED ORDER — ONDANSETRON 4 MG PO TBDP
4.0000 mg | ORAL_TABLET | Freq: Once | ORAL | Status: AC
  Administered 2021-11-28: 4 mg via ORAL
  Filled 2021-11-28: qty 1

## 2021-11-28 MED ORDER — CEPHALEXIN 250 MG PO CAPS: 500.0000 mg | ORAL_CAPSULE | Freq: Once | ORAL | Status: DC

## 2021-11-28 MED ORDER — ACETAMINOPHEN 325 MG PO TABS
650.0000 mg | ORAL_TABLET | Freq: Once | ORAL | Status: AC
  Administered 2021-11-28: 650 mg via ORAL
  Filled 2021-11-28: qty 2

## 2021-11-28 MED ORDER — ONDANSETRON HCL 4 MG PO TABS: 4.0000 mg | ORAL_TABLET | Freq: Four times a day (QID) | ORAL | 0 refills | Status: DC

## 2021-11-28 NOTE — ED Triage Notes (Signed)
Patient reports to the ER for fatigue, fevers, and headaches. Patient reports this started yesterday. Endorses nausea and emesis yesterday

## 2021-11-28 NOTE — Discharge Instructions (Signed)
Concern that you may have a kidney infection today however your symptoms may be related to a viral illness as well.  You are given a prescription for antibiotic that you can start taking tomorrow and use the nausea medicine as needed.  Take Tylenol and ibuprofen as needed for pain and body aches.  If you start having repetitive vomiting, the fever does not improve or the pain becomes severe return to the emergency room.

## 2021-11-28 NOTE — Progress Notes (Deleted)
Referring Provider: Dettinger, Elige Radon, MD Primary Care Physician:  Dettinger, Elige Radon, MD Primary GI Physician: Dr. Marletta Lor  No chief complaint on file.   HPI:   Alicia Gilbert is a 36 y.o. female  presenting today for follow-up of abdominal pain, GERD, and H. pylori.   She initially tested positive for H. pylori in February 2022 and was treated with metronidazole, clarithromycin, and PPI.  EGD completed 09/28/2020 revealing gastritis, biopsies positive for H. pylori.  She was treated with Pylera (only took PPI daily).  Again saw PCP in June 2022 with ongoing abdominal pain and intermittent nausea, and treated with extended course of Pylera (did not take PPI).  She was treated again with Pylera with high-dose PPI twice daily in October 2022.  Prior PPIs have included omeprazole, pantoprazole, and Nexium most recently.  Last seen in our office 07/19/2021.  She reported abdominal pain and nausea had decreased, but still with symptoms.  Reported intermittent epigastric/RUQ pain occurring daily, but not triggered by meals.  Nausea comes first, then abdominal pain.  Intermittent vomiting.  Reflux 2-3 times a week.  She is not taking pantoprazole x 4 days, but stated it did help with upper GI symptoms.  Denied NSAIDs.  Also noted on the day she had more abdominal pain, she had diarrhea the next day.  In general having diarrhea 4 to 5 days a week with 4-5 watery bowel movements daily along with nocturnal stools.  Reported mom with history of "chronic colitis", but unknown as UC or Crohn's.  Noted CT in February 2023 with mild wall thickening and mesenteric stranding involving distal and terminal ileum. On exam, she is most tender in the LLQ without rebound or guarding, also with mild TTP in LUQ, epigastric, and RUQ region which was more diffuse compared to pain noted at the time of CT in February. Planned to check labs, stool studies, H pylori breath test, and CT A/P.  Would need colonoscopy in  the near future pending results.  CBC, CMP, sed rate, CRP within normal limits.  C. difficile negative.  GI panel was not completed and patient reported resolution of diarrhea.  H. pylori breath test was positive.  Recommended referral to [facility] for biopsies with culture and sensitivity.  CT A/P with contrast revealed no acute process.  No bowel wall thickening.  Patient underwent EGD 11/10/2021 with [facility] revealing normal esophagus, mild gastritis biopsied, normal examined duodenum.  Today:   H pylori:  EGD Pathology:   GERD:  Upper abdominal pain/nausea/vomiting:  Diarrhea:  Abnormal CT small bowel   Past Medical History:  Diagnosis Date   H. pylori infection    S/p treatment with metronidazole, clarithromycin, and PPI February 2022, treated with Prevpac May 2022, treated with 21-day course of Pylera in June 2022 without PPI, treated with Pylera with high dose PPI BID October 2022.   Heart murmur    childhood   Hypertension    Kidney infection     Past Surgical History:  Procedure Laterality Date   APPENDECTOMY     BIOPSY  09/28/2020   Procedure: BIOPSY;  Surgeon: Lanelle Bal, DO;  Location: [facility];  Service: Endoscopy;;   ESOPHAGOGASTRODUODENOSCOPY (EGD) WITH PROPOFOL N/A 09/28/2020   Surgeon: Lanelle Bal, DO; Gastritis biopsied, otherwise normal exam.  Recommend PPI twice daily and avoiding NSAIDs.  Pathology revealed chronic active gastritis with H. pylori.    Current Outpatient Medications  Medication Sig Dispense Refill  folic acid (FOLVITE) 1 MG tablet Take 1 mg by mouth daily.     Multiple Vitamins-Minerals (MULTIVITAMIN WITH MINERALS) tablet Take 1 tablet by mouth daily. [facility]     pantoprazole (PROTONIX) 40 MG tablet Take 1 tablet (40 mg total) by mouth daily before breakfast. 90 tablet 1   No current facility-administered medications for this visit.    Allergies as of 11/29/2021 - Review Complete 11/03/2021  Allergen  Reaction Noted   Penicillins Hives and Rash 06/04/2013   Shellfish allergy Hives and Swelling 09/18/2020    Family History  Problem Relation Age of Onset   Heart disease Mother    Gastric cancer Mother    Breast cancer Mother    Colitis Mother    Heart disease Father    Colon cancer Neg Hx     Social History   Socioeconomic History   Marital status: Married    Spouse name: Not on file   Number of children: Not on file   Years of education: Not on file   Highest education level: Not on file  Occupational History   Not on file  Tobacco Use   Smoking status: Never   Smokeless tobacco: Never  Vaping Use   Vaping Use: Never used  Substance and Sexual Activity   Alcohol use: Never   Drug use: Never   Sexual activity: Yes    Birth control/protection: None, I.U.D.  Other Topics Concern   Not on file  Social History Narrative   ** Merged History Encounter **       Social Determinants of Health   Financial Resource Strain: Not on file  Food Insecurity: Not on file  Transportation Needs: Not on file  Physical Activity: Not on file  Stress: Not on file  Social Connections: Not on file    Review of Systems: Gen: Denies fever, chills, cold or flulike symptoms, presyncope, syncope. CV: Denies chest pain, palpitations. Resp: Denies dyspnea at rest, cough. GI: See HPI Heme:  See HPI  Physical Exam: There were no vitals taken for this visit. General:   Alert and oriented. No distress noted. Pleasant and cooperative.  Head:  Normocephalic and atraumatic. Eyes:  Conjuctiva clear without scleral icterus. Heart:  S1, S2 present without murmurs appreciated. Lungs:  Clear to auscultation bilaterally. No wheezes, rales, or rhonchi. No distress.  Abdomen:  +BS, soft, non-tender and non-distended. No rebound or guarding. No HSM or masses noted. Msk:  Symmetrical without gross deformities. Normal posture. Extremities:  Without edema. Neurologic:  Alert and  oriented x4 Psych:   Normal mood and affect.    Assessment:     Plan:  ***   Ermalinda Memos, PA-C [facility] 11/29/2021

## 2021-11-28 NOTE — ED Provider Notes (Signed)
MEDCENTER [facility] EMERGENCY DEPT Provider Note   CSN: [id number] Arrival date & time: 11/28/21  1826     History  Chief Complaint  Patient presents with   Headache   Fever   Fatigue    Alicia Gilbert is a 36 y.o. female.  Patient is a 36 year old female with a history of prior kidney infections, hypertension, status post appendectomy who is presenting today with 2-day history of fever, headache, general malaise, myalgias, lower abdominal pain and 1 episode of emesis.  She also has been having some right flank and RLQ pain when urinating but denies dysuria but having some frequency.  No cough or sore throat.  She denies any vaginal discharge and last menses was within 1 month.  She denies any shortness of breath.  No known sick contacts.    The history is provided by the patient. The history is limited by a language barrier. A language interpreter was used.  Headache Associated symptoms: fever   Fever Associated symptoms: headaches        Home Medications Prior to Admission medications   Medication Sig Start Date End Date Taking? Authorizing Provider  cephALEXin (KEFLEX) 500 MG capsule Take 1 capsule (500 mg total) by mouth 4 (four) times daily for 7 days. 11/28/21 12/05/21 Yes Braedan Meuth, Alphonzo Lemmings, MD  ondansetron (ZOFRAN) 4 MG tablet Take 1 tablet (4 mg total) by mouth every 6 (six) hours. 11/28/21  Yes Lowella Kindley, Alphonzo Lemmings, MD  folic acid (FOLVITE) 1 MG tablet Take 1 mg by mouth daily.    [provider]  Multiple Vitamins-Minerals (MULTIVITAMIN WITH MINERALS) tablet Take 1 tablet by mouth daily. Woman's    [provider]  pantoprazole (PROTONIX) 40 MG tablet Take 1 tablet (40 mg total) by mouth daily before breakfast. 08/12/21 08/12/22  Letta Median, PA-C      Allergies    Penicillins and Shellfish allergy    Review of Systems   Review of Systems  Constitutional:  Positive for fever.  Neurological:  Positive for headaches.    Physical  Exam Updated Vital Signs BP 123/83 (BP Location: Right Arm)   Pulse 86   Temp (!) 100.8 F (38.2 C)   Resp 18   Ht 5\' 4"  (1.626 m)   Wt 85 kg   LMP 10/29/2021 (Approximate)   SpO2 100%   Breastfeeding No   BMI 32.17 kg/m  Physical Exam Vitals and nursing note reviewed.  Constitutional:      General: She is not in acute distress.    Appearance: She is well-developed.  HENT:     Head: Normocephalic and atraumatic.     Right Ear: Tympanic membrane normal.     Left Ear: Tympanic membrane normal.     Nose: No congestion.     Mouth/Throat:     Mouth: Mucous membranes are moist.  Eyes:     Pupils: Pupils are equal, round, and reactive to light.  Cardiovascular:     Rate and Rhythm: Normal rate and regular rhythm.     Heart sounds: Normal heart sounds. No murmur heard.    No friction rub.  Pulmonary:     Effort: Pulmonary effort is normal.     Breath sounds: Normal breath sounds. No wheezing or rales.  Abdominal:     General: Bowel sounds are normal. There is no distension.     Palpations: Abdomen is soft.     Tenderness: There is abdominal tenderness. There is no guarding or rebound.  Comments: Mild lower abdominal pain without rebound or guarding  Musculoskeletal:        General: No tenderness. Normal range of motion.     Cervical back: Neck supple. No tenderness.     Right lower leg: No edema.     Left lower leg: No edema.     Comments: No edema  Skin:    General: Skin is warm and dry.     Findings: No rash.  Neurological:     Mental Status: She is alert and oriented to person, place, and time. Mental status is at baseline.     Cranial Nerves: No cranial nerve deficit.  Psychiatric:        Mood and Affect: Mood normal.        Behavior: Behavior normal.     ED Results / Procedures / Treatments   Labs (all labs ordered are listed, but only abnormal results are displayed) Labs Reviewed  URINALYSIS, ROUTINE W REFLEX MICROSCOPIC - Abnormal; Notable for the  following components:      Result Value   APPearance HAZY (*)    Hgb urine dipstick TRACE (*)    Leukocytes,Ua MODERATE (*)    Bacteria, UA RARE (*)    All other components within normal limits  COMPREHENSIVE METABOLIC PANEL - Abnormal; Notable for the following components:   Glucose, Bld 113 (*)    AST 12 (*)    All other components within normal limits  SARS CORONAVIRUS 2 BY RT PCR  PREGNANCY, URINE  CBC WITH DIFFERENTIAL/PLATELET    EKG None  Radiology No results found.  Procedures Procedures    Medications Ordered in ED Medications  cephALEXin (KEFLEX) capsule 500 mg (has no administration in time range)  acetaminophen (TYLENOL) tablet 650 mg (650 mg Oral Given 11/28/21 2019)  ondansetron (ZOFRAN-ODT) disintegrating tablet 4 mg (4 mg Oral Given 11/28/21 2019)    ED Course/ Medical Decision Making/ A&P                           Medical Decision Making Amount and/or Complexity of Data Reviewed Labs: ordered. Decision-making details documented in ED Course.  Risk OTC drugs. Prescription drug management.   Patient is a 36 year old female presenting with 2 days of fever, headache, lower abdominal discomfort and 1 episode of emesis.  Patient has no flank pain on exam or upper respiratory complaints.  She has no signs concerning for meningitis at this time.  She denies any vaginal bleeding or discharge.  Low suspicion at this time of kidney stone, diverticulitis, hepatitis.  Low suspicion for pneumonia.  Concern for possible COVID versus UTI versus other viral pathology.  No evidence of strep throat at this time.  Patient given Zofran and Tylenol.  I independently interpreted patient's labs today.  UA does show trace blood, moderate leukocytes and 11-20 white cells but rare bacteria in the sample does appear contaminated.  Urine pregnancy test is negative and CBC is within normal limits and CMP and covid are neg. given patient's complaint of pain in the flank and right side of  the abdomen with urinating and her prior history of pyelonephritis without URI type symptoms we will treat for possible early UTI.  Patient also was told that this could be viral in nature as well.  Will cover with Keflex.  She was given antiemetics.  She was given return precautions.        Final Clinical Impression(s) / ED Diagnoses Final diagnoses:  Pyelonephritis    Rx / DC Orders ED Discharge Orders          Ordered    cephALEXin (KEFLEX) 500 MG capsule  4 times daily        11/28/21 2115    ondansetron (ZOFRAN) 4 MG tablet  Every 6 hours        11/28/21 2115              Blanchie Dessert, MD 11/28/21 2115

## 2021-11-29 ENCOUNTER — Inpatient Hospital Stay: Payer: Medicaid Other | Admitting: Gastroenterology

## 2022-03-11 ENCOUNTER — Ambulatory Visit (INDEPENDENT_AMBULATORY_CARE_PROVIDER_SITE_OTHER): Payer: Self-pay | Admitting: Family Medicine

## 2022-03-11 ENCOUNTER — Encounter: Payer: Self-pay | Admitting: Family Medicine

## 2022-03-11 VITALS — BP 120/69 | HR 67 | Temp 98.0°F | Ht 64.0 in | Wt 192.0 lb

## 2022-03-11 DIAGNOSIS — L0291 Cutaneous abscess, unspecified: Secondary | ICD-10-CM

## 2022-03-11 DIAGNOSIS — F419 Anxiety disorder, unspecified: Secondary | ICD-10-CM

## 2022-03-11 DIAGNOSIS — L282 Other prurigo: Secondary | ICD-10-CM

## 2022-03-11 DIAGNOSIS — Z630 Problems in relationship with spouse or partner: Secondary | ICD-10-CM

## 2022-03-11 DIAGNOSIS — F339 Major depressive disorder, recurrent, unspecified: Secondary | ICD-10-CM

## 2022-03-11 MED ORDER — CETIRIZINE HCL 10 MG PO TABS: 10.0000 mg | ORAL_TABLET | Freq: Every day | ORAL | 11 refills | Status: AC

## 2022-03-11 MED ORDER — DOXYCYCLINE HYCLATE 100 MG PO TABS: 100.0000 mg | ORAL_TABLET | Freq: Two times a day (BID) | ORAL | 0 refills | Status: AC

## 2022-03-11 MED ORDER — PREDNISONE 20 MG PO TABS: ORAL_TABLET | ORAL | 0 refills | Status: DC

## 2022-03-11 MED ORDER — FAMOTIDINE 20 MG PO TABS: 20.0000 mg | ORAL_TABLET | Freq: Two times a day (BID) | ORAL | 0 refills | Status: DC

## 2022-03-11 NOTE — Progress Notes (Signed)
Subjective:  Patient ID: Alicia Gilbert, female    DOB: March 27, 1986, 36 y.o.   MRN: [id number]  Patient Care Team: Dettinger, Elige Radon, MD as PCP - General (Family Medicine) Dettinger, Elige Radon, MD (Family Medicine) Lanelle Bal, DO as Consulting Physician (Internal Medicine)   Chief Complaint:  Rash (All over body x 3 days. Patient states it started on her face and has spread. )   HPI: Alicia Gilbert is a 36 y.o. female presenting on 03/11/2022 for Rash (All over body x 3 days. Patient states it started on her face and has spread. ) Spanish interpreter used for entire visit: Vladimir Crofts [phone]  Alicia Gilbert presents today for evaluation of a pruritic rash to her face, neck, chest, and back. States this started 2-3 days ago and seems to be worsening. She denies any new products at home, new foods, new pets, or new medications. No reported angioedema or difficulty breathing. She also reports a red raised knot behind her left ear, states this has been present for over 1 week.  PHQ revealed indications of depression. When speaking to Alicia Gilbert about this, she states she has been having marital issues. Denies being abused physically. States her spouse always wants to have sex and she does not. States they argue about this often. States her sister has now moved in with her and she feels safer. She has been seen by [facility] in the past and would like to return. Denies SI or HI.   Rash This is a new problem. Episode onset: 3 days ago. The problem has been gradually worsening since onset. The rash is diffuse. The rash is characterized by itchiness and redness. She was exposed to nothing. Associated symptoms include fatigue. Pertinent negatives include no anorexia, congestion, cough, diarrhea, eye pain, facial edema, fever, joint pain, nail changes, rhinorrhea, shortness of breath, sore throat or vomiting. Past treatments include anti-itch cream. The treatment provided no relief.      11/03/2021    10:38 AM 12/16/2020   10:50 AM 12/16/2020   10:49 AM 11/13/2020    8:46 AM 10/22/2020    4:00 PM  Depression screen PHQ 2/9  Decreased Interest 0  2 0 0  Down, Depressed, Hopeless 2 0 0 0 0  PHQ - 2 Score 2 0 2 0 0  Altered sleeping 3 3     Tired, decreased energy 3 2     Change in appetite 3 1     Feeling bad or failure about yourself  2 0     Trouble concentrating 2 0     Moving slowly or fidgety/restless 1 1     Suicidal thoughts 1 1     PHQ-9 Score 17 8     Difficult doing work/chores Somewhat difficult          11/03/2021   10:39 AM 12/16/2020   10:50 AM  GAD 7 : Generalized Anxiety Score  Nervous, Anxious, on Edge 1 0  Control/stop worrying 1 0  Worry too much - different things 1 0  Trouble relaxing 2 0  Restless 3 0  Easily annoyed or irritable 3 0  Afraid - awful might happen 3 0  Total GAD 7 Score 14 0  Anxiety Difficulty Somewhat difficult        Relevant past medical, surgical, family, and social history reviewed and updated as indicated.  Allergies and medications reviewed and updated. Data reviewed: Chart in Epic.   Past Medical History:  Diagnosis Date  H. pylori infection    S/p treatment with metronidazole, clarithromycin, and PPI February 2022, treated with Prevpac May 2022, treated with 21-day course of Pylera in June 2022 without PPI, treated with Pylera with high dose PPI BID October 2022.   Heart murmur    childhood   Hypertension    Kidney infection     Past Surgical History:  Procedure Laterality Date   APPENDECTOMY     BIOPSY  09/28/2020   Procedure: BIOPSY;  Surgeon: Lanelle Bal, DO;  Location: [facility];  Service: Endoscopy;;   ESOPHAGOGASTRODUODENOSCOPY (EGD) WITH PROPOFOL N/A 09/28/2020   Surgeon: Lanelle Bal, DO; Gastritis biopsied, otherwise normal exam.  Recommend PPI twice daily and avoiding NSAIDs.  Pathology revealed chronic active gastritis with H. pylori.    Social History   Socioeconomic History   Marital  status: Married    Spouse name: Not on file   Number of children: Not on file   Years of education: Not on file   Highest education level: Not on file  Occupational History   Not on file  Tobacco Use   Smoking status: Never    Passive exposure: Never   Smokeless tobacco: Never  Vaping Use   Vaping Use: Never used  Substance and Sexual Activity   Alcohol use: Never   Drug use: Never   Sexual activity: Yes    Birth control/protection: None, I.U.D.  Other Topics Concern   Not on file  Social History Narrative   ** Merged History Encounter **       Social Determinants of Health   Financial Resource Strain: Not on file  Food Insecurity: Not on file  Transportation Needs: Not on file  Physical Activity: Not on file  Stress: Not on file  Social Connections: Not on file  Intimate Partner Violence: Not on file    Outpatient Encounter Medications as of 03/11/2022  Medication Sig   cetirizine (ZYRTEC) 10 MG tablet Take 1 tablet (10 mg total) by mouth daily.   doxycycline (VIBRA-TABS) 100 MG tablet Take 1 tablet (100 mg total) by mouth 2 (two) times daily for 10 days. 1 po bid   famotidine (PEPCID) 20 MG tablet Take 1 tablet (20 mg total) by mouth 2 (two) times daily for 14 days.   folic acid (FOLVITE) 1 MG tablet Take 1 mg by mouth daily.   Multiple Vitamins-Minerals (MULTIVITAMIN WITH MINERALS) tablet Take 1 tablet by mouth daily. Woman's   ondansetron (ZOFRAN) 4 MG tablet Take 1 tablet (4 mg total) by mouth every 6 (six) hours.   predniSONE (DELTASONE) 20 MG tablet 2 po at sametime daily for 5 days- start tomorrow   pantoprazole (PROTONIX) 40 MG tablet Take 1 tablet (40 mg total) by mouth daily before breakfast. (Patient not taking: Reported on 03/11/2022)   No facility-administered encounter medications on file as of 03/11/2022.    Allergies  Allergen Reactions   Penicillins Hives and Rash    Reaction: Childhood   Shellfish Allergy Hives and Swelling    Review of Systems   Constitutional:  Positive for activity change, appetite change and fatigue. Negative for chills, diaphoresis, fever and unexpected weight change.  HENT:  Negative for congestion, rhinorrhea and sore throat.   Eyes:  Negative for photophobia, pain and visual disturbance.  Respiratory:  Negative for apnea, cough, choking, chest tightness, shortness of breath, wheezing and stridor.   Cardiovascular:  Negative for chest pain, palpitations and leg swelling.  Gastrointestinal:  Negative for abdominal pain,  anorexia, diarrhea, nausea and vomiting.  Genitourinary:  Negative for decreased urine volume and difficulty urinating.  Musculoskeletal:  Negative for joint pain.  Skin:  Positive for color change and rash. Negative for nail changes, pallor and wound.  Neurological:  Negative for dizziness and weakness.  Psychiatric/Behavioral:  Positive for agitation, decreased concentration, dysphoric mood and sleep disturbance. Negative for behavioral problems, confusion, hallucinations, self-injury and suicidal ideas. The patient is nervous/anxious. The patient is not hyperactive.   All other systems reviewed and are negative.       Objective:  BP 120/69   Pulse 67   Temp 98 F (36.7 C) (Temporal)   Ht 5\' 4"  (1.626 m)   Wt 192 lb (87.1 kg)   SpO2 97%   BMI 32.96 kg/m    Wt Readings from Last 3 Encounters:  03/11/22 192 lb (87.1 kg)  11/28/21 187 lb 6.3 oz (85 kg)  11/03/21 186 lb (84.4 kg)    Physical Exam Vitals and nursing note reviewed.  Constitutional:      General: She is not in acute distress.    Appearance: Normal appearance. She is obese. She is not ill-appearing, toxic-appearing or diaphoretic.  HENT:     Head: Normocephalic and atraumatic.     Nose: Nose normal.     Mouth/Throat:     Mouth: Mucous membranes are moist.     Pharynx: Oropharynx is clear. No oropharyngeal exudate or posterior oropharyngeal erythema.     Tonsils: No tonsillar exudate or tonsillar abscesses.  Eyes:      Extraocular Movements: Extraocular movements intact.     Conjunctiva/sclera: Conjunctivae normal.     Pupils: Pupils are equal, round, and reactive to light.  Neck:     Trachea: Trachea and phonation normal.  Cardiovascular:     Rate and Rhythm: Normal rate and regular rhythm.     Pulses: Normal pulses.     Heart sounds: Normal heart sounds.  Pulmonary:     Effort: Pulmonary effort is normal.     Breath sounds: Normal breath sounds. No stridor. No wheezing or rales.  Abdominal:     General: Bowel sounds are normal.     Palpations: Abdomen is soft.  Musculoskeletal:     Cervical back: Neck supple.     Right lower leg: No edema.     Left lower leg: No edema.  Skin:    General: Skin is warm and dry.     Capillary Refill: Capillary refill takes less than 2 seconds.     Findings: Abscess (left [place]) and rash present. Rash is urticarial.  Neurological:     General: No focal deficit present.     Mental Status: She is alert and oriented to person, place, and time.     Results for orders placed or performed during the hospital encounter of 11/28/21  SARS Coronavirus 2 by RT PCR (hospital order, performed in [facility] hospital lab) *cepheid single result test* Anterior Nasal Swab   Specimen: Anterior Nasal Swab  Result Value Ref Range   SARS Coronavirus 2 by RT PCR NEGATIVE NEGATIVE  Urinalysis, Routine w reflex microscopic Anterior Nasal Swab  Result Value Ref Range   Color, Urine YELLOW YELLOW   APPearance HAZY (A) CLEAR   Specific Gravity, Urine 1.016 1.005 - 1.030   pH 7.0 5.0 - 8.0   Glucose, UA NEGATIVE NEGATIVE mg/dL   Hgb urine dipstick TRACE (A) NEGATIVE   Bilirubin Urine NEGATIVE NEGATIVE   Ketones, ur NEGATIVE NEGATIVE mg/dL  Protein, ur NEGATIVE NEGATIVE mg/dL   Nitrite NEGATIVE NEGATIVE   Leukocytes,Ua MODERATE (A) NEGATIVE   RBC / HPF 0-5 0 - 5 RBC/hpf   WBC, UA 11-20 0 - 5 WBC/hpf   Bacteria, UA RARE (A) NONE SEEN   Squamous Epithelial / LPF  6-10 0 - 5   Mucus PRESENT   Pregnancy, urine  Result Value Ref Range   Preg Test, Ur NEGATIVE NEGATIVE  CBC with Differential/Platelet  Result Value Ref Range   WBC 7.4 4.0 - 10.5 K/uL   RBC 3.98 3.87 - 5.11 MIL/uL   Hemoglobin 12.3 12.0 - 15.0 g/dL   HCT 61.6 07.3 - 71.0 %   MCV 91.7 80.0 - 100.0 fL   MCH 30.9 26.0 - 34.0 pg   MCHC 33.7 30.0 - 36.0 g/dL   RDW 62.6 94.8 - 54.6 %   Platelets 219 150 - 400 K/uL   nRBC 0.0 0.0 - 0.2 %   Neutrophils Relative % 69 %   Neutro Abs 5.1 1.7 - 7.7 K/uL   Lymphocytes Relative 21 %   Lymphs Abs 1.5 0.7 - 4.0 K/uL   Monocytes Relative 9 %   Monocytes Absolute 0.7 0.1 - 1.0 K/uL   Eosinophils Relative 0 %   Eosinophils Absolute 0.0 0.0 - 0.5 K/uL   Basophils Relative 1 %   Basophils Absolute 0.0 0.0 - 0.1 K/uL   Immature Granulocytes 0 %   Abs Immature Granulocytes 0.02 0.00 - 0.07 K/uL  Comprehensive metabolic panel  Result Value Ref Range   Sodium 138 135 - 145 mmol/L   Potassium 3.8 3.5 - 5.1 mmol/L   Chloride 102 98 - 111 mmol/L   CO2 25 22 - 32 mmol/L   Glucose, Bld 113 (H) 70 - 99 mg/dL   BUN 11 6 - 20 mg/dL   Creatinine, Ser 2.70 0.44 - 1.00 mg/dL   Calcium 9.4 8.9 - 35.0 mg/dL   Total Protein 7.7 6.5 - 8.1 g/dL   Albumin 4.5 3.5 - 5.0 g/dL   AST 12 (L) 15 - 41 U/L   ALT 8 0 - 44 U/L   Alkaline Phosphatase 46 38 - 126 U/L   Total Bilirubin 0.4 0.3 - 1.2 mg/dL   GFR, Estimated >09 >38 mL/min   Anion gap 11 5 - 15       Pertinent labs & imaging results that were available during my care of the patient were reviewed by me and considered in my medical decision making.  Assessment & Plan:  Alicia Gilbert was seen today for rash.  Diagnoses and all orders for this visit:  Pruritic rash Unknown trigger. No indications of anaphylaxis. Medications as prescribed. Report new worsening, or persistent symptoms. Aware when to see emergent treatment.  -     famotidine (PEPCID) 20 MG tablet; Take 1 tablet (20 mg total) by mouth 2 (two)  times daily for 14 days. -     predniSONE (DELTASONE) 20 MG tablet; 2 po at sametime daily for 5 days- start tomorrow -     cetirizine (ZYRTEC) 10 MG tablet; Take 1 tablet (10 mg total) by mouth daily.  Depression, recurrent (HCC) Anxiety Marital problems Denies SI or HI when asked. States she feels ok now that her sister is living with her. Aware of [facility] urgent care. Referred back to [facility]. Alicia Gilbert aware to report new, worsening, or persistent symptoms.  -     Ambulatory referral to Psychiatry  Abscess Symptomatic care discussed in detail. Medications as  prescribed. Report new, worsening, or persistent symptoms.  -     doxycycline (VIBRA-TABS) 100 MG tablet; Take 1 tablet (100 mg total) by mouth 2 (two) times daily for 10 days. 1 po bid     Continue all other maintenance medications.  Follow up plan: Return in about 4 weeks (around 04/08/2022), or if symptoms worsen or fail to improve, for PCP.   Continue healthy lifestyle choices, including diet (rich in fruits, vegetables, and lean proteins, and low in salt and simple carbohydrates) and exercise (at least 30 minutes of moderate physical activity daily).  Educational handout given for abscess  The above assessment and management plan was discussed with the patient. The patient verbalized understanding of and has agreed to the management plan. Patient is aware to call the clinic if they develop any new symptoms or if symptoms persist or worsen. Patient is aware when to return to the clinic for a follow-up visit. Patient educated on when it is appropriate to go to the emergency department.   Kari BaarsMichelle Silvia Hightower, FNP-C [facility] [phone]

## 2022-04-10 ENCOUNTER — Encounter (HOSPITAL_BASED_OUTPATIENT_CLINIC_OR_DEPARTMENT_OTHER): Payer: Self-pay | Admitting: *Deleted

## 2022-04-10 ENCOUNTER — Other Ambulatory Visit: Payer: Self-pay

## 2022-04-10 ENCOUNTER — Emergency Department (HOSPITAL_BASED_OUTPATIENT_CLINIC_OR_DEPARTMENT_OTHER)
Admission: EM | Admit: 2022-04-10 | Discharge: 2022-04-10 | Disposition: A | Discharge status: Home / Self Care | Payer: Medicaid Other | Attending: Emergency Medicine | Admitting: Emergency Medicine

## 2022-04-10 DIAGNOSIS — K0889 Other specified disorders of teeth and supporting structures: Secondary | ICD-10-CM | POA: Insufficient documentation

## 2022-04-10 DIAGNOSIS — I10 Essential (primary) hypertension: Secondary | ICD-10-CM | POA: Insufficient documentation

## 2022-04-10 MED ORDER — HYDROCODONE-ACETAMINOPHEN 5-325 MG PO TABS: 1.0000 | ORAL_TABLET | ORAL | 0 refills | Status: DC | PRN

## 2022-04-10 MED ORDER — DOXYCYCLINE HYCLATE 100 MG PO TABS
100.0000 mg | ORAL_TABLET | Freq: Once | ORAL | Status: AC
  Administered 2022-04-10: 100 mg via ORAL
  Filled 2022-04-10: qty 1

## 2022-04-10 MED ORDER — HYDROCODONE-ACETAMINOPHEN 5-325 MG PO TABS
1.0000 | ORAL_TABLET | Freq: Once | ORAL | Status: AC
  Administered 2022-04-10: 1 via ORAL
  Filled 2022-04-10: qty 1

## 2022-04-10 MED ORDER — DOXYCYCLINE HYCLATE 100 MG PO CAPS: 100.0000 mg | ORAL_CAPSULE | Freq: Two times a day (BID) | ORAL | 0 refills | Status: DC

## 2022-04-10 NOTE — Discharge Instructions (Signed)
Necesitas ver a un dentista para cuidar tu diente.

## 2022-04-10 NOTE — ED Triage Notes (Signed)
Pt c/o of dental pain times 2 days. Right lower tooth pain. Last seen a dentist 6 months ago. Has taken motrin and tylenol for pain. Both taken at 2300 last night. Denies fevers. Pt drove herself here. Speaks little english. Information by translator.

## 2022-04-10 NOTE — ED Provider Notes (Signed)
MEDCENTER [facility] EMERGENCY DEPT Provider Note   CSN: [id number] Arrival date & time: 04/10/22  3299     History  Chief Complaint  Patient presents with   Dental Pain    Alicia Gilbert is a 36 y.o. female.  The history is provided by the patient.  Dental Pain She has history of hypertension and comes in complaining of pain in her right lower molar for the last 6 months.  She states that she thinks it is a wisdom tooth that is causing her problem.  She has taken ibuprofen and acetaminophen without relief.  She has not seen a dentist.   Home Medications Prior to Admission medications   Medication Sig Start Date End Date Taking? Authorizing Provider  cetirizine (ZYRTEC) 10 MG tablet Take 1 tablet (10 mg total) by mouth daily. 03/11/22   Sonny Masters, FNP  famotidine (PEPCID) 20 MG tablet Take 1 tablet (20 mg total) by mouth 2 (two) times daily for 14 days. 03/11/22 03/25/22  Sonny Masters, FNP  folic acid (FOLVITE) 1 MG tablet Take 1 mg by mouth daily.    [provider]  Multiple Vitamins-Minerals (MULTIVITAMIN WITH MINERALS) tablet Take 1 tablet by mouth daily. Woman's    [provider]  ondansetron (ZOFRAN) 4 MG tablet Take 1 tablet (4 mg total) by mouth every 6 (six) hours. 11/28/21   Gwyneth Sprout, MD  pantoprazole (PROTONIX) 40 MG tablet Take 1 tablet (40 mg total) by mouth daily before breakfast. Patient not taking: Reported on 03/11/2022 08/12/21 08/12/22  Letta Median, PA-C  predniSONE (DELTASONE) 20 MG tablet 2 po at sametime daily for 5 days- start tomorrow 03/11/22   Sonny Masters, FNP      Allergies    Penicillins and Shellfish allergy    Review of Systems   Review of Systems  All other systems reviewed and are negative.   Physical Exam Updated Vital Signs Ht 5\' 4"  (1.626 m)   Wt 82.6 kg   BMI 31.24 kg/m  Physical Exam Vitals and nursing note reviewed.   36 year old female, resting comfortably and in no acute  distress. Vital signs are normal. Oxygen saturation is 100%, which is normal. Head is normocephalic and atraumatic. PERRLA, EOMI. Dentition is in generally good shape with no obvious caries seen.  However, tooth #30 has tenderness to percussion.  It appears that tooth #32 has been previously extracted.  There is no gingival swelling, erythema, pallor. Neck is nontender and supple without adenopathy. Lungs are clear without rales, wheezes, or rhonchi. Chest is nontender. Heart has regular rate and rhythm without murmur. Abdomen is soft, flat, nontender. Neurologic: Mental status is normal, cranial nerves are intact, moves all extremities equally.  ED Results / Procedures / Treatments    Procedures Procedures    Medications Ordered in ED Medications  doxycycline (VIBRA-TABS) tablet 100 mg (has no administration in time range)  HYDROcodone-acetaminophen (NORCO/VICODIN) 5-325 MG per tablet 1 tablet (has no administration in time range)    ED Course/ Medical Decision Making/ A&P                           Medical Decision Making  Dental pain, possible dental abscess.  I have ordered a dose of doxycycline and hydrocodone-acetaminophen and I am discharging patient with prescription for doxycycline and a small number of hydrocodone-acetaminophen tablets.  I have referred her to the on-call dentist.  Final Clinical Impression(s) / ED  Diagnoses Final diagnoses:  Pain, dental    Rx / DC Orders ED Discharge Orders          Ordered    HYDROcodone-acetaminophen (NORCO) 5-325 MG tablet  Every 4 hours PRN        04/10/22 0402    doxycycline (VIBRAMYCIN) 100 MG capsule  2 times daily        04/10/22 0402              Dione Booze, MD 04/10/22 [id number]

## 2022-04-13 ENCOUNTER — Encounter: Payer: Self-pay | Admitting: Family Medicine

## 2022-04-13 ENCOUNTER — Ambulatory Visit: Payer: Self-pay | Admitting: Family Medicine

## 2022-09-09 ENCOUNTER — Encounter: Payer: Self-pay | Admitting: Family Medicine

## 2022-09-09 ENCOUNTER — Ambulatory Visit (INDEPENDENT_AMBULATORY_CARE_PROVIDER_SITE_OTHER): Payer: Medicaid Other | Admitting: Family Medicine

## 2022-09-09 VITALS — BP 119/78 | HR 58 | Temp 98.2°F | Wt 188.0 lb

## 2022-09-09 DIAGNOSIS — R42 Dizziness and giddiness: Secondary | ICD-10-CM | POA: Diagnosis not present

## 2022-09-09 DIAGNOSIS — R5383 Other fatigue: Secondary | ICD-10-CM

## 2022-09-09 DIAGNOSIS — E669 Obesity, unspecified: Secondary | ICD-10-CM | POA: Diagnosis not present

## 2022-09-09 LAB — MICROSCOPIC EXAMINATION
RBC, Urine: NONE SEEN /hpf (ref 0–2)
Renal Epithel, UA: NONE SEEN /hpf

## 2022-09-09 LAB — URINALYSIS, COMPLETE
Bilirubin, UA: NEGATIVE
Glucose, UA: NEGATIVE
Leukocytes,UA: NEGATIVE
Nitrite, UA: NEGATIVE
Protein,UA: NEGATIVE
Specific Gravity, UA: 1.025 (ref 1.005–1.030)
Urobilinogen, Ur: 0.2 mg/dL (ref 0.2–1.0)
pH, UA: 6.5 (ref 5.0–7.5)

## 2022-09-09 NOTE — Progress Notes (Signed)
BP 119/78   Pulse (!) 58   Temp 98.2 F (36.8 C)   Wt 188 lb (85.3 kg)   LMP 09/02/2022 (Approximate)   SpO2 98%   BMI 32.27 kg/m    Subjective:   Patient ID: Alicia Gilbert, female    DOB: March 08, 1986, 37 y.o.   MRN: [id number]  HPI: Alicia Gilbert is a 37 y.o. female presenting on 09/09/2022 for Fatigue and Dizziness   HPI Fatigue and dizziness Patient comes in complaining of fatigue and dizziness and feeling more tired that been going on for about 3 to 4 days.  She says she is not having any sinus pressure or congestion.  She does have a little bit of ringing in her ears but that is coming gone.  She still does have indigestion that she fights off-and-on but it is actually been better than it was before so she does not think that that.  She does work in [employer] and works outside sometimes but she says she is stays hydrated and does not think she is dehydrated.  She did have a menstrual cycle last week but says it was not heavier than she normally is.  Relevant past medical, surgical, family and social history reviewed and updated as indicated. Interim medical history since our last visit reviewed. Allergies and medications reviewed and updated.  Review of Systems  Constitutional:  Positive for fatigue. Negative for chills and fever.  HENT:  Negative for congestion, ear discharge and ear pain.   Eyes:  Negative for redness and visual disturbance.  Respiratory:  Negative for chest tightness and shortness of breath.   Cardiovascular:  Negative for chest pain and leg swelling.  Gastrointestinal:  Positive for abdominal pain. Negative for constipation, diarrhea, nausea and vomiting.  Genitourinary:  Negative for difficulty urinating, dysuria, frequency, hematuria and urgency.  Musculoskeletal:  Negative for back pain and gait problem.  Skin:  Negative for rash.  Neurological:  Positive for dizziness. Negative for weakness and headaches.  Psychiatric/Behavioral:   Negative for agitation and behavioral problems.   All other systems reviewed and are negative.   Per HPI unless specifically indicated above   Allergies as of 09/09/2022       Reactions   Penicillins Hives, Rash   Reaction: Childhood   Shellfish Allergy Hives, Swelling        Medication List        Accurate as of Sep 09, 2022  2:54 PM. If you have any questions, ask your nurse or doctor.          STOP taking these medications    doxycycline 100 MG capsule Commonly known as: VIBRAMYCIN Stopped by: Elige Radon Hollis Tuller, MD       TAKE these medications    cetirizine 10 MG tablet Commonly known as: ZYRTEC Take 1 tablet (10 mg total) by mouth daily.   folic acid 1 MG tablet Commonly known as: FOLVITE Take 1 mg by mouth daily.   HYDROcodone-acetaminophen 5-325 MG tablet Commonly known as: Norco Take 1 tablet by mouth every 4 (four) hours as needed for moderate pain.   multivitamin with minerals tablet Take 1 tablet by mouth daily. Woman's         Objective:   BP 119/78   Pulse (!) 58   Temp 98.2 F (36.8 C)   Wt 188 lb (85.3 kg)   LMP 09/02/2022 (Approximate)   SpO2 98%   BMI 32.27 kg/m   Wt Readings from Last 3 Encounters:  09/09/22 188 lb (85.3 kg)  04/10/22 182 lb (82.6 kg)  03/11/22 192 lb (87.1 kg)    Physical Exam Vitals and nursing note reviewed.  Constitutional:      General: She is not in acute distress.    Appearance: She is well-developed. She is not diaphoretic.  HENT:     Right Ear: Tympanic membrane and ear canal normal.     Left Ear: Tympanic membrane and ear canal normal.     Mouth/Throat:     Mouth: Mucous membranes are moist.     Pharynx: Oropharynx is clear. No oropharyngeal exudate or posterior oropharyngeal erythema.  Eyes:     Conjunctiva/sclera: Conjunctivae normal.  Cardiovascular:     Rate and Rhythm: Normal rate and regular rhythm.     Heart sounds: Normal heart sounds. No murmur heard. Pulmonary:     Effort:  Pulmonary effort is normal. No respiratory distress.     Breath sounds: Normal breath sounds. No wheezing or rhonchi.  Abdominal:     General: Abdomen is flat. Bowel sounds are normal. There is no distension.     Palpations: Abdomen is soft.     Tenderness: There is abdominal tenderness in the epigastric area and suprapubic area. There is no right CVA tenderness, guarding or rebound.  Musculoskeletal:        General: No tenderness. Normal range of motion.  Skin:    General: Skin is warm and dry.     Findings: No rash.  Neurological:     Mental Status: She is alert and oriented to person, place, and time.     Coordination: Coordination normal.  Psychiatric:        Behavior: Behavior normal.       Assessment & Plan:   Problem List Items Addressed This Visit       Other   Obesity (BMI 30-39.9)   Relevant Orders   Lipid panel   Other Visit Diagnoses     Other fatigue    -  Primary   Relevant Orders   CBC with Differential/Platelet   CMP14+EGFR   Urine Culture   Urinalysis, Complete   Dizziness       Relevant Orders   CBC with Differential/Platelet   CMP14+EGFR   Urine Culture   Urinalysis, Complete     Will check a urine and blood counts to look for any signs of infection, will also check her electrolytes to make sure she is not dehydrated.  Abdominal pain that she does have seems to be the one that she normally has and is no different.  Follow up plan: Return if symptoms worsen or fail to improve.  Counseling provided for all of the vaccine components Orders Placed This Encounter  Procedures   Urine Culture   CBC with Differential/Platelet   CMP14+EGFR   Urinalysis, Complete   Lipid panel    Arville Care, MD [facility] 09/09/2022, 2:54 PM

## 2022-09-10 LAB — CBC WITH DIFFERENTIAL/PLATELET
Basophils Absolute: 0 10*3/uL (ref 0.0–0.2)
Basos: 1 %
EOS (ABSOLUTE): 0.1 10*3/uL (ref 0.0–0.4)
Eos: 1 %
Hematocrit: 36.6 % (ref 34.0–46.6)
Hemoglobin: 12.3 g/dL (ref 11.1–15.9)
Immature Grans (Abs): 0 10*3/uL (ref 0.0–0.1)
Immature Granulocytes: 0 %
Lymphocytes Absolute: 1.9 10*3/uL (ref 0.7–3.1)
Lymphs: 29 %
MCH: 31.6 pg (ref 26.6–33.0)
MCHC: 33.6 g/dL (ref 31.5–35.7)
MCV: 94 fL (ref 79–97)
Monocytes Absolute: 0.5 10*3/uL (ref 0.1–0.9)
Monocytes: 8 %
Neutrophils Absolute: 3.9 10*3/uL (ref 1.4–7.0)
Neutrophils: 61 %
Platelets: 245 10*3/uL (ref 150–450)
RBC: 3.89 x10E6/uL (ref 3.77–5.28)
RDW: 11.9 % (ref 11.7–15.4)
WBC: 6.4 10*3/uL (ref 3.4–10.8)

## 2022-09-10 LAB — CMP14+EGFR
ALT: 16 IU/L (ref 0–32)
AST: 17 IU/L (ref 0–40)
Albumin/Globulin Ratio: 1.7 (ref 1.2–2.2)
Albumin: 4.2 g/dL (ref 3.9–4.9)
Alkaline Phosphatase: 58 IU/L (ref 44–121)
BUN/Creatinine Ratio: 24 — ABNORMAL HIGH (ref 9–23)
BUN: 14 mg/dL (ref 6–20)
Bilirubin Total: 0.3 mg/dL (ref 0.0–1.2)
CO2: 22 mmol/L (ref 20–29)
Calcium: 9.1 mg/dL (ref 8.7–10.2)
Chloride: 105 mmol/L (ref 96–106)
Creatinine, Ser: 0.59 mg/dL (ref 0.57–1.00)
Globulin, Total: 2.5 g/dL (ref 1.5–4.5)
Glucose: 99 mg/dL (ref 70–99)
Potassium: 3.7 mmol/L (ref 3.5–5.2)
Sodium: 141 mmol/L (ref 134–144)
Total Protein: 6.7 g/dL (ref 6.0–8.5)
eGFR: 120 mL/min/{1.73_m2} (ref >59)

## 2022-09-10 LAB — LIPID PANEL
Chol/HDL Ratio: 2.8 ratio (ref 0.0–4.4)
Cholesterol, Total: 169 mg/dL (ref 100–199)
HDL: 60 mg/dL (ref >39)
LDL Chol Calc (NIH): 86 mg/dL (ref 0–99)
Triglycerides: 133 mg/dL (ref 0–149)
VLDL Cholesterol Cal: 23 mg/dL (ref 5–40)

## 2022-09-14 LAB — URINE CULTURE

## 2023-02-28 IMAGING — US US ABDOMEN LIMITED RUQ/ASCITES
1 series · 14 of 25 positions shown · non-contrast
Comparison: Ultrasound abdomen limited 01/13/2020

CLINICAL DATA: Colicky right upper quadrant abdominal pain

EXAM:
ULTRASOUND ABDOMEN LIMITED RIGHT UPPER QUADRANT

[Series 1: us abdomen limited ruq (liver/gb) · 14 of 75 slices shown]
[im 1/75]
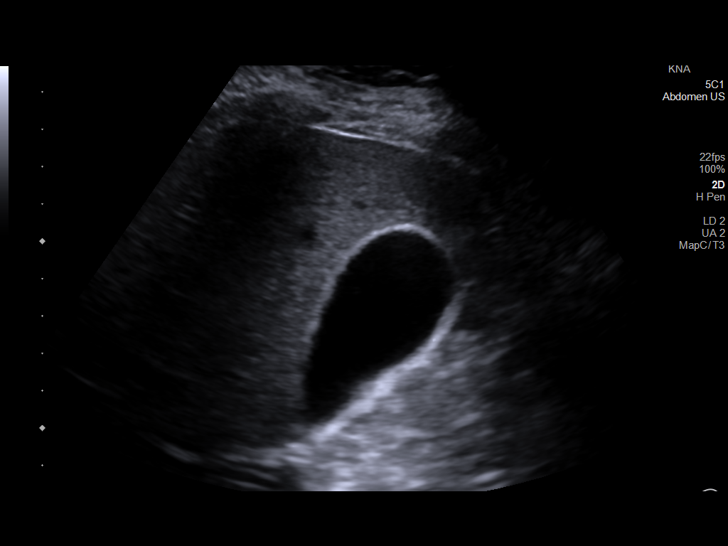
[im 7/75]
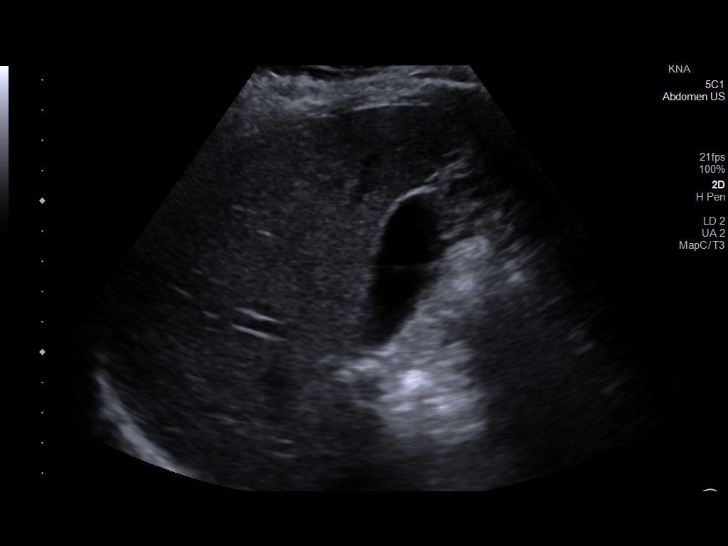
[im 13/75]
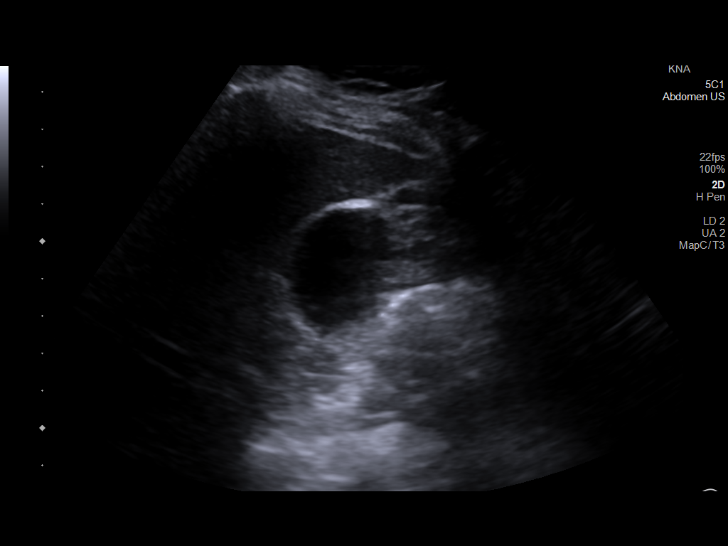
[im 19/75]
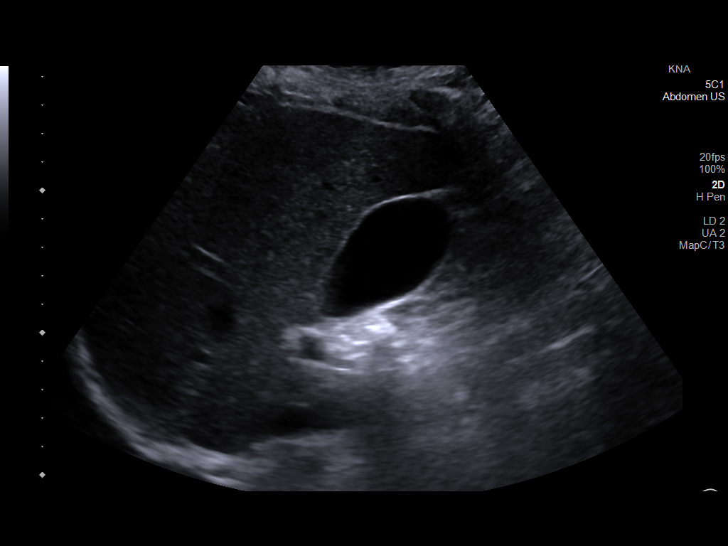
[im 25/75]
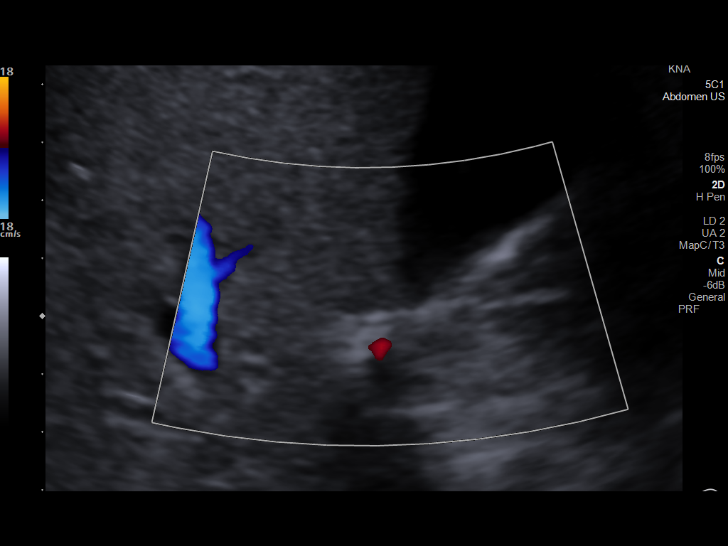
[im 28/75]
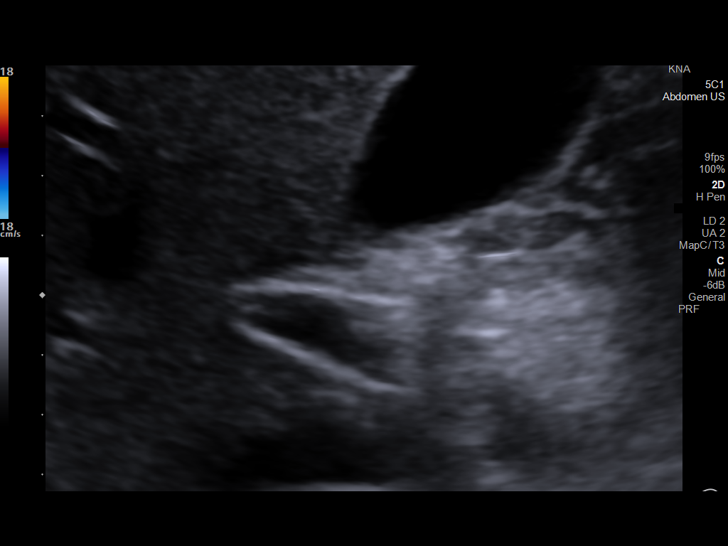
[im 34/75]
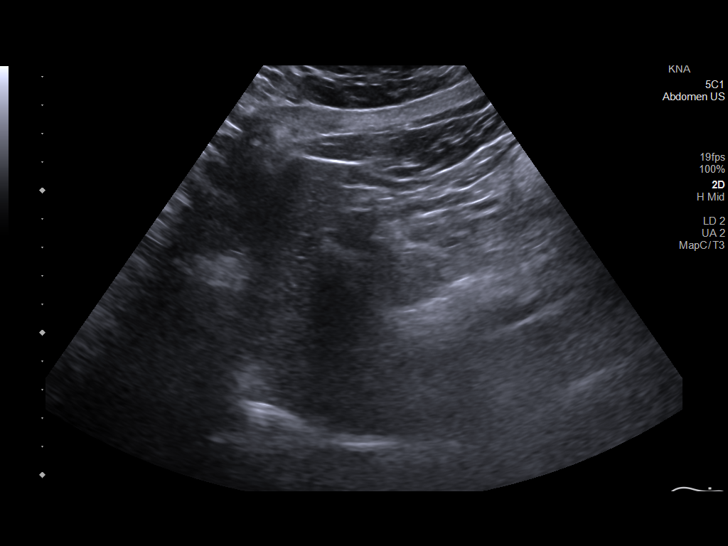
[im 41/75]
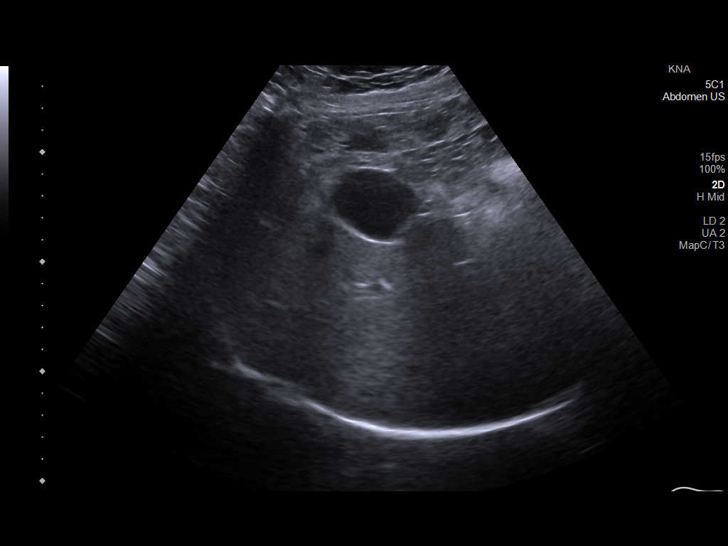
[im 47/75]
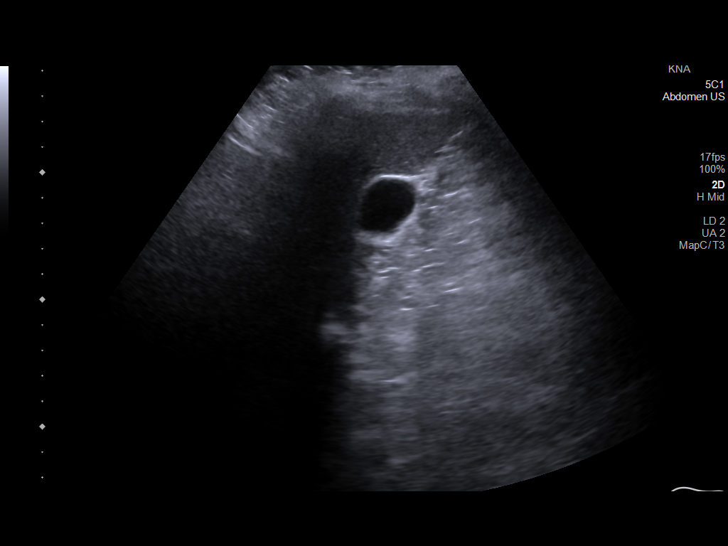
[im 50/75]
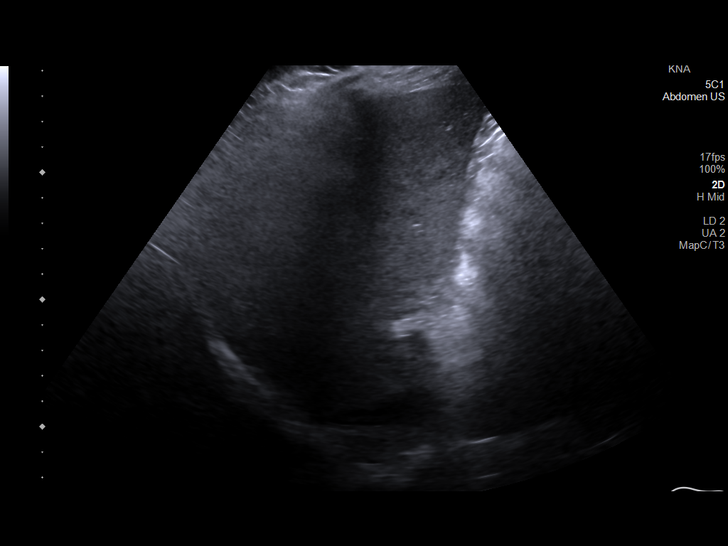
[im 56/75]
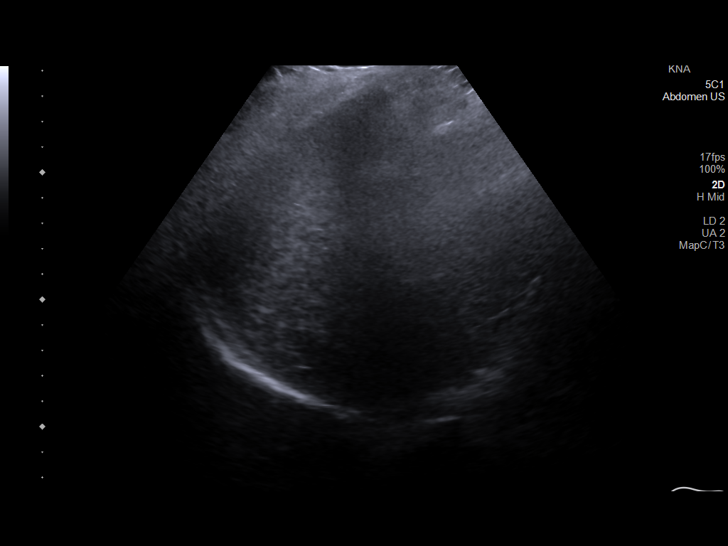
[im 62/75]
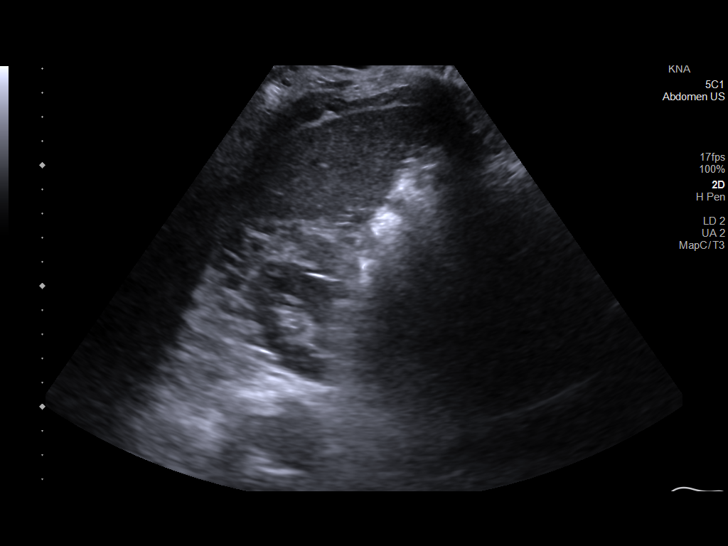
[im 68/75]
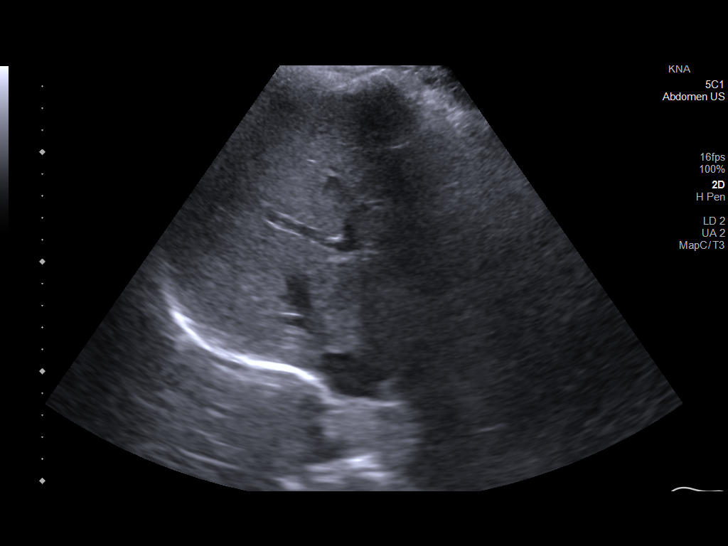
[im 75/75]
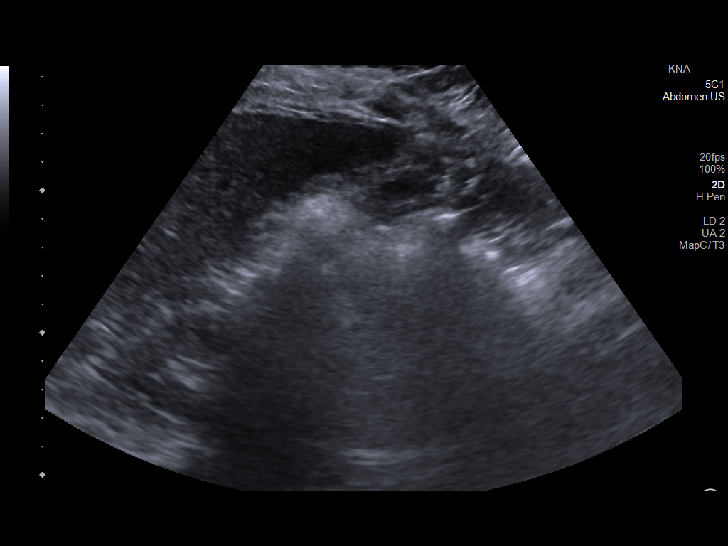

[14 of 25 positions shown; findings below may reference images not displayed]

FINDINGS: Gallbladder:

No gallstones or wall thickening visualized. No sonographic Murphy
sign noted by sonographer.

Common bile duct:

Diameter: 2 mm

Liver:

No focal lesion identified. Within normal limits in parenchymal
echogenicity. Portal vein is patent on color Doppler imaging with
normal direction of blood flow towards the liver.

Other: None.
IMPRESSION: Normal right upper quadrant ultrasound.

## 2023-05-18 IMAGING — CT CT ABD-PELV W/ CM
2 of 4 series · 16 of 46 positions shown, 18 images · IV contrast (omnipaque)
Comparison: Ultrasound 08/19/2020

CLINICAL DATA: Right lower back and abdominal pain

EXAM:
CT ABDOMEN AND PELVIS WITH CONTRAST
TECHNIQUE: Multidetector CT imaging of the abdomen and pelvis was performed
using the standard protocol following bolus administration of
intravenous contrast.
CONTRAST:  100mL OMNIPAQUE IOHEXOL 300 MG/ML  SOLN

[Series 3: abdomen 5.0 · axial · 0.98mm/px · z∈[-344,+86]mm · 13 of 98 slices shown, 15 images]
[im 6/98  soft-tissue]
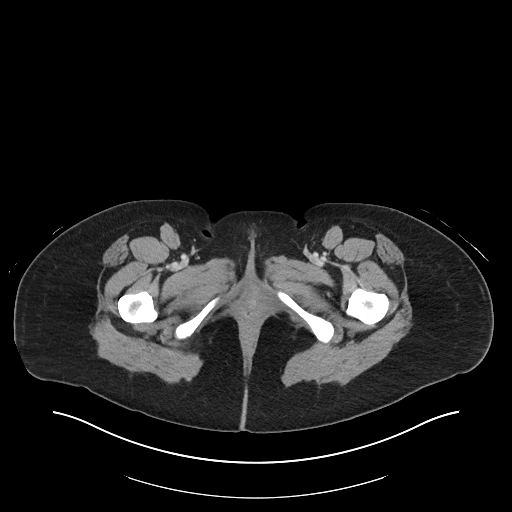
[im 6/98  bone]
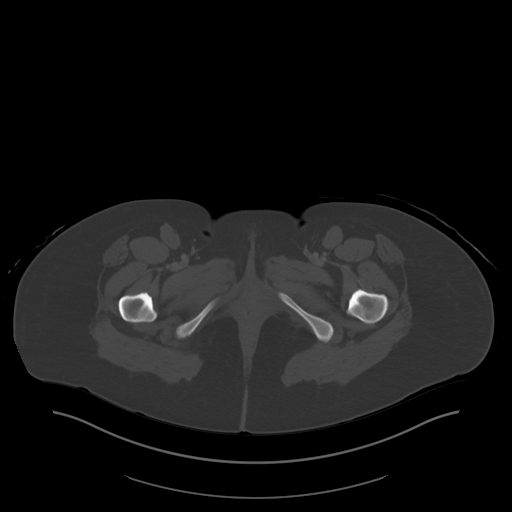
[im 11/98  soft-tissue]
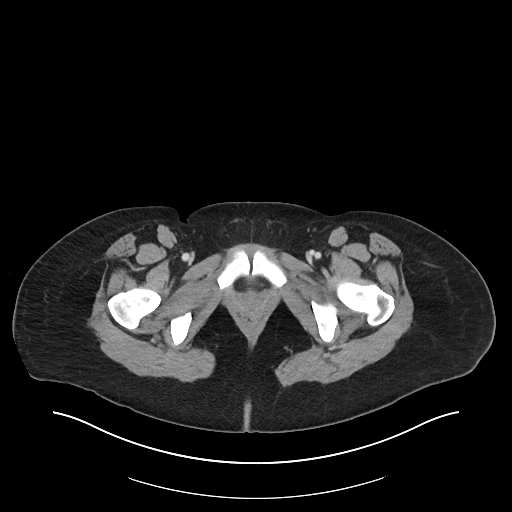
[im 22/98  soft-tissue]
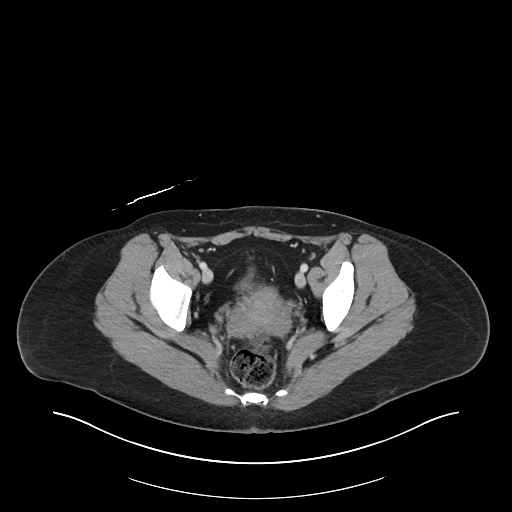
[im 27/98  soft-tissue]
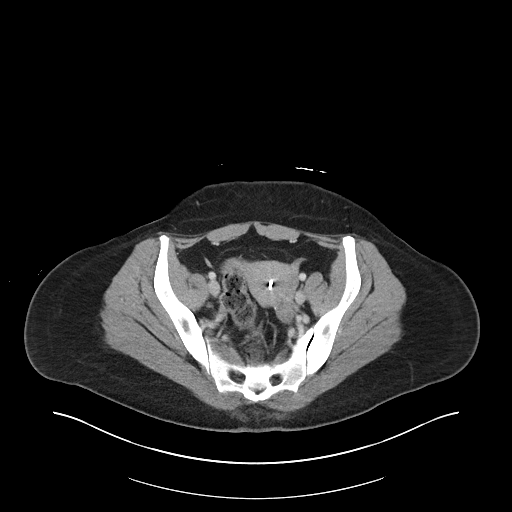
[im 33/98  soft-tissue]
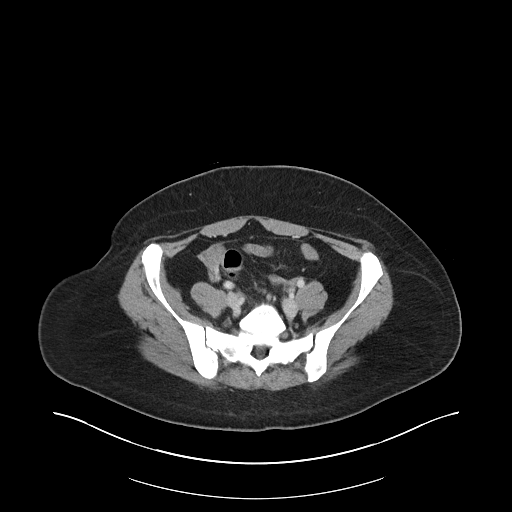
[im 44/98  soft-tissue]
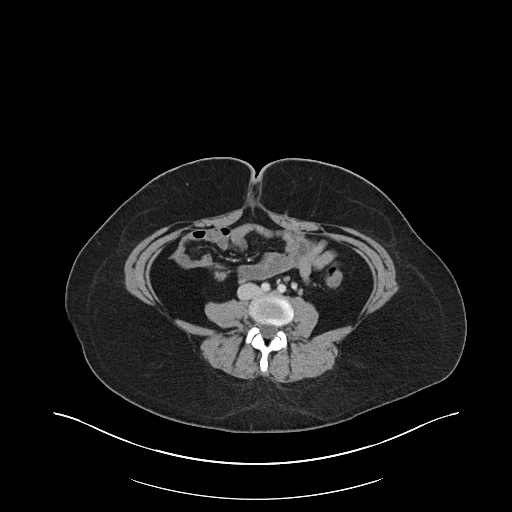
[im 49/98  soft-tissue]
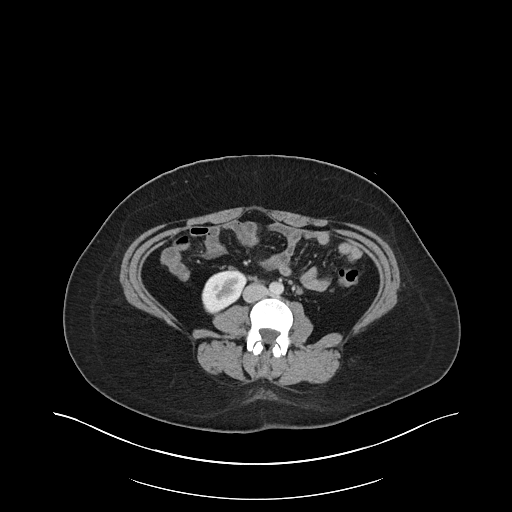
[im 54/98  soft-tissue]
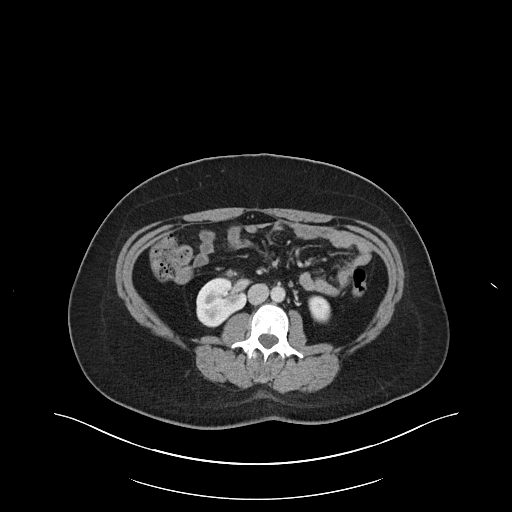
[im 65/98  soft-tissue]
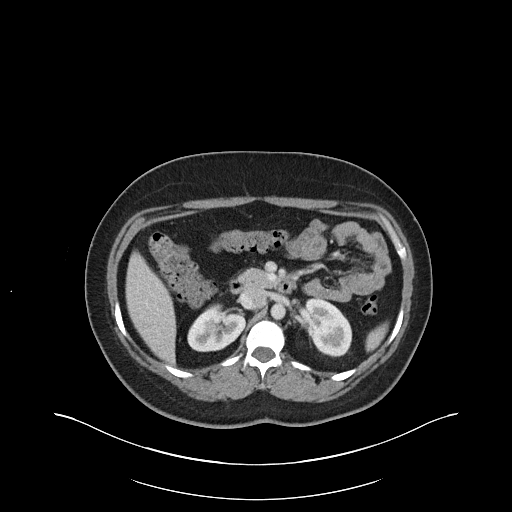
[im 65/98  bone]
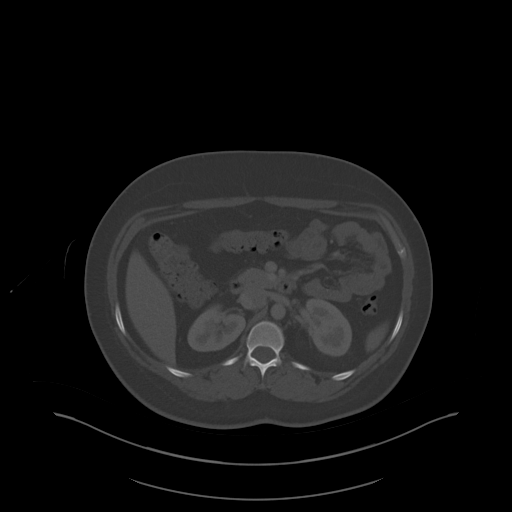
[im 71/98  soft-tissue]
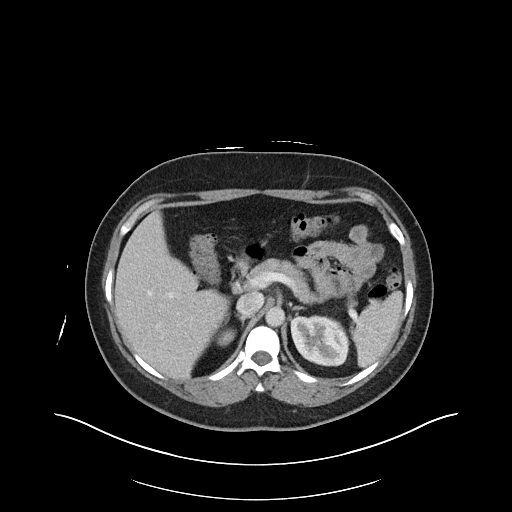
[im 76/98  soft-tissue]
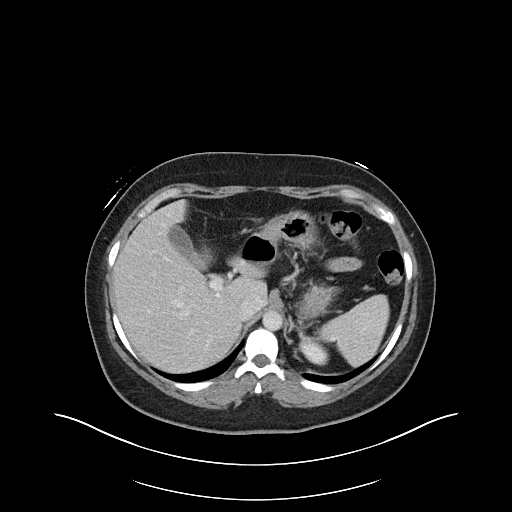
[im 87/98  soft-tissue]
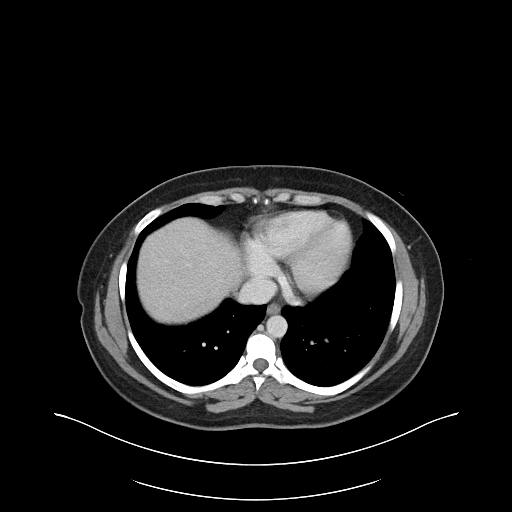
[im 92/98  soft-tissue]
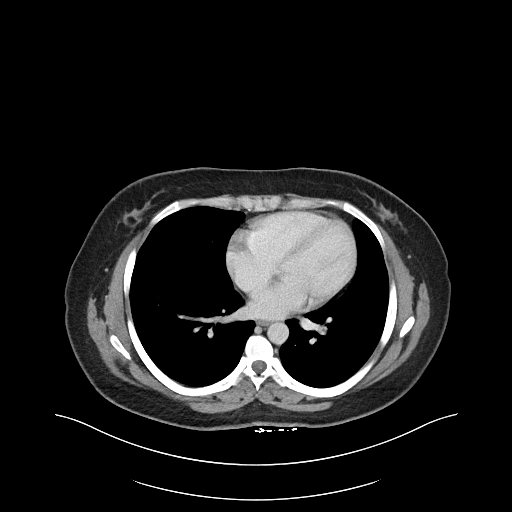

[Series 6: abdomen 3.0 mpr cor · coronal · 0.93mm/px · 3 of 98 slices shown]
[im 33/98  soft-tissue]
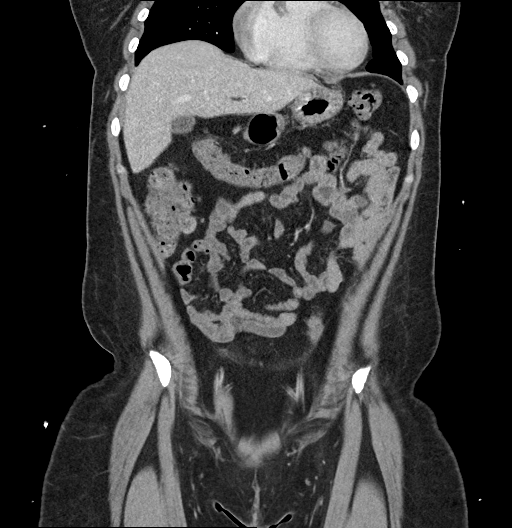
[im 44/98  soft-tissue]
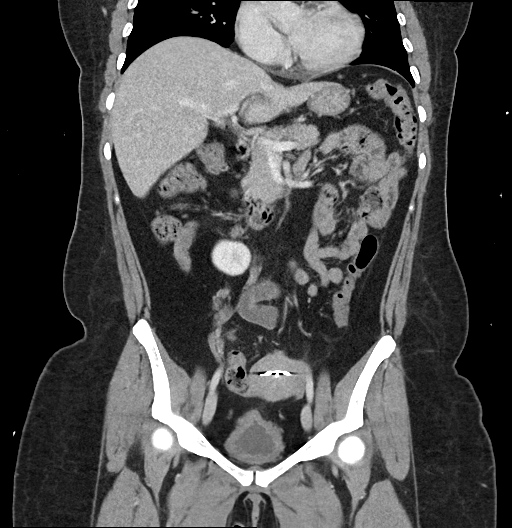
[im 54/98  soft-tissue]
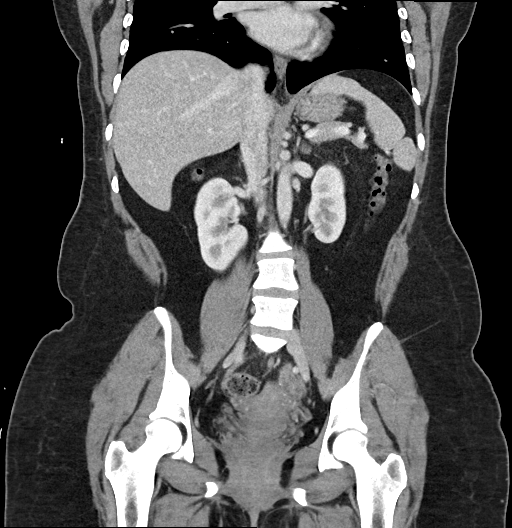

[16 of 46 positions shown; findings below may reference images not displayed]

FINDINGS: Lower chest: Lung bases are clear. Normal heart size. No pericardial
effusion.

Hepatobiliary: No worrisome focal liver lesions. Smooth liver
surface contour. Normal hepatic attenuation. Normal gallbladder and
biliary tree.

Pancreas: No pancreatic ductal dilatation or surrounding
inflammatory changes.

Spleen: Normal in size. No concerning splenic lesions.

Adrenals/Urinary Tract: Normal adrenals. Kidneys are normally
located. Ill-defined hypoattenuating focus in the left interpolar
kidney measuring approximately 14 mm in size, incompletely
characterized. No significant perinephric stranding. No other
concerning focal renal lesion. No urolithiasis or hydronephrosis.
Bladder decompressed. Bladder wall thickening with some mucosal
hyperemia, some of which could be related to underdistention though
should correlate with urinalysis particularly given the findings in
the left kidney.

Stomach/Bowel: Distal esophagus, stomach and duodenum are
unremarkable. Normal air-filled appendix in retrocecal position. No
colonic dilatation or wall thickening.

Vascular/Lymphatic: No significant vascular findings are present. No
enlarged abdominal or pelvic lymph nodes.

Reproductive: Anteverted uterus. Grossly normal positioning of a
radiodense IUD. No concerning uterine mass. No worrisome adnexal
lesions.

Other: Mild posterior body wall edema. No free abdominopelvic air or
fluid. No bowel containing hernia.

Musculoskeletal: Levocurvature lumbar spine, apex L3. No acute
osseous abnormality or suspicious osseous lesion. Musculature is
normal and symmetric.
IMPRESSION: Circumferential thickening urinary bladder, somewhat greater than
expected for underdistention with additional ill-defined region of
hypoattenuation in the interpolar left kidney. Contralateral to the
indicated site of patient discomfort. Recommend correlation with
urinalysis to exclude cystitis and ascending tract infection. Could
also consider renal ultrasound for further interrogation of this
ill-defined region of hypoattenuation incompletely characterized on
the CT images.

No other acute or worrisome CT abnormality to provide cause for
patient's right flank and abdominal pain.

## 2023-08-07 ENCOUNTER — Encounter: Payer: Self-pay | Admitting: Family Medicine

## 2023-08-07 ENCOUNTER — Ambulatory Visit: Admitting: Family Medicine

## 2023-08-07 VITALS — BP 127/71 | HR 60 | Ht 64.0 in | Wt 193.0 lb

## 2023-08-07 DIAGNOSIS — R1013 Epigastric pain: Secondary | ICD-10-CM

## 2023-08-07 DIAGNOSIS — Z8619 Personal history of other infectious and parasitic diseases: Secondary | ICD-10-CM

## 2023-08-07 DIAGNOSIS — E66811 Obesity, class 1: Secondary | ICD-10-CM | POA: Diagnosis not present

## 2023-08-07 MED ORDER — BISMUTH/METRONIDAZ/TETRACYCLIN 140-125-125 MG PO CAPS: 1.0000 | ORAL_CAPSULE | Freq: Three times a day (TID) | ORAL | 0 refills | Status: DC

## 2023-08-07 MED ORDER — PANTOPRAZOLE SODIUM 40 MG PO TBEC: 40.0000 mg | DELAYED_RELEASE_TABLET | Freq: Every day | ORAL | 3 refills | Status: DC

## 2023-08-07 MED ORDER — WEGOVY 0.25 MG/0.5ML ~~LOC~~ SOAJ: 0.2500 mg | SUBCUTANEOUS | 1 refills | Status: DC

## 2023-08-07 NOTE — Addendum Note (Signed)
 Addended by: Arville Care on: 08/07/2023 03:28 PM   Modules accepted: Orders

## 2023-08-07 NOTE — Progress Notes (Addendum)
 BP 127/71   Pulse 60   Ht 5\' 4"  (1.626 m)   Wt 193 lb (87.5 kg)   LMP 07/24/2023 (Approximate)   SpO2 99%   BMI 33.13 kg/m    Subjective:   Patient ID: Alicia Gilbert, female    DOB: [birth year], 38 y.o.   MRN: [id number]  HPI: Alicia Gilbert is a 38 y.o. female presenting on 08/07/2023 for Abdominal Pain (Mid abdominal)   HPI Patient has lower and right upper abdominal pain that is been bothering her off and on.  She says it will hurt more before she eats but sometimes it still hurts a little bit after she eats.  Does not matter what food she eats but it often hurts more before she eats if it has been a while since she has eaten.  She says she had similar problem couple years ago and we gave her medicine and it did help.  She denies any fevers or chills or nausea or vomiting or diarrhea.  Obesity and weight gain Patient would like to try medicine for obesity and weight gain.  She is heard about the injectables and she just wants to see if her insurance will cover them.  Relevant past medical, surgical, family and social history reviewed and updated as indicated. Interim medical history since our last visit reviewed. Allergies and medications reviewed and updated.  Review of Systems  Constitutional:  Negative for chills and fever.  Eyes:  Negative for visual disturbance.  Respiratory:  Negative for chest tightness and shortness of breath.   Cardiovascular:  Negative for chest pain and leg swelling.  Gastrointestinal:  Positive for abdominal pain. Negative for blood in stool, constipation, diarrhea, nausea and vomiting.  Genitourinary:  Negative for difficulty urinating, dysuria and urgency.  Musculoskeletal:  Negative for back pain and gait problem.  Skin:  Negative for rash.  Neurological:  Negative for light-headedness and headaches.  Psychiatric/Behavioral:  Negative for agitation and behavioral problems.   All other systems reviewed and are negative.   Per  HPI unless specifically indicated above   Allergies as of 08/07/2023       Reactions   Penicillins Hives, Rash   Reaction: Childhood   Shellfish Allergy Hives, Swelling        Medication List        Accurate as of August 07, 2023  3:20 PM. If you have any questions, ask your nurse or doctor.          Bismuth/Metronidaz/Tetracyclin 140-125-125 MG Caps Commonly known as: Pylera Take 1 capsule by mouth 3 (three) times daily before meals. Started by: Elige Radon Corda Shutt   cetirizine 10 MG tablet Commonly known as: ZYRTEC Take 1 tablet (10 mg total) by mouth daily.   folic acid 1 MG tablet Commonly known as: FOLVITE Take 1 mg by mouth daily.   HYDROcodone-acetaminophen 5-325 MG tablet Commonly known as: Norco Take 1 tablet by mouth every 4 (four) hours as needed for moderate pain.   multivitamin with minerals tablet Take 1 tablet by mouth daily. Woman's   pantoprazole 40 MG tablet Commonly known as: PROTONIX Take 1 tablet (40 mg total) by mouth daily. Started by: Elige Radon Kwanza Cancelliere         Objective:   BP 127/71   Pulse 60   Ht 5\' 4"  (1.626 m)   Wt 193 lb (87.5 kg)   LMP 07/24/2023 (Approximate)   SpO2 99%   BMI 33.13 kg/m   Wt Readings from Last  3 Encounters:  08/07/23 193 lb (87.5 kg)  09/09/22 188 lb (85.3 kg)  04/10/22 182 lb (82.6 kg)    Physical Exam Vitals and nursing note reviewed.  Constitutional:      General: She is not in acute distress.    Appearance: She is well-developed. She is not diaphoretic.  Eyes:     Conjunctiva/sclera: Conjunctivae normal.  Cardiovascular:     Rate and Rhythm: Normal rate and regular rhythm.     Heart sounds: Normal heart sounds. No murmur heard. Pulmonary:     Effort: Pulmonary effort is normal. No respiratory distress.     Breath sounds: Normal breath sounds. No wheezing.  Abdominal:     General: Abdomen is flat. Bowel sounds are normal. There is no distension.     Palpations: Abdomen is soft.      Tenderness: There is abdominal tenderness in the right upper quadrant and suprapubic area. There is no right CVA tenderness, left CVA tenderness, guarding or rebound.  Musculoskeletal:        General: No tenderness. Normal range of motion.  Skin:    General: Skin is warm and dry.     Findings: No rash.  Neurological:     Mental Status: She is alert and oriented to person, place, and time.     Coordination: Coordination normal.  Psychiatric:        Behavior: Behavior normal.       Assessment & Plan:   Problem List Items Addressed This Visit       Other   Abdominal pain, epigastric - Primary   Relevant Medications   Bismuth/Metronidaz/Tetracyclin (PYLERA) 140-125-125 MG CAPS   pantoprazole (PROTONIX) 40 MG tablet   History of Helicobacter pylori infection   Relevant Medications   Bismuth/Metronidaz/Tetracyclin (PYLERA) 140-125-125 MG CAPS   Other Visit Diagnoses       Obesity (BMI 30.0-34.9)           History of H. pylori and she feels like it is coming back exactly like she had before.  Will give up with a prescription for Pylera and also give her pantoprazole  Patient's BMI is >30 mg/m2.  Patient's current BMI is Body mass index is 33.13 kg/m.Marland Kitchen  Patient is currently enrolled in a healthy eating plan along with encouraged exercise.   Patient does not have a personal or family history of medullary thyroid carcinoma (MTC) or Multiple Endocrine Neoplasia syndrome type 2 (MEN 2).   Will send in a prescription for 1 month of the [facility] but she is going to lose insurance and so she is worried about that and I instructed her that she cannot start it until after her stomach calms down. Follow up plan: Return if symptoms worsen or fail to improve, for 1 to 2 months return abdominal pain and discuss weight loss.  Counseling provided for all of the vaccine components No orders of the defined types were placed in this encounter.   Arville Care, MD [facility] 08/07/2023, 3:20 PM

## 2023-08-09 ENCOUNTER — Other Ambulatory Visit (HOSPITAL_COMMUNITY): Payer: Self-pay

## 2023-08-09 ENCOUNTER — Telehealth: Payer: Self-pay | Admitting: Pharmacy Technician

## 2023-08-09 NOTE — Telephone Encounter (Signed)
 Pharmacy Patient Advocate Encounter   Received notification from CoverMyMeds that prior authorization for [facility] 0.25MG /0.5ML auto-injectors is required/requested.   Insurance verification completed.   The patient is insured through [facility] [place] .   Per test claim: PA required; PA submitted to above mentioned insurance via CoverMyMeds Key/confirmation #/EOC [id number] Status is pending

## 2023-08-10 ENCOUNTER — Other Ambulatory Visit (HOSPITAL_COMMUNITY): Payer: Self-pay

## 2023-08-10 NOTE — Telephone Encounter (Signed)
 Pharmacy Patient Advocate Encounter  Received notification from [facility] Medicaid that Prior Authorization for Wegovy 0.25MG /0.5ML auto-injectors has been APPROVED from 08/09/2023 to 02/05/2024. Ran test claim, Copay is $4.00. This test claim was processed through [facility]- copay amounts may vary at other pharmacies due to pharmacy/plan contracts, or as the patient moves through the different stages of their insurance plan.   PA #/Case ID/Reference #: [id number]

## 2023-09-07 ENCOUNTER — Ambulatory Visit: Admitting: Family Medicine

## 2023-09-07 ENCOUNTER — Encounter: Payer: Self-pay | Admitting: Family Medicine

## 2023-09-07 DIAGNOSIS — E66811 Obesity, class 1: Secondary | ICD-10-CM

## 2023-09-07 MED ORDER — WEGOVY 0.25 MG/0.5ML ~~LOC~~ SOAJ: 0.2500 mg | SUBCUTANEOUS | 1 refills | Status: DC

## 2023-09-07 NOTE — Progress Notes (Signed)
 BP 124/81   Pulse (!) 58   Ht 5\' 4"  (1.626 m)   Wt 194 lb (88 kg)   SpO2 99%   BMI 33.30 kg/m    Subjective:   Patient ID: Alicia Gilbert, female    DOB: 1985/07/10, 38 y.o.   MRN: [id number]  HPI: Alicia Gilbert is a 38 y.o. female presenting on 09/07/2023 for Medical Management of Chronic Issues, Abdominal Pain (improved), and wegiht management (? Start Wegovy )   HPI Abdominal pain follow-up and weight management Patient says that her abdominal pain is much better with the Protonix  and she is feeling a lot better.  She does want to go ahead and start the Wegovy  for weight management now that her stomach is doing better.  She says she is having regular bowel movements and everything is going well there.  She denies any diarrhea or nausea or vomiting.  She has been struggling with weight for some time and that she has been gaining weight especially since having her children.  Relevant past medical, surgical, family and social history reviewed and updated as indicated. Interim medical history since our last visit reviewed. Allergies and medications reviewed and updated.  Review of Systems  Constitutional:  Negative for chills and fever.  Eyes:  Negative for visual disturbance.  Respiratory:  Negative for chest tightness and shortness of breath.   Cardiovascular:  Negative for chest pain and leg swelling.  Musculoskeletal:  Negative for back pain and gait problem.  Skin:  Negative for rash.  Neurological:  Negative for dizziness, light-headedness and headaches.  Psychiatric/Behavioral:  Negative for agitation and behavioral problems.   All other systems reviewed and are negative.   Per HPI unless specifically indicated above   Allergies as of 09/07/2023       Reactions   Penicillins Hives, Rash   Reaction: Childhood   Shellfish Allergy Hives, Swelling        Medication List        Accurate as of Sep 07, 2023  1:34 PM. If you have any questions, ask your  nurse or doctor.          Bismuth /Metronidaz/Tetracyclin 140-125-125 MG Caps Commonly known as: Pylera  Take 1 capsule by mouth 3 (three) times daily before meals.   cetirizine  10 MG tablet Commonly known as: ZYRTEC  Take 1 tablet (10 mg total) by mouth daily.   folic acid 1 MG tablet Commonly known as: FOLVITE Take 1 mg by mouth daily.   HYDROcodone -acetaminophen  5-325 MG tablet Commonly known as: Norco Take 1 tablet by mouth every 4 (four) hours as needed for moderate pain.   multivitamin with minerals tablet Take 1 tablet by mouth daily. Woman's   pantoprazole  40 MG tablet Commonly known as: PROTONIX  Take 1 tablet (40 mg total) by mouth daily.   Wegovy  0.25 MG/0.5ML Soaj Generic drug: Semaglutide -Weight Management Inject 0.25 mg into the skin once a week.         Objective:   BP 124/81   Pulse (!) 58   Ht 5\' 4"  (1.626 m)   Wt 194 lb (88 kg)   SpO2 99%   BMI 33.30 kg/m   Wt Readings from Last 3 Encounters:  09/07/23 194 lb (88 kg)  08/07/23 193 lb (87.5 kg)  09/09/22 188 lb (85.3 kg)    Physical Exam Vitals and nursing note reviewed.  Constitutional:      General: She is not in acute distress.    Appearance: She is well-developed. She is  not diaphoretic.  Eyes:     Conjunctiva/sclera: Conjunctivae normal.  Cardiovascular:     Rate and Rhythm: Normal rate and regular rhythm.     Heart sounds: Normal heart sounds. No murmur heard. Pulmonary:     Effort: Pulmonary effort is normal. No respiratory distress.     Breath sounds: Normal breath sounds. No wheezing.  Abdominal:     General: Abdomen is flat. Bowel sounds are normal. There is no distension.     Palpations: Abdomen is soft.     Tenderness: There is no abdominal tenderness. There is no right CVA tenderness, left CVA tenderness, guarding or rebound.     Hernia: No hernia is present.  Skin:    General: Skin is warm and dry.     Findings: No rash.  Neurological:     Mental Status: She is alert  and oriented to person, place, and time.     Coordination: Coordination normal.  Psychiatric:        Behavior: Behavior normal.       Assessment & Plan:   Problem List Items Addressed This Visit   None Visit Diagnoses       Obesity (BMI 30.0-34.9)       Relevant Medications   Semaglutide -Weight Management (WEGOVY ) 0.25 MG/0.5ML SOAJ     She never did start the Wegovy  but she does want to start it now that her stomach is doing better.  Follow up plan: Return in about 3 months (around 12/08/2023), or if symptoms worsen or fail to improve, for Obesity and weight management.  Counseling provided for all of the vaccine components No orders of the defined types were placed in this encounter.   Jolyne Needs, MD [facility] 09/07/2023, 1:34 PM

## 2023-10-12 ENCOUNTER — Encounter: Payer: Self-pay | Admitting: Family Medicine

## 2023-10-12 ENCOUNTER — Telehealth: Payer: Self-pay

## 2023-10-12 ENCOUNTER — Ambulatory Visit: Payer: Self-pay

## 2023-10-12 ENCOUNTER — Ambulatory Visit: Admitting: Family Medicine

## 2023-10-12 ENCOUNTER — Other Ambulatory Visit (HOSPITAL_COMMUNITY): Payer: Self-pay

## 2023-10-12 VITALS — BP 105/61 | HR 62 | Ht 64.0 in | Wt 189.0 lb

## 2023-10-12 DIAGNOSIS — B029 Zoster without complications: Secondary | ICD-10-CM

## 2023-10-12 DIAGNOSIS — E66811 Obesity, class 1: Secondary | ICD-10-CM | POA: Diagnosis not present

## 2023-10-12 MED ORDER — SEMAGLUTIDE-WEIGHT MANAGEMENT 2.4 MG/0.75ML ~~LOC~~ SOAJ: 2.4000 mg | SUBCUTANEOUS | 0 refills | Status: AC

## 2023-10-12 MED ORDER — SEMAGLUTIDE-WEIGHT MANAGEMENT 1 MG/0.5ML ~~LOC~~ SOAJ: 1.0000 mg | SUBCUTANEOUS | 0 refills | Status: AC

## 2023-10-12 MED ORDER — SEMAGLUTIDE-WEIGHT MANAGEMENT 1.7 MG/0.75ML ~~LOC~~ SOAJ: 1.7000 mg | SUBCUTANEOUS | 0 refills | Status: AC

## 2023-10-12 MED ORDER — VALACYCLOVIR HCL 1 G PO TABS: 1000.0000 mg | ORAL_TABLET | Freq: Two times a day (BID) | ORAL | 0 refills | Status: AC

## 2023-10-12 MED ORDER — SEMAGLUTIDE-WEIGHT MANAGEMENT 0.5 MG/0.5ML ~~LOC~~ SOAJ: 0.5000 mg | SUBCUTANEOUS | 0 refills | Status: AC

## 2023-10-12 NOTE — Progress Notes (Signed)
 BP 105/61   Pulse 62   Ht 5\' 4"  (1.626 m)   Wt 189 lb (85.7 kg)   SpO2 98%   BMI 32.44 kg/m    Subjective:   Patient ID: Alicia Gilbert, female    DOB: [birth year], 38 y.o.   MRN: [id number]  HPI: Alicia Gilbert is a 39 y.o. female presenting on 10/12/2023 for Rash (Right upper arm, right lower back)   HPI Rash Patient has rash that comes around from her right flank and started out like small blisters and has now scabbed over but she has pain coming around from the right flank along her belt line to the right lower abdomen.  Relevant past medical, surgical, family and social history reviewed and updated as indicated. Interim medical history since our last visit reviewed. Allergies and medications reviewed and updated.  Review of Systems  Constitutional:  Negative for chills and fever.  Eyes:  Negative for redness and visual disturbance.  Respiratory:  Negative for chest tightness and shortness of breath.   Cardiovascular:  Negative for chest pain and leg swelling.  Musculoskeletal:  Negative for back pain and gait problem.  Skin:  Positive for rash. Negative for color change.  Neurological:  Negative for light-headedness and headaches.  Psychiatric/Behavioral:  Negative for agitation and behavioral problems.   All other systems reviewed and are negative.   Per HPI unless specifically indicated above   Allergies as of 10/12/2023       Reactions   Penicillins Hives, Rash   Reaction: Childhood   Shellfish Allergy Hives, Swelling        Medication List        Accurate as of October 12, 2023  3:52 PM. If you have any questions, ask your nurse or doctor.          STOP taking these medications    Bismuth /Metronidaz/Tetracyclin 140-125-125 MG Caps Commonly known as: Pylera  Stopped by: Lucio Sabin Angelo Prindle   folic acid 1 MG tablet Commonly known as: FOLVITE Stopped by: Lucio Sabin Taro Hidrogo   HYDROcodone -acetaminophen  5-325 MG tablet Commonly known as:  Norco Stopped by: Aycen Porreca A Uchechi Denison   multivitamin with minerals tablet Stopped by: Lucio Sabin Malaya Cagley   pantoprazole  40 MG tablet Commonly known as: PROTONIX  Stopped by: Lucio Sabin Johnae Friley       TAKE these medications    cetirizine  10 MG tablet Commonly known as: ZYRTEC  Take 1 tablet (10 mg total) by mouth daily.   valACYclovir 1000 MG tablet Commonly known as: VALTREX Take 1 tablet (1,000 mg total) by mouth 2 (two) times daily for 10 days. Started by: Lucio Sabin Hanan Moen   Wegovy  0.25 MG/0.5ML Soaj Generic drug: Semaglutide -Weight Management Inject 0.25 mg into the skin once a week.         Objective:   BP 105/61   Pulse 62   Ht 5\' 4"  (1.626 m)   Wt 189 lb (85.7 kg)   SpO2 98%   BMI 32.44 kg/m   Wt Readings from Last 3 Encounters:  10/12/23 189 lb (85.7 kg)  09/07/23 194 lb (88 kg)  08/07/23 193 lb (87.5 kg)    Physical Exam Vitals and nursing note reviewed.  Constitutional:      Appearance: Normal appearance.  Skin:    Findings: Rash present. Rash is crusting and vesicular.       Neurological:     Mental Status: She is alert.       Assessment & Plan:   Problem List Items  Addressed This Visit   None Visit Diagnoses       Herpes zoster without complication    -  Primary   Relevant Medications   valACYclovir (VALTREX) 1000 MG tablet     Start Valtrex as soon as she can.  Follow up plan: Return if symptoms worsen or fail to improve.  Counseling provided for all of the vaccine components No orders of the defined types were placed in this encounter.   Jolyne Needs, MD [facility] 10/12/2023, 3:52 PM

## 2023-10-12 NOTE — Telephone Encounter (Signed)
 Pharmacy Patient Advocate Encounter   Received notification from CoverMyMeds that prior authorization for Wegovy  1MG /0.5ML auto-injectors  is required/requested.   Insurance verification completed.   The patient is insured through [facility] .   Per test claim: PA required; PA started via CoverMyMeds. KEY [id number] . Waiting for clinical questions to populate.

## 2023-10-12 NOTE — Addendum Note (Signed)
 Addended by: Jolyne Needs on: 10/12/2023 03:53 PM   Modules accepted: Orders

## 2023-10-12 NOTE — Telephone Encounter (Signed)
 Copied from CRM [id number]. Topic: Clinical - Red Word Triage >> Oct 12, 2023 11:31 AM Albertha Alosa wrote: [facility] that prompted transfer to Nurse Triage: Patient called in stated she was bit, started off with one and now there is 6 red and swollen   FYI Only or Action Required?: FYI only for provider  Patient was last seen in primary care on 09/07/2023 by Dettinger, Lucio Sabin, MD. Called Nurse Triage reporting Insect Bite. Symptoms began a week ago. Interventions attempted: Nothing. Symptoms are: gradually worsening.  Triage Disposition: See PCP When Office is Open (Within 3 Days)  Patient/caregiver understands and will follow disposition?: Yes   Reason for Disposition  Bite starts to look bad (e.g., blister, purplish skin, ulcer)  (Exception: There is just minor swelling or small red bump.)  Answer Assessment - Initial Assessment Questions 1. TYPE of INSECT: "What type of insect was it?"      Unsure  2. ONSET: "When did you get bitten?"      8 days ago 3. LOCATION: "Where is the insect bite located?"      6 spots on the lower back  4. REDNESS: "Is the area red or pink?" If Yes, ask: "What size is area of redness?" (inches or cm). "When did the redness start?"     Yes 5. PAIN: "Is there any pain?" If Yes, ask: "How bad is it?"  (Scale 1-10; or mild, moderate, severe)     Yes, burning sensation, 0/10 now but can be 10/10 6. ITCHING: "Does it itch?" If Yes, ask: "How bad is the itch?"    - MILD: doesn't interfere with normal activities   - MODERATE-SEVERE: interferes with work, school, sleep, or other activities      No itchiness now 7. SWELLING: "How big is the swelling?" (inches, cm, or compare to coins)     Yes 8. OTHER SYMPTOMS: "Do you have any other symptoms?"  (e.g., difficulty breathing, hives)     No  Protocols used: Insect Bite-A-AH

## 2023-10-12 NOTE — Telephone Encounter (Signed)
 Appointment scheduled.

## 2023-10-13 ENCOUNTER — Other Ambulatory Visit (HOSPITAL_COMMUNITY): Payer: Self-pay

## 2023-10-13 NOTE — Telephone Encounter (Signed)
 PA approved.

## 2023-10-13 NOTE — Telephone Encounter (Signed)
 PLEASE BE ADVISED Clinical questions have been answered and PA submitted.TO PLAN. PA currently Pending.

## 2023-10-13 NOTE — Telephone Encounter (Signed)
 Pharmacy Patient Advocate Encounter  Received notification from [facility] that Prior Authorization for Wegovy  1MG /0.5ML auto-injectors  has been APPROVED from 10/13/23 to 02/05/24. Ran test claim, Copay is $4. This test claim was processed through [facility]- copay amounts may vary at other pharmacies due to pharmacy/plan contracts, or as the patient moves through the different stages of their insurance plan.   PA #/Case ID/Reference #: [id number]

## 2023-11-06 ENCOUNTER — Other Ambulatory Visit (HOSPITAL_COMMUNITY): Payer: Self-pay

## 2023-11-06 ENCOUNTER — Telehealth: Payer: Self-pay

## 2023-11-06 NOTE — Telephone Encounter (Signed)
 Pharmacy Patient Advocate Encounter   Received notification from CoverMyMeds that prior authorization for Wegovy  is required/requested. Patient already has prior authorization for this medication.   Insurance verification completed.   The patient is insured through [facility] .   Per test claim: Refill too soon, last filled 11/05/23.

## 2024-03-14 ENCOUNTER — Ambulatory Visit (INDEPENDENT_AMBULATORY_CARE_PROVIDER_SITE_OTHER): Payer: Self-pay | Admitting: Family Medicine

## 2024-03-14 ENCOUNTER — Encounter: Payer: Self-pay | Admitting: Family Medicine

## 2024-03-14 VITALS — BP 106/67 | Temp 97.9°F | Ht 64.0 in | Wt 181.0 lb

## 2024-03-14 DIAGNOSIS — N644 Mastodynia: Secondary | ICD-10-CM | POA: Diagnosis not present

## 2024-03-14 DIAGNOSIS — E66811 Obesity, class 1: Secondary | ICD-10-CM | POA: Diagnosis not present

## 2024-03-14 DIAGNOSIS — Z131 Encounter for screening for diabetes mellitus: Secondary | ICD-10-CM

## 2024-03-14 DIAGNOSIS — K219 Gastro-esophageal reflux disease without esophagitis: Secondary | ICD-10-CM | POA: Diagnosis not present

## 2024-03-14 LAB — LIPID PANEL

## 2024-03-14 MED ORDER — SEMAGLUTIDE-WEIGHT MANAGEMENT 0.5 MG/0.5ML ~~LOC~~ SOAJ: 0.5000 mg | SUBCUTANEOUS | 0 refills | Status: DC

## 2024-03-14 MED ORDER — SEMAGLUTIDE-WEIGHT MANAGEMENT 1.7 MG/0.75ML ~~LOC~~ SOAJ: 1.7000 mg | SUBCUTANEOUS | 0 refills | Status: DC

## 2024-03-14 MED ORDER — SEMAGLUTIDE-WEIGHT MANAGEMENT 2.4 MG/0.75ML ~~LOC~~ SOAJ: 2.4000 mg | SUBCUTANEOUS | 0 refills | Status: DC

## 2024-03-14 MED ORDER — SEMAGLUTIDE-WEIGHT MANAGEMENT 1 MG/0.5ML ~~LOC~~ SOAJ: 1.0000 mg | SUBCUTANEOUS | 0 refills | Status: DC

## 2024-03-14 NOTE — Progress Notes (Signed)
 BP 106/67   Temp 97.9 F (36.6 C)   Ht 5' 4 (1.626 m)   Wt 181 lb (82.1 kg)   SpO2 100%   BMI 31.07 kg/m    Subjective:   Patient ID: Alicia Gilbert, female    DOB: 10-19-1985, 38 y.o.   MRN: [id number]  HPI: Alicia Gilbert is a 38 y.o. female presenting on 03/14/2024 for Medical Management of Chronic Issues   Discussed the use of AI scribe software for clinical note transcription with the patient, who gave verbal consent to proceed.  History of Present Illness   Alicia Gilbert is a 38 year old female who presents with bilateral breast pain and difficulty obtaining weight loss medication.  Bilateral mastalgia - Significant bilateral breast pain for the past week - Onset approximately one week after last menstrual period - Pain is persistent and worsened by sleeping on her side - No recent trauma, dietary changes, or changes in sexual activity - Reduced caffeine intake - Uses a contraceptive device and denies possibility of pregnancy - Typically experiences breast pain two days before menstrual period, but this episode is unusual as it started post-menstruation  Weight management difficulties - Difficulty obtaining weight loss medication - Previously prescribed Wegovy  for weight loss, taken for two months - No significant weight loss observed during treatment; maintained weight at approximately 181 pounds on 0.2 mg dose - Issues obtaining medication from pharmacy due to missing prescription - Unable to continue Wegovy  since loss of Medicaid coverage - Plans to reapply for Medicaid due to current financial situation, recently divorced, and 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 Interim medical history since our last visit reviewed. Allergies and medications reviewed and updated.  Review of Systems  Constitutional:  Negative for chills and fever.   HENT:  Negative for congestion, ear discharge and ear pain.   Eyes:  Negative for redness and visual disturbance.  Respiratory:  Negative for chest tightness and shortness of breath.   Cardiovascular:  Positive for chest pain (Breast pain). Negative for leg swelling.  Genitourinary:  Negative for difficulty urinating and dysuria.  Musculoskeletal:  Negative for back pain and gait problem.  Skin:  Negative for rash.  Neurological:  Negative for light-headedness and headaches.  Psychiatric/Behavioral:  Negative for agitation and behavioral problems.   All other systems reviewed and are negative.   Per HPI unless specifically indicated above   Allergies as of 03/14/2024       Reactions   Penicillins Hives, Rash   Reaction: Childhood   Shellfish Allergy Hives, Swelling        Medication List        Accurate as of March 14, 2024  2:09 PM. If you have any questions, ask your nurse or doctor.          cetirizine  10 MG tablet Commonly known as: ZYRTEC  Take 1 tablet (10 mg total) by mouth daily.   semaglutide -weight management 0.5 MG/0.5ML Soaj SQ injection Commonly known as: WEGOVY  Inject 0.5 mg into the skin once a week for 28 days. Start taking on: April 12, 2024 Started by: Fonda LABOR Daric Koren   semaglutide -weight management 1 MG/0.5ML Soaj SQ injection Commonly known as: WEGOVY  Inject 1 mg into the skin once a week for 28 days. Start taking on: May 11, 2024 Started by: Fonda LABOR Jairon Ripberger   semaglutide -weight management 1.7 MG/0.75ML  Soaj SQ injection Commonly known as: WEGOVY  Inject 1.7 mg into the skin once a week for 28 days. Start taking on: June 09, 2024 Started by: Fonda LABOR Unita Detamore   semaglutide -weight management 2.4 MG/0.75ML Soaj SQ injection Commonly known as: WEGOVY  Inject 2.4 mg into the skin once a week for 28 days. Start taking on: July 08, 2024 Started by: Fonda A Termaine Roupp         Objective:   BP 106/67   Temp 97.9 F (36.6  C)   Ht 5' 4 (1.626 m)   Wt 181 lb (82.1 kg)   SpO2 100%   BMI 31.07 kg/m   Wt Readings from Last 3 Encounters:  03/14/24 181 lb (82.1 kg)  10/12/23 189 lb (85.7 kg)  09/07/23 194 lb (88 kg)    Physical Exam Physical Exam   MEASUREMENTS: Weight- 181.         Assessment & Plan:   Problem List Items Addressed This Visit       Digestive   Gastroesophageal reflux disease - Primary   Other Visit Diagnoses       Obesity (BMI 30.0-34.9)       Relevant Medications   semaglutide -weight management (WEGOVY ) 0.5 MG/0.5ML SOAJ SQ injection (Start on 04/12/2024)   semaglutide -weight management (WEGOVY ) 1 MG/0.5ML SOAJ SQ injection (Start on 05/11/2024)   semaglutide -weight management (WEGOVY ) 1.7 MG/0.75ML SOAJ SQ injection (Start on 06/09/2024)   semaglutide -weight management (WEGOVY ) 2.4 MG/0.75ML SOAJ SQ injection (Start on 07/08/2024)   Other Relevant Orders   CBC With Diff/Platelet   CMP14+EGFR   Lipid panel   TSH     Diabetes mellitus screening       Relevant Orders   CBC With Diff/Platelet   CMP14+EGFR   Lipid panel   TSH     Breast pain in female       Relevant Orders   MM Digital Diagnostic Bilat          Bilateral breast pain (mastodynia) Breast pain reported without recent menstrual or sexual activity changes to suggest hormonal imbalance.  Obesity Wegovy  management interrupted due to Medicaid loss. No significant weight loss on 0.2 mg dose. Discussed medication cost and insurance need. - Reapply for Medicaid to resume Wegovy  prescription.  General Health Maintenance Routine blood work overdue. Discussed importance of cholesterol, thyroid , and diabetes testing. - Ordered blood tests for cholesterol, thyroid , and diabetes. - Advised to return in 2-3 months for follow-up if insurance coverage is needed.          Follow up plan: Return in about 3 months (around 06/14/2024), or if symptoms worsen or fail to improve, for Obesity and weight.  Counseling provided  for all of the vaccine components Orders Placed This Encounter  Procedures   MM Digital Diagnostic Bilat   CBC With Diff/Platelet   CMP14+EGFR   Lipid panel   TSH    Fonda Levins, MD [facility] 03/14/2024, 2:09 PM

## 2024-03-15 LAB — CMP14+EGFR
ALT: 9 IU/L (ref 0–32)
AST: 13 IU/L (ref 0–40)
Albumin: 4 g/dL (ref 3.9–4.9)
Alkaline Phosphatase: 44 IU/L (ref 41–116)
BUN/Creatinine Ratio: 23 (ref 9–23)
BUN: 18 mg/dL (ref 6–20)
Bilirubin Total: <0.2 mg/dL (ref 0.0–1.2)
CO2: 20 mmol/L (ref 20–29)
Calcium: 8.8 mg/dL (ref 8.7–10.2)
Chloride: 106 mmol/L (ref 96–106)
Creatinine, Ser: 0.8 mg/dL (ref 0.57–1.00)
Globulin, Total: 2.7 g/dL (ref 1.5–4.5)
Glucose: 80 mg/dL (ref 70–99)
Potassium: 3.8 mmol/L (ref 3.5–5.2)
Sodium: 139 mmol/L (ref 134–144)
Total Protein: 6.7 g/dL (ref 6.0–8.5)
eGFR: 97 mL/min/1.73 (ref >59)

## 2024-03-15 LAB — LIPID PANEL
Cholesterol, Total: 183 mg/dL (ref 100–199)
HDL: 53 mg/dL (ref >39)
LDL CALC COMMENT:: 3.5 ratio (ref 0.0–4.4)
LDL Chol Calc (NIH): 109 mg/dL — AB (ref 0–99)
Triglycerides: 119 mg/dL (ref 0–149)
VLDL Cholesterol Cal: 21 mg/dL (ref 5–40)

## 2024-03-15 LAB — CBC WITH DIFF/PLATELET
Basophils Absolute: 0 x10E3/uL (ref 0.0–0.2)
Basos: 1 %
EOS (ABSOLUTE): 0.1 x10E3/uL (ref 0.0–0.4)
Eos: 1 %
Hematocrit: 37.4 % (ref 34.0–46.6)
Hemoglobin: 12.3 g/dL (ref 11.1–15.9)
Immature Grans (Abs): 0 x10E3/uL (ref 0.0–0.1)
Immature Granulocytes: 0 %
Lymphocytes Absolute: 1.9 x10E3/uL (ref 0.7–3.1)
Lymphs: 29 %
MCH: 32.1 pg (ref 26.6–33.0)
MCHC: 32.9 g/dL (ref 31.5–35.7)
MCV: 98 fL — ABNORMAL HIGH (ref 79–97)
Monocytes Absolute: 0.4 x10E3/uL (ref 0.1–0.9)
Monocytes: 7 %
Neutrophils Absolute: 4.1 x10E3/uL (ref 1.4–7.0)
Neutrophils: 62 %
Platelets: 233 x10E3/uL (ref 150–450)
RBC: 3.83 x10E6/uL (ref 3.77–5.28)
RDW: 11.9 % (ref 11.7–15.4)
WBC: 6.6 x10E3/uL (ref 3.4–10.8)

## 2024-03-15 LAB — TSH: TSH: 1.91 u[IU]/mL (ref 0.450–4.500)

## 2024-03-18 ENCOUNTER — Other Ambulatory Visit: Payer: Self-pay

## 2024-03-18 DIAGNOSIS — N644 Mastodynia: Secondary | ICD-10-CM

## 2024-03-21 ENCOUNTER — Ambulatory Visit: Payer: Self-pay | Admitting: Family Medicine

## 2024-03-25 ENCOUNTER — Other Ambulatory Visit: Payer: Self-pay | Admitting: Family Medicine

## 2024-03-25 DIAGNOSIS — E66811 Obesity, class 1: Secondary | ICD-10-CM

## 2024-03-25 NOTE — Telephone Encounter (Signed)
 Copied from CRM #[id number]. Topic: Clinical - Medication Refill >> Mar 25, 2024 11:15 AM Montie POUR wrote: Medication:  semaglutide -weight management (WEGOVY ) 2.4 MG/0.75ML SOAJ SQ injection Caressa now has Medicaid. Please call in medication.   Has the patient contacted their pharmacy? Yes (Agent: If no, request that the patient contact the pharmacy for the refill. If patient does not wish to contact the pharmacy document the reason why and proceed with request.) (Agent: If yes, when and what did the pharmacy advise?) Pharmacy needs order to refill  This is the patient's preferred pharmacy:  [id number] - [place], [place] - [address] [place] [address] [place] [address] [place]  [address] Phone: [phone] Fax: [phone]  Is this the correct pharmacy for this prescription? Yes If no, delete pharmacy and type the correct one.   Has the prescription been filled recently? No  Is the patient out of the medication? Yes  Has the patient been seen for an appointment in the last year OR does the patient have an upcoming appointment? Yes  Can we respond through MyChart? No  Agent: Please be advised that Rx refills may take up to 3 business days. We ask that you follow-up with your pharmacy.

## 2024-03-29 MED ORDER — SEMAGLUTIDE-WEIGHT MANAGEMENT 2.4 MG/0.75ML ~~LOC~~ SOAJ: 2.4000 mg | SUBCUTANEOUS | 0 refills | Status: DC

## 2024-04-02 ENCOUNTER — Telehealth: Payer: Self-pay | Admitting: Family Medicine

## 2024-04-02 NOTE — Telephone Encounter (Signed)
 Looking at her current medication list, all of her Wegovy  Rx's have [facility].    Copied from CRM #[id number]. Topic: Clinical - Medication Question >> Apr 01, 2024  5:00 PM Selinda RAMAN wrote: Reason for CRM: The patient called in along with her daughter Hershel stating she spoke with the insurance and although she was told they were not going to cover her Wegovy  she found out that they will. Can a prescription can be called into her pharmacy  [phone] [place], [place] - [address]  Phone: [phone] Fax: [phone]   She would appreciate it and if there are any questions please contact the patient

## 2024-04-08 NOTE — Telephone Encounter (Signed)
 Pt states that all the prescriptions need to be sent back in to the pharmacy now that the insurance will cover it.

## 2024-04-09 ENCOUNTER — Ambulatory Visit (HOSPITAL_COMMUNITY): Payer: Self-pay | Attending: Family Medicine

## 2024-04-09 ENCOUNTER — Encounter (HOSPITAL_COMMUNITY): Payer: Self-pay

## 2024-04-09 ENCOUNTER — Ambulatory Visit (HOSPITAL_COMMUNITY): Admission: RE | Admit: 2024-04-09 | Payer: Self-pay | Source: Ambulatory Visit

## 2024-04-09 ENCOUNTER — Inpatient Hospital Stay (HOSPITAL_COMMUNITY): Admission: RE | Admit: 2024-04-09 | Payer: Self-pay | Source: Ambulatory Visit

## 2024-04-10 ENCOUNTER — Other Ambulatory Visit: Payer: Self-pay

## 2024-04-10 DIAGNOSIS — E66811 Obesity, class 1: Secondary | ICD-10-CM

## 2024-04-10 MED ORDER — SEMAGLUTIDE-WEIGHT MANAGEMENT 1.7 MG/0.75ML ~~LOC~~ SOAJ: 1.7000 mg | SUBCUTANEOUS | 0 refills | Status: AC

## 2024-04-10 MED ORDER — SEMAGLUTIDE-WEIGHT MANAGEMENT 1 MG/0.5ML ~~LOC~~ SOAJ: 1.0000 mg | SUBCUTANEOUS | 0 refills | Status: AC

## 2024-04-10 MED ORDER — SEMAGLUTIDE-WEIGHT MANAGEMENT 2.4 MG/0.75ML ~~LOC~~ SOAJ: 2.4000 mg | SUBCUTANEOUS | 0 refills | Status: AC

## 2024-04-10 MED ORDER — SEMAGLUTIDE-WEIGHT MANAGEMENT 0.5 MG/0.5ML ~~LOC~~ SOAJ: 0.5000 mg | SUBCUTANEOUS | 0 refills | Status: AC

## 2024-04-10 NOTE — Telephone Encounter (Signed)
 I think they only covered under certain situations including if somebody has sleep apnea which she does not have but if she does want to try for it she will need to come in for a visit because it has been more than 7 months anyways and we will have to do an assessment and document everything in our note and then try to send for it.  Please make her an appointment ASAP

## 2024-04-10 NOTE — Telephone Encounter (Signed)
 Will send in Rx as requested. If insurance still will not cover then an appt needs to be made asap with Dettinger to discuss and order possible sleep study

## 2024-04-23 ENCOUNTER — Ambulatory Visit: Payer: Self-pay

## 2024-04-23 NOTE — Telephone Encounter (Addendum)
 Copied from CRM #[id number]. Topic: Clinical - Red Word Triage >> Apr 23, 2024  2:37 PM Rachelle R wrote: Red Word that prompted transfer to Nurse Triage: Patient states she started having symptoms, thinks she has an infection in throat, headache, and pain in her right ear.  Call dropped while on the phone with interpreter. Interpreter did try to call her back but unable to leave a message. Will place in callbacks.  Reason for Disposition  Earache also present  Answer Assessment - Initial Assessment Questions 1. ONSET: When did the throat start hurting? (Hours or days ago)      Started yesterday 2. SEVERITY: How bad is the sore throat? (Scale 1-10; mild, moderate or severe)     moderate 3. STREP EXPOSURE: Has there been any exposure to strep within the past week? If Yes, ask: What type of contact occurred?      no 4.  VIRAL SYMPTOMS: Are there any symptoms of a cold, such as a runny nose, cough, hoarse voice or red eyes?      no 5. FEVER: Do you have a fever? If Yes, ask: What is your temperature, how was it measured, and when did it start?     Yes-102.3 yesterday 6. PUS ON THE TONSILS: Is there pus on the tonsils in the back of your throat?     unknown 7. OTHER SYMPTOMS: Do you have any other symptoms? (e.g., difficulty breathing, headache, rash)     Ear pain, headache 8. PREGNANCY: Is there any chance you are pregnant? When was your last menstrual period?     no  Protocols used: Sore Throat-A-AH

## 2024-04-24 ENCOUNTER — Ambulatory Visit (INDEPENDENT_AMBULATORY_CARE_PROVIDER_SITE_OTHER): Admitting: Family Medicine

## 2024-04-24 ENCOUNTER — Encounter: Payer: Self-pay | Admitting: Family Medicine

## 2024-04-24 VITALS — BP 106/68 | HR 67 | Temp 98.3°F | Ht 64.0 in | Wt 183.4 lb

## 2024-04-24 DIAGNOSIS — J069 Acute upper respiratory infection, unspecified: Secondary | ICD-10-CM | POA: Diagnosis not present

## 2024-04-24 DIAGNOSIS — R509 Fever, unspecified: Secondary | ICD-10-CM | POA: Diagnosis not present

## 2024-04-24 LAB — VERITOR SARS-COV-2 AND FLU A+B
BD Veritor SARS-CoV-2 Ag: NEGATIVE
Influenza A: NEGATIVE
Influenza B: NEGATIVE

## 2024-04-24 MED ORDER — CHLORPHEN-PE-ACETAMINOPHEN 4-10-325 MG PO TABS: 1.0000 | ORAL_TABLET | Freq: Four times a day (QID) | ORAL | 0 refills | Status: AC | PRN

## 2024-04-24 NOTE — Telephone Encounter (Signed)
 Appt scheduled 04/24/2024

## 2024-04-24 NOTE — Progress Notes (Signed)
 Acute Office Visit  Subjective:     Patient ID: Alicia Gilbert, female    DOB: March 09, 1986, 38 y.o.   MRN: [id number]  Chief Complaint  Patient presents with   Fever    Fever     History of Present Illness   Alicia Gilbert is a 38 year old female who presents with nasal congestion, sore throat, right ear pain, and fever.  Upper respiratory symptoms - Nasal congestion began two days ago in the afternoon and has persisted - Sore throat onset yesterday - No shortness of breath or wheezing  Otalgia - Right ear pain is intermittent, described as 'comes and goes' - No left ear pain - Used natural herbs in right ear for symptom relief, which alleviated pain  Fever and constitutional symptoms - Fever began yesterday, peaked at 102.21F - Mild body aches present - No chills - No nausea, vomiting, stomach pain, or diarrhea  Exposure history - No recent contact with anyone who is sick  Symptom management - Has not taken any over-the-counter medications for symptoms       Review of Systems  Constitutional:  Positive for fever.   As per HPI.      Objective:    BP 106/68   Pulse 67   Temp 98.3 F (36.8 C) (Temporal)   Ht 5' 4 (1.626 m)   Wt 183 lb 6.4 oz (83.2 kg)   SpO2 96%   BMI 31.48 kg/m    Physical Exam Vitals and nursing note reviewed.  Constitutional:      General: She is not in acute distress.    Appearance: She is not ill-appearing, toxic-appearing or diaphoretic.  HENT:     Right Ear: Tympanic membrane, ear canal and external ear normal.     Left Ear: Tympanic membrane, ear canal and external ear normal.     Nose: Congestion present.     Right Sinus: No maxillary sinus tenderness or frontal sinus tenderness.     Left Sinus: No maxillary sinus tenderness or frontal sinus tenderness.     Mouth/Throat:     Mouth: Mucous membranes are moist.     Pharynx: Oropharynx is clear.     Tonsils: No tonsillar exudate or tonsillar abscesses. 0  on the right. 0 on the left.  Eyes:     General:        Right eye: No discharge.        Left eye: No discharge.     Conjunctiva/sclera: Conjunctivae normal.  Cardiovascular:     Rate and Rhythm: Normal rate and regular rhythm.     Pulses: Normal pulses.     Heart sounds: Normal heart sounds. No murmur heard. Pulmonary:     Effort: Pulmonary effort is normal. No respiratory distress.     Breath sounds: Normal breath sounds. No wheezing or rhonchi.  Abdominal:     General: Bowel sounds are normal. There is no distension.     Palpations: Abdomen is soft. There is no mass.     Tenderness: There is no abdominal tenderness. There is no guarding or rebound.  Musculoskeletal:     Cervical back: Neck supple. No tenderness.     Right lower leg: No edema.     Left lower leg: No edema.  Lymphadenopathy:     Cervical: No cervical adenopathy.  Skin:    General: Skin is warm and dry.  Neurological:     General: No focal deficit present.     Mental Status:  She is alert and oriented to person, place, and time.  Psychiatric:        Mood and Affect: Mood normal.        Behavior: Behavior normal.     No results found for any visits on 04/24/24.      Assessment & Plan:   Kingsley was seen today for fever.  Diagnoses and all orders for this visit:  Fever, unspecified fever cause -     Veritor SARS-CoV-2 and Flu A+B  Acute URI -     Chlorphen-PE-Acetaminophen  4-10-325 MG TABS; Take 1 tablet by mouth every 6 (six) hours as needed.   Assessment and Plan    Acute upper respiratory infection Negative covid, flu A, and flu B today. Likely viral etiology, expected to resolve with supportive care. - Prescribed Noral AD (generic) with acetaminophen , antihistamine, and decongestant. Available OTC if not covered. - Advised Noral AD every 4-6 hours as needed for symptoms. - Encouraged rest and increased fluids. - Instructed to return if fever persists after 2-3 days, worsening symptoms, or no  improvement after one week.      Return to office for new or worsening symptoms, or if symptoms persist.   The patient indicates understanding of these issues and agrees with the plan.  Alicia CHRISTELLA Search, FNP

## 2024-06-13 ENCOUNTER — Encounter: Payer: Self-pay | Admitting: Family Medicine

## 2024-06-14 ENCOUNTER — Ambulatory Visit

## 2024-06-20 ENCOUNTER — Ambulatory Visit
